# Patient Record
Sex: Male | Born: 1945 | Race: White | Hispanic: No | Marital: Married | Smoking: Never smoker
Health system: Southern US, Community
[De-identification: ages and names within clinical notes are randomized; demographics above are authoritative.]

## PROBLEM LIST (undated history)

## (undated) DIAGNOSIS — I255 Ischemic cardiomyopathy: Secondary | ICD-10-CM

## (undated) DIAGNOSIS — I10 Essential (primary) hypertension: Secondary | ICD-10-CM

## (undated) DIAGNOSIS — M1611 Unilateral primary osteoarthritis, right hip: Secondary | ICD-10-CM

## (undated) DIAGNOSIS — I340 Nonrheumatic mitral (valve) insufficiency: Secondary | ICD-10-CM

## (undated) DIAGNOSIS — E785 Hyperlipidemia, unspecified: Secondary | ICD-10-CM

## (undated) DIAGNOSIS — J069 Acute upper respiratory infection, unspecified: Secondary | ICD-10-CM

## (undated) DIAGNOSIS — I2510 Atherosclerotic heart disease of native coronary artery without angina pectoris: Secondary | ICD-10-CM

## (undated) HISTORY — DX: Ischemic cardiomyopathy: I25.5

## (undated) HISTORY — DX: Nonrheumatic mitral (valve) insufficiency: I34.0

## (undated) HISTORY — PX: JOINT REPLACEMENT: SHX530

## (undated) HISTORY — PX: VASECTOMY: SHX75

## (undated) SURGERY — Surgical Case
Anesthesia: *Unknown

---
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
## 2011-08-15 ENCOUNTER — Encounter (HOSPITAL_COMMUNITY): Payer: Self-pay | Admitting: Pharmacy Technician

## 2011-08-17 ENCOUNTER — Encounter (HOSPITAL_COMMUNITY): Payer: Self-pay

## 2011-08-17 ENCOUNTER — Encounter (HOSPITAL_COMMUNITY)
Admission: RE | Admit: 2011-08-17 | Discharge: 2011-08-17 | Disposition: A | Discharge status: Home / Self Care | Payer: Medicare Other | Source: Ambulatory Visit | Attending: Orthopaedic Surgery | Admitting: Orthopaedic Surgery

## 2011-08-17 ENCOUNTER — Encounter (HOSPITAL_COMMUNITY)
Admission: RE | Admit: 2011-08-17 | Discharge: 2011-08-17 | Disposition: A | Discharge status: Home / Self Care | Payer: Medicare Other | Source: Ambulatory Visit | Attending: Orthopedic Surgery | Admitting: Orthopedic Surgery

## 2011-08-17 ENCOUNTER — Other Ambulatory Visit: Payer: Self-pay | Admitting: Orthopedic Surgery

## 2011-08-17 ENCOUNTER — Other Ambulatory Visit: Payer: Self-pay

## 2011-08-17 HISTORY — DX: Essential (primary) hypertension: I10

## 2011-08-17 HISTORY — DX: Acute upper respiratory infection, unspecified: J06.9

## 2011-08-17 LAB — COMPREHENSIVE METABOLIC PANEL
ALT: 13 U/L (ref 0–53)
Alkaline Phosphatase: 78 U/L (ref 39–117)
BUN: 26 mg/dL — ABNORMAL HIGH (ref 6–23)
CO2: 28 mEq/L (ref 19–32)
GFR calc Af Amer: >90 mL/min (ref >90)
GFR calc non Af Amer: 85 mL/min — ABNORMAL LOW (ref >90)
Glucose, Bld: 79 mg/dL (ref 70–99)
Potassium: 4.5 mEq/L (ref 3.5–5.1)
Sodium: 141 mEq/L (ref 135–145)
Total Bilirubin: 0.3 mg/dL (ref 0.3–1.2)

## 2011-08-17 LAB — CBC
HCT: 40.3 % (ref 39.0–52.0)
Hemoglobin: 13.9 g/dL (ref 13.0–17.0)
RBC: 4.43 MIL/uL (ref 4.22–5.81)

## 2011-08-17 LAB — URINALYSIS, ROUTINE W REFLEX MICROSCOPIC
Bilirubin Urine: NEGATIVE
Glucose, UA: NEGATIVE mg/dL
Nitrite: NEGATIVE
Specific Gravity, Urine: 1.031 — ABNORMAL HIGH (ref 1.005–1.030)
pH: 5.5 (ref 5.0–8.0)

## 2011-08-17 LAB — TYPE AND SCREEN

## 2011-08-17 LAB — DIFFERENTIAL
Basophils Absolute: 0 10*3/uL (ref 0.0–0.1)
Basophils Relative: 0 % (ref 0–1)
Eosinophils Absolute: 0.2 10*3/uL (ref 0.0–0.7)
Eosinophils Relative: 2 % (ref 0–5)
Neutrophils Relative %: 68 % (ref 43–77)

## 2011-08-17 LAB — PROTIME-INR
INR: 1.02 (ref 0.00–1.49)
Prothrombin Time: 13.6 seconds (ref 11.6–15.2)

## 2011-08-17 LAB — APTT: aPTT: 26 seconds (ref 24–37)

## 2011-08-17 NOTE — Pre-Procedure Instructions (Signed)
[name]  08/17/2011   Your procedure is scheduled on:  08-30-11 Tuesday  Report to [facility] at 6:45 AM.  Call this number if you have problems the morning of surgery: [phone]   Remember:   Do not eat food:After Midnight.  May have clear liquids: up to 4 Hours before arrival.  Clear liquids include soda, tea, black coffee, apple or grape juice, broth.  Take these medicines the morning of surgery with A SIP OF WATER: none   Do not wear jewelry, make-up or nail polish.  Do not wear lotions, powders, or perfumes. You may wear deodorant.  Do not shave 48 hours prior to surgery.  Do not bring valuables to the hospital.  Contacts, dentures or bridgework may not be worn into surgery.  Leave suitcase in the car. After surgery it may be brought to your room.  For patients admitted to the hospital, checkout time is 11:00 AM the day of discharge.   Patients discharged the day of surgery will not be allowed to drive home.  Name and phone number of your driver: paton crum - wife  [phone]  Special Instructions: Incentive Spirometry - Practice and bring it with you on the day of surgery. and CHG Shower Use Special Wash: 1/2 bottle night before surgery and 1/2 bottle morning of surgery.   Please read over the following fact sheets that you were given: Pain Booklet, Coughing and Deep Breathing, Blood Transfusion Information, MRSA Information and Surgical Site Infection Prevention

## 2011-08-18 LAB — URINE CULTURE
Colony Count: NO GROWTH
Culture  Setup Time: 201212051648

## 2011-08-24 NOTE — H&P (Addendum)
COMPLAINT:     Painful right hip.  HPI:     Leonard Rodriguez is a very pleasant 65 year old white married male who is seen today for evaluation of his right hip.  He has been having problems for at least the last three to four years in regards to his right hip.  He has had a considerable amount of pain in the right groin with some radiation down in the anterior thigh and down to his knee.  He denies any history of any injury or trauma, but certainly as a youth he was quite active.  He has had the pain in the area which is now moderately severe and more of an aching pain.  He is having difficulty now with sleeping and also with any type of activity.  He is not really able to golf or water ski secondary to this pain.  He feels as if he is having shortening of the right leg.  He tends to waddle some with walking.  He has had some benefit with rest and Aleve.  He is seen today for evaluation.  MEDICATIONS:     He is taking Aleve three in the morning, one at noon, and one in the evening.  He has been doing this for several years.    ALLERGIES:     He states no allergies at this time.  MEDICAL HISTORY:     The patient's health history has been reviewed and is well documented in the chart.  His general health is excellent.  Dr. Tenny Craw at [place] is his physician.    SURGICAL HISTORY:    He had a vasectomy in 1994 and that is the only surgical history he has had.    HOSPITALIZATIONS:    He has never been hospitalized.      ROS:    Fourteen point review of systems is unremarkable except for a history of marginal hypertension for the past two years.  He has not been placed on any medications.  He does also wear glasses.  FAMILY HISTORY:     Positive for mother who remains alive at age 50.  His father is deceased at age 88 from multiple areas of cancer.  He has a living brother at age 43 who is healthy.  He had one sister who died at age 29 from lung cancer.    SOCIAL HISTORY:     65 year old white married male  who is retired from [employer].  He denies the use of tobacco or alcohol.  EXAM:     Examination today reveals a very pleasant 65 year old white male who is well developed, well nourished, alert, pleasant, and cooperative.  He is in moderate distress secondary to right groin and thigh pain.  Height 6 feet 0 inches.  Weight 170 pounds.  Respiration rate is normal.   BP 165/85  Pulse 78  Temp(Src) 98.1 F (36.7 C) (Oral)  Resp 18  SpO2 97%   HEENT:   Normocephalic.  PERRLA.  EOM's intact.  Ears, nose and throat grossly benign.  Neck supple.  No bruits noted. Chest:   Good expansion.  Lungs clear. Heart:   Regular rate and rhythm.  Normal S1-S2.  No murmurs.  Pulses 2+ bilateral and symmetric. CNS: Oriented x 3.  Cranial nerves 2-12 grossly intact. Abdomen:   Scaphoid soft, non tender.  No masses palpable. Normal bowel sounds present.   Genitalia:    Not indicated. Rectal: Not indicated. Breast: Not indicated. Musculoskeletal:  Today he  has positive log roll.  He has minimal, if any, internal rotation.  He has about 15 to 20 degrees of external rotation on the right.  I can flex him to about 100 degrees and he tends to externally rotate at that time.  Internally rotating certainly worsens his symptoms.  He has a normal neurovascular exam.  Negative straight leg raise.  X-RAYS:     X-rays reviewed reveal significant osteoarthritis with multiple cysts in both the acetabulum and the femoral head.  Bone quality appears good at this time.  CLEARANCES:  At this time, I have reviewed documentation from [facility] and Dr. Tenny Craw feels that he is from a medical and cardiac standpoint cleared to undergo surgery.  PLAN:    Therefore, I have explained to him the entire procedure and the risks and benefits in detail.  The risks include that of infection, DVT and pulmonary embolus, fracture of the bone, death, heart attack, stroke, injury to nerves and vessels, as well as other  complications.  He would like to proceed with total hip replacement.  Will send for labs, EKG, and CXR and if acceptable will proceed.  Oris Drone Petrarca, PA-C 08/30/2011 8:20 AM

## 2011-08-29 MED ORDER — CHLORHEXIDINE GLUCONATE 4 % EX LIQD: 60.0000 mL | Freq: Once | CUTANEOUS | Status: DC

## 2011-08-29 MED ORDER — SODIUM CHLORIDE 0.9 % IV SOLN: INTRAVENOUS | Status: DC

## 2011-08-29 MED ORDER — ACETAMINOPHEN 10 MG/ML IV SOLN
1000.0000 mg | Freq: Once | INTRAVENOUS | Status: AC
  Administered 2011-08-30: 1000 mg via INTRAVENOUS
  Filled 2011-08-29: qty 100

## 2011-08-29 MED ORDER — CHLORHEXIDINE GLUCONATE 4 % EX LIQD: 60.0000 mL | Freq: Every day | CUTANEOUS | Status: DC

## 2011-08-30 ENCOUNTER — Inpatient Hospital Stay (HOSPITAL_COMMUNITY): Payer: Medicare Other | Admitting: Anesthesiology

## 2011-08-30 ENCOUNTER — Encounter (HOSPITAL_COMMUNITY): Payer: Self-pay | Admitting: Anesthesiology

## 2011-08-30 ENCOUNTER — Inpatient Hospital Stay (HOSPITAL_COMMUNITY)
Admission: RE | Admit: 2011-08-30 | Discharge: 2011-09-02 | DRG: 470 | Disposition: A | Discharge status: Home Health | Payer: Medicare Other | Source: Ambulatory Visit | Attending: Orthopaedic Surgery | Admitting: Orthopaedic Surgery

## 2011-08-30 ENCOUNTER — Encounter (HOSPITAL_COMMUNITY)
Admission: RE | Disposition: A | Discharge status: Home Health | Payer: Self-pay | Source: Ambulatory Visit | Attending: Orthopaedic Surgery

## 2011-08-30 DIAGNOSIS — I10 Essential (primary) hypertension: Secondary | ICD-10-CM | POA: Diagnosis present

## 2011-08-30 DIAGNOSIS — M169 Osteoarthritis of hip, unspecified: Principal | ICD-10-CM | POA: Diagnosis present

## 2011-08-30 DIAGNOSIS — M1610 Unilateral primary osteoarthritis, unspecified hip: Principal | ICD-10-CM | POA: Diagnosis present

## 2011-08-30 DIAGNOSIS — Z79899 Other long term (current) drug therapy: Secondary | ICD-10-CM

## 2011-08-30 DIAGNOSIS — D62 Acute posthemorrhagic anemia: Secondary | ICD-10-CM | POA: Diagnosis not present

## 2011-08-30 DIAGNOSIS — Z7901 Long term (current) use of anticoagulants: Secondary | ICD-10-CM

## 2011-08-30 HISTORY — PX: TOTAL HIP ARTHROPLASTY: SHX124

## 2011-08-30 SURGERY — ARTHROPLASTY, HIP, TOTAL,POSTERIOR APPROACH
Anesthesia: General | Site: Hip | Laterality: Right | Wound class: Clean

## 2011-08-30 MED ORDER — ONDANSETRON HCL 4 MG/2ML IJ SOLN: 4.0000 mg | Freq: Four times a day (QID) | INTRAMUSCULAR | Status: DC | PRN

## 2011-08-30 MED ORDER — HYDROMORPHONE HCL PF 1 MG/ML IJ SOLN
0.2500 mg | INTRAMUSCULAR | Status: DC | PRN
Start: 2011-08-30 — End: 2011-08-30
  Administered 2011-08-30: 0.5 mg via INTRAVENOUS

## 2011-08-30 MED ORDER — FENTANYL CITRATE 0.05 MG/ML IJ SOLN
INTRAMUSCULAR | Status: DC | PRN
  Administered 2011-08-30: 50 ug via INTRAVENOUS
  Administered 2011-08-30: 250 ug via INTRAVENOUS
  Administered 2011-08-30 (×2): 50 ug via INTRAVENOUS

## 2011-08-30 MED ORDER — METHOCARBAMOL 100 MG/ML IJ SOLN
500.0000 mg | INTRAVENOUS | Status: AC
  Administered 2011-08-30 (×2): 500 mg via INTRAVENOUS
  Filled 2011-08-30: qty 5

## 2011-08-30 MED ORDER — SODIUM CHLORIDE 0.9 % IR SOLN
Status: DC | PRN
  Administered 2011-08-30: 1000 mL

## 2011-08-30 MED ORDER — DIPHENHYDRAMINE HCL 12.5 MG/5ML PO ELIX: 12.5000 mg | ORAL_SOLUTION | Freq: Four times a day (QID) | ORAL | Status: DC | PRN

## 2011-08-30 MED ORDER — PROMETHAZINE HCL 25 MG/ML IJ SOLN: 6.2500 mg | INTRAMUSCULAR | Status: DC | PRN

## 2011-08-30 MED ORDER — ACETAMINOPHEN 10 MG/ML IV SOLN
1000.0000 mg | Freq: Four times a day (QID) | INTRAVENOUS | Status: AC
  Administered 2011-08-30 – 2011-08-31 (×3): 1000 mg via INTRAVENOUS
  Filled 2011-08-30 (×4): qty 100

## 2011-08-30 MED ORDER — BISACODYL 10 MG RE SUPP: 10.0000 mg | Freq: Every day | RECTAL | Status: DC | PRN

## 2011-08-30 MED ORDER — KETOROLAC TROMETHAMINE 30 MG/ML IJ SOLN
30.0000 mg | Freq: Four times a day (QID) | INTRAMUSCULAR | Status: AC
  Administered 2011-08-30 – 2011-08-31 (×2): 30 mg via INTRAVENOUS
  Filled 2011-08-30 (×2): qty 1

## 2011-08-30 MED ORDER — METHOCARBAMOL 500 MG PO TABS
500.0000 mg | ORAL_TABLET | Freq: Four times a day (QID) | ORAL | Status: DC | PRN
  Administered 2011-08-31 – 2011-09-01 (×2): 500 mg via ORAL
  Filled 2011-08-30 (×2): qty 1

## 2011-08-30 MED ORDER — GLYCOPYRROLATE 0.2 MG/ML IJ SOLN
INTRAMUSCULAR | Status: DC | PRN
  Administered 2011-08-30: 0.2 mg via INTRAVENOUS

## 2011-08-30 MED ORDER — NALOXONE HCL 0.4 MG/ML IJ SOLN: 0.4000 mg | INTRAMUSCULAR | Status: DC | PRN

## 2011-08-30 MED ORDER — ROCURONIUM BROMIDE 100 MG/10ML IV SOLN
INTRAVENOUS | Status: DC | PRN
  Administered 2011-08-30: 50 mg via INTRAVENOUS

## 2011-08-30 MED ORDER — PHENOL 1.4 % MT LIQD
1.0000 | OROMUCOSAL | Status: DC | PRN
  Filled 2011-08-30: qty 177

## 2011-08-30 MED ORDER — ACETAMINOPHEN 10 MG/ML IV SOLN
INTRAVENOUS | Status: AC
  Filled 2011-08-30: qty 100

## 2011-08-30 MED ORDER — ONDANSETRON HCL 4 MG PO TABS: 4.0000 mg | ORAL_TABLET | Freq: Four times a day (QID) | ORAL | Status: DC | PRN

## 2011-08-30 MED ORDER — OXYCODONE HCL 5 MG PO TABS: 5.0000 mg | ORAL_TABLET | ORAL | Status: DC | PRN

## 2011-08-30 MED ORDER — ONDANSETRON HCL 4 MG/2ML IJ SOLN
INTRAMUSCULAR | Status: DC | PRN
  Administered 2011-08-30: 4 mg via INTRAVENOUS

## 2011-08-30 MED ORDER — CEFAZOLIN SODIUM 1-5 GM-% IV SOLN
1.0000 g | Freq: Four times a day (QID) | INTRAVENOUS | Status: AC
  Administered 2011-08-30 – 2011-08-31 (×3): 1 g via INTRAVENOUS
  Filled 2011-08-30 (×3): qty 50

## 2011-08-30 MED ORDER — CEFAZOLIN SODIUM 1-5 GM-% IV SOLN
1.0000 g | Freq: Once | INTRAVENOUS | Status: AC
  Administered 2011-08-30: 1 g via INTRAVENOUS
  Filled 2011-08-30: qty 50

## 2011-08-30 MED ORDER — MENTHOL 3 MG MT LOZG: 1.0000 | LOZENGE | OROMUCOSAL | Status: DC | PRN

## 2011-08-30 MED ORDER — LACTATED RINGERS IV SOLN
INTRAVENOUS | Status: DC | PRN
  Administered 2011-08-30 (×3): via INTRAVENOUS

## 2011-08-30 MED ORDER — RIVAROXABAN 10 MG PO TABS
10.0000 mg | ORAL_TABLET | ORAL | Status: DC
  Administered 2011-08-30 – 2011-09-01 (×3): 10 mg via ORAL
  Filled 2011-08-30 (×4): qty 1

## 2011-08-30 MED ORDER — MEPERIDINE HCL 25 MG/ML IJ SOLN: 6.2500 mg | INTRAMUSCULAR | Status: DC | PRN

## 2011-08-30 MED ORDER — SODIUM CHLORIDE 0.9 % IV SOLN
INTRAVENOUS | Status: DC
  Administered 2011-08-30: 19:00:00 via INTRAVENOUS

## 2011-08-30 MED ORDER — BUPIVACAINE-EPINEPHRINE 0.5% -1:200000 IJ SOLN
INTRAMUSCULAR | Status: DC | PRN
  Administered 2011-08-30: 30 mL

## 2011-08-30 MED ORDER — ALUM & MAG HYDROXIDE-SIMETH 200-200-20 MG/5ML PO SUSP: 30.0000 mL | ORAL | Status: DC | PRN

## 2011-08-30 MED ORDER — METOCLOPRAMIDE HCL 5 MG/ML IJ SOLN
5.0000 mg | Freq: Three times a day (TID) | INTRAMUSCULAR | Status: DC | PRN
  Filled 2011-08-30: qty 2

## 2011-08-30 MED ORDER — PROPOFOL 10 MG/ML IV EMUL
INTRAVENOUS | Status: DC | PRN
  Administered 2011-08-30: 150 mg via INTRAVENOUS

## 2011-08-30 MED ORDER — METHOCARBAMOL 100 MG/ML IJ SOLN
500.0000 mg | Freq: Four times a day (QID) | INTRAVENOUS | Status: DC | PRN
  Administered 2011-08-30: 500 mg via INTRAVENOUS

## 2011-08-30 MED ORDER — EPHEDRINE SULFATE 50 MG/ML IJ SOLN
INTRAMUSCULAR | Status: DC | PRN
  Administered 2011-08-30: 10 mg via INTRAVENOUS

## 2011-08-30 MED ORDER — HYDROMORPHONE 0.3 MG/ML IV SOLN
INTRAVENOUS | Status: DC
  Administered 2011-08-30: 7.5 mg via INTRAVENOUS

## 2011-08-30 MED ORDER — NEOSTIGMINE METHYLSULFATE 1 MG/ML IJ SOLN
INTRAMUSCULAR | Status: DC | PRN
  Administered 2011-08-30: 1.5 mg via INTRAVENOUS

## 2011-08-30 MED ORDER — CEFAZOLIN SODIUM 1-5 GM-% IV SOLN
INTRAVENOUS | Status: AC
  Filled 2011-08-30: qty 50

## 2011-08-30 MED ORDER — SODIUM CHLORIDE 0.9 % IJ SOLN: 9.0000 mL | INTRAMUSCULAR | Status: DC | PRN

## 2011-08-30 MED ORDER — RIVAROXABAN 10 MG PO TABS: 10.0000 mg | ORAL_TABLET | ORAL | Status: DC

## 2011-08-30 MED ORDER — DIPHENHYDRAMINE HCL 50 MG/ML IJ SOLN: 12.5000 mg | Freq: Four times a day (QID) | INTRAMUSCULAR | Status: DC | PRN

## 2011-08-30 MED ORDER — MIDAZOLAM HCL 5 MG/5ML IJ SOLN
INTRAMUSCULAR | Status: DC | PRN
  Administered 2011-08-30: 2 mg via INTRAVENOUS

## 2011-08-30 MED ORDER — METOCLOPRAMIDE HCL 10 MG PO TABS: 5.0000 mg | ORAL_TABLET | Freq: Three times a day (TID) | ORAL | Status: DC | PRN

## 2011-08-30 MED ORDER — DOCUSATE SODIUM 100 MG PO CAPS
100.0000 mg | ORAL_CAPSULE | Freq: Two times a day (BID) | ORAL | Status: DC
  Administered 2011-08-30 – 2011-09-02 (×6): 100 mg via ORAL
  Filled 2011-08-30 (×7): qty 1

## 2011-08-30 SURGICAL SUPPLY — 59 items
BLADE SAW SAG 73X25 THK (BLADE) ×1
BLADE SAW SGTL 73X25 THK (BLADE) ×1 IMPLANT
BRUSH FEMORAL CANAL (MISCELLANEOUS) IMPLANT
CLOTH BEACON ORANGE TIMEOUT ST (SAFETY) ×2 IMPLANT
COVER BACK TABLE 24X17X13 BIG (DRAPES) IMPLANT
COVER SURGICAL LIGHT HANDLE (MISCELLANEOUS) ×2 IMPLANT
DRAPE INCISE IOBAN 66X45 STRL (DRAPES) ×2 IMPLANT
DRAPE ORTHO SPLIT 77X108 STRL (DRAPES) ×2
DRAPE SURG ORHT 6 SPLT 77X108 (DRAPES) ×2 IMPLANT
DRSG MEPILEX BORDER 4X12 (GAUZE/BANDAGES/DRESSINGS) ×2 IMPLANT
DRSG MEPILEX BORDER 4X4 (GAUZE/BANDAGES/DRESSINGS) ×2 IMPLANT
DRSG MEPILEX BORDER 4X8 (GAUZE/BANDAGES/DRESSINGS) ×2 IMPLANT
DURAPREP 26ML APPLICATOR (WOUND CARE) ×2 IMPLANT
ELECT BLADE 6.5 EXT (BLADE) IMPLANT
ELECT REM PT RETURN 9FT ADLT (ELECTROSURGICAL) ×2
ELECTRODE REM PT RTRN 9FT ADLT (ELECTROSURGICAL) ×1 IMPLANT
EVACUATOR 1/8 PVC DRAIN (DRAIN) IMPLANT
FACESHIELD LNG OPTICON STERILE (SAFETY) ×6 IMPLANT
GLOVE BIO SURGEON STRL SZ7 (GLOVE) ×4 IMPLANT
GLOVE BIOGEL PI IND STRL 6.5 (GLOVE) ×2 IMPLANT
GLOVE BIOGEL PI IND STRL 8 (GLOVE) ×2 IMPLANT
GLOVE BIOGEL PI IND STRL 8.5 (GLOVE) IMPLANT
GLOVE BIOGEL PI INDICATOR 6.5 (GLOVE) ×2
GLOVE BIOGEL PI INDICATOR 8 (GLOVE) ×2
GLOVE BIOGEL PI INDICATOR 8.5 (GLOVE)
GLOVE ECLIPSE 8.0 STRL XLNG CF (GLOVE) ×4 IMPLANT
GLOVE SURG ORTHO 8.5 STRL (GLOVE) ×4 IMPLANT
GOWN PREVENTION PLUS XLARGE (GOWN DISPOSABLE) ×4 IMPLANT
GOWN STRL NON-REIN LRG LVL3 (GOWN DISPOSABLE) ×4 IMPLANT
HANDPIECE INTERPULSE COAX TIP (DISPOSABLE)
IMMOBILIZER KNEE 20 (SOFTGOODS)
IMMOBILIZER KNEE 20 THIGH 36 (SOFTGOODS) IMPLANT
IMMOBILIZER KNEE 22 UNIV (SOFTGOODS) IMPLANT
IMMOBILIZER KNEE 24 THIGH 36 (MISCELLANEOUS) IMPLANT
IMMOBILIZER KNEE 24 UNIV (MISCELLANEOUS)
KIT BASIN OR (CUSTOM PROCEDURE TRAY) ×2 IMPLANT
KIT ROOM TURNOVER OR (KITS) ×2 IMPLANT
MANIFOLD NEPTUNE II (INSTRUMENTS) ×2 IMPLANT
NEEDLE 22X1 1/2 (OR ONLY) (NEEDLE) ×2 IMPLANT
NS IRRIG 1000ML POUR BTL (IV SOLUTION) ×2 IMPLANT
PACK TOTAL JOINT (CUSTOM PROCEDURE TRAY) ×2 IMPLANT
PAD ARMBOARD 7.5X6 YLW CONV (MISCELLANEOUS) ×4 IMPLANT
PRESSURIZER FEMORAL UNIV (MISCELLANEOUS) IMPLANT
SET HNDPC FAN SPRY TIP SCT (DISPOSABLE) IMPLANT
STAPLER VISISTAT 35W (STAPLE) IMPLANT
SUCTION FRAZIER TIP 10 FR DISP (SUCTIONS) ×2 IMPLANT
SUT BONE WAX W31G (SUTURE) IMPLANT
SUT ETHIBOND NAB CT1 #1 30IN (SUTURE) ×6 IMPLANT
SUT MNCRL AB 3-0 PS2 18 (SUTURE) ×4 IMPLANT
SUT VIC AB 0 CT1 27 (SUTURE) ×3
SUT VIC AB 0 CT1 27XBRD ANBCTR (SUTURE) ×3 IMPLANT
SUT VIC AB 2-0 CT1 27 (SUTURE) ×1
SUT VIC AB 2-0 CT1 TAPERPNT 27 (SUTURE) ×1 IMPLANT
SYR CONTROL 10ML LL (SYRINGE) ×2 IMPLANT
TOWEL OR 17X24 6PK STRL BLUE (TOWEL DISPOSABLE) ×2 IMPLANT
TOWEL OR 17X26 10 PK STRL BLUE (TOWEL DISPOSABLE) ×2 IMPLANT
TOWER CARTRIDGE SMART MIX (DISPOSABLE) IMPLANT
TRAY FOLEY CATH 14FR (SET/KITS/TRAYS/PACK) ×2 IMPLANT
WATER STERILE IRR 1000ML POUR (IV SOLUTION) ×4 IMPLANT

## 2011-08-30 NOTE — Progress Notes (Signed)
Patient ID: Leonard Rodriguez, male   DOB: 12/01/1945, 65 y.o.   MRN: [id number] PATIENT ID:      Leonard Rodriguez  MRN:     [id number] DOB/AGE:    Nov 18, 1945 / 65 y.o.       OPERATIVE REPORT   DATE OF PROCEDURE:  08/30/2011       PREOPERATIVE DIAGNOSIS:   Osteoarthritis right hip                                                       There is no height or weight on file to calculate BMI.     POSTOPERATIVE DIAGNOSIS:   Osteoarthritis right hip                                                                     There is no height or weight on file to calculate BMI.     PROCEDURE:  Procedure(s):RIGHT TOTAL HIP ARTHROPLASTY     SURGEON: Norlene Campbell, MD  08/30/2011, 11:01 AM    ASSISTANT:   Jacqualine Code, PA-C   (Present and scrubbed throughout the case, critical for assistance with exposure, retraction, instrumentation, and closure.)          ANESTHESIA: General      DRAINS: none    COMPLICATIONS:  None        COMPONENTS:  DePuy AML small stature15 -mm femoral component         A 36  -mm  outer diameter hip ball          A 8.5 mm neck length                               A 58-mm outer  diameter sector IIl Porocoat acetabular shell with an apex hole eliminator, a single 3mmx6.5mm acetabular screw          A pinnacle Marathon polyethylene liner +4, 10-degree posterior lip.           Components were Press-Fit.      PROCEDURE IN DETAIL:  The patient was met in the holding area and personally  identified.  The appropriate hip was identified and marked at the operative site.The patient was then transported to room #1 and placed under  General anesthesia. A foley catheter was inserted by the nursing staff. Urine was clear.  At that point, the patient was placed in the lateral decubitus position with the operative side up and secured to the operating room table with the Innomed hip system.      The operative lower extremity was prepped from the iliac crest to the distal leg with Betadine scrub  and then DuraPrep.  Sterile draping was performed.      A routine southern incision was utilized and via sharp dissection carried down to the subcutaneous tissue.  Gross bleeders were Bovie coagulated.  The iliotibial band was identified and incised along the length of the skin incision.  Self-retaining retractors were   inserted.  With the hip internally rotated, the short  external rotators were identified. Tendinous structures were tagged with 0 Ethibond suture.  The hip capsule was identified and incised along the femoral neck and head.  There was a small clear yellow joint effusion.  Hip was easily dislocated posteriorly.  The femoral neck was then osteotomized using a calcar guide and removed from the wound.  Synovectomy was  performed from the acetabulum. Sciatic nerve was identified and protected throughout the procedure.      The osteotomy was placed about 10 mm proximal to the lesser trochanter.  A starter hole was then made through the piriformis fossa.  Reaming was performed to the appropriate size.  I had nice endosteal  purchase.  Rasping was performed sequentially to the  appropriate.      Retractors were then placed about the acetabulum.  Further synovectomy was performed.  There was a large degenerative labrum that was also excised.  Retractor was placed about the acetabulum.  Reaming was performed sequentially 57 mm. It had very nice bleeding circumferentially and a nice strong thick acetabulum.  I then trialed the acetabular component.  The 56mm component completely seated with a tight rim fit. The 58mm component would not completely seat .  Accordingly ,the final 58mm acetabular component was press fit without motion and perfectly stable.       The trial polyethylene liner was inserted followed by the femoral rasp.  We trialed a     number of neck lengths and felt like the 8.68mm trial neck and ball was the most stable.  At that point, there was minimal toggling and complete stability.   Leg lengths were appropriate.     The trial components were then removed.  The joint was copiously irrigated with saline solution.  Apex hole eliminator was inserted into the acetabular component followed by the final Marathon polyethylene liner. I inserted a single 13mmx6.5mm acetabular screw without palpable extrusion posteriorly.     The femoral component was then impacted onto the calcar.  Wound was again   irrigated.  We again trialed using the appropriate head and felt that it was perfectly stable.      The trial head was then removed.  We cleaned the [facility] taper neck and inserted the final head.  This was reduced, and through a full range of motion, it was perfectly  Stable  and there was no subluxation.  There was no evidence of instability.  It had a very nice construct.      Wound was then irrigated with saline solution.  The capsule was closed anatomically with #1 Ethibond.  The short external rotators were closed with similar material.  The wound was again irrigated with saline solution.  The iliotibial band was closed with  running #1 Vicryl, subcu was closed with 2-0 Vicryl and 3-0 Monocryl, skin was closed with skin clips.  Sterile bulky dressing was applied followed by a knee immobilizer.  The patient was then placed in the supine position, awoken, placed on the operating  stretcher, and returned to the  postanesthesia recovery room in satisfactory condition.     Norlene Campbell, MD  08/30/2011, 11:01 AM  08/30/2011 11:01 AM

## 2011-08-30 NOTE — Progress Notes (Signed)
Patient ID: BRODYN DEPUY, male   DOB: 1946/08/19, 65 y.o.   MRN: [id number] PATIENT ID:      KOHLTON GILPATRICK  MRN:     [id number] DOB/AGE:    [birth year] / 65 y.o.       OPERATIVE REPORT    DATE OF PROCEDURE:  08/30/2011       PREOPERATIVE DIAGNOSIS:   Osteoarthritis right hip                                                       There is no height or weight on file to calculate BMI.     POSTOPERATIVE DIAGNOSIS:   Osteoarthritis right hip                                                                     There is no height or weight on file to calculate BMI.     PROCEDURE:  Procedure(s):RIGHT TOTAL HIP ARTHROPLASTY     SURGEON: Ramaj Frangos W    ASSISTANT:   Jacqualine Code, PA-C   (Present and scrubbed throughout the case, critical for assistance with exposure, retraction, instrumentation, and closure.)          ANESTHESIA: General      DRAINS: none :      TOURNIQUET TIME: * No tourniquets in log *    COMPLICATIONS:  None   CONDITION:  stable  PROCEDURE IN DETAIL:dictated   Leyna Vanderkolk W 08/30/2011, 10:57 AM

## 2011-08-30 NOTE — Anesthesia Postprocedure Evaluation (Signed)
  Anesthesia Post-op Note  Patient: Leonard Rodriguez  Procedure(s) Performed:  TOTAL HIP ARTHROPLASTY  Patient Location: PACU  Anesthesia Type: General  Level of Consciousness: awake, alert  and oriented  Airway and Oxygen Therapy: Patient Spontanous Breathing and Patient connected to nasal cannula oxygen  Post-op Pain: mild  Post-op Assessment: Post-op Vital signs reviewed, Patient's Cardiovascular Status Stable, Respiratory Function Stable, Patent Airway and No signs of Nausea or vomiting  Post-op Vital Signs: Reviewed and stable  Complications: No apparent anesthesia complications

## 2011-08-30 NOTE — Anesthesia Preprocedure Evaluation (Addendum)
Anesthesia Evaluation  Patient identified by MRN, date of birth, ID band Patient awake    Reviewed: Allergy & Precautions, H&P , NPO status , Patient's Chart, lab work & pertinent test results  Airway Mallampati: II TM Distance: >3 FB Neck ROM: Full    Dental   Pulmonary          Cardiovascular     Neuro/Psych    GI/Hepatic   Endo/Other    Renal/GU      Musculoskeletal  (+) Arthritis -, Osteoarthritis,    Abdominal   Peds  Hematology   Anesthesia Other Findings   Reproductive/Obstetrics                          Anesthesia Physical Anesthesia Plan  ASA: I  Anesthesia Plan: General   Post-op Pain Management:    Induction: Intravenous  Airway Management Planned: Oral ETT  Additional Equipment:   Intra-op Plan:   Post-operative Plan: Extubation in OR  Informed Consent: I have reviewed the patients History and Physical, chart, labs and discussed the procedure including the risks, benefits and alternatives for the proposed anesthesia with the patient or authorized representative who has indicated his/her understanding and acceptance.   Dental advisory given  Plan Discussed with: CRNA, Anesthesiologist and Surgeon  Anesthesia Plan Comments:         Anesthesia Quick Evaluation

## 2011-08-30 NOTE — Preoperative (Signed)
Beta Blockers   Reason not to administer Beta Blockers:Not Applicable 

## 2011-08-30 NOTE — Brief Op Note (Signed)
PATIENT ID: ANZEL KEARSE  MRN: [id number]  DOB/AGE: Feb 16, 1946 / 65 y.o.  OPERATIVE REPORT  DATE OF PROCEDURE: 08/30/2011  PREOPERATIVE DIAGNOSIS: Osteoarthritis right hip  There is no height or weight on file to calculate BMI.  POSTOPERATIVE DIAGNOSIS: Osteoarthritis right hip There is no height or weight on file to calculate BMI.  PROCEDURE: Procedure(s):RIGHT  TOTAL HIP ARTHROPLASTY  SURGEON: Laron Angelini W  ASSISTANT: Jacqualine Code, PA-C (Present and scrubbed throughout the case, critical for assistance with exposure, retraction, instrumentation, and closure.)  ANESTHESIA: General  DRAINS: none :  TOURNIQUET TIME: * No tourniquets in log *  COMPLICATIONS: None  CONDITION: stable  PROCEDURE IN DETAIL:dictated  Deseree Zemaitis W  08/30/2011, 10:57 AM

## 2011-08-30 NOTE — Progress Notes (Signed)
Lunch relief by maria t.   rn

## 2011-08-30 NOTE — Op Note (Signed)
PATIENT ID: Leonard Rodriguez  MRN: [id number]  DOB/AGE: March 30, 1946 / 65 y.o.  OPERATIVE REPORT  DATE OF PROCEDURE: 08/30/2011  PREOPERATIVE DIAGNOSIS: Osteoarthritis right hip  There is no height or weight on file to calculate BMI.  POSTOPERATIVE DIAGNOSIS: Osteoarthritis right hip There is no height or weight on file to calculate BMI.  PROCEDURE: Procedure(s):RIGHT  TOTAL HIP ARTHROPLASTY  SURGEON: Norlene Campbell, MD  08/30/2011, 11:01 AM  ASSISTANT: Jacqualine Code, PA-C (Present and scrubbed throughout the case, critical for assistance with exposure, retraction, instrumentation, and closure.)  ANESTHESIA: General  DRAINS: none  COMPLICATIONS: None  COMPONENTS: DePuy AML small stature15 -mm femoral component  A 36 -mm outer diameter hip ball  A 8.5 mm neck length  A 58-mm outer diameter sector IIl Porocoat acetabular shell with an apex hole eliminator, a single 66mmx6.5mm acetabular screw  A pinnacle Marathon polyethylene liner +4, 10-degree posterior lip. Components were Press-Fit.  PROCEDURE IN DETAIL: The patient was met in the holding area and personally identified. The appropriate hip was identified and marked at the operative site.The patient was then transported to room #1 and placed under General anesthesia. A foley catheter was inserted by the nursing staff. Urine was clear. At that point, the patient was placed in the lateral decubitus position with the operative side up and secured to the operating room table with the Innomed hip system.  The operative lower extremity was prepped from the iliac crest to the distal leg with Betadine scrub and then DuraPrep. Sterile draping was performed.  A routine southern incision was utilized and via sharp dissection carried down to the subcutaneous tissue. Gross bleeders were Bovie coagulated. The iliotibial band was identified and incised along the length of the skin incision. Self-retaining retractors were  inserted. With the hip internally  rotated, the short external rotators were identified. Tendinous structures were tagged with 0 Ethibond suture. The hip capsule was identified and incised along the femoral neck and head. There was a small clear yellow joint effusion. Hip was easily dislocated posteriorly. The femoral neck was then osteotomized using a calcar guide and removed from the wound. Synovectomy was performed from the acetabulum. Sciatic nerve was identified and protected throughout the procedure.  The osteotomy was placed about 10 mm proximal to the lesser trochanter. A starter hole was then made through the piriformis fossa. Reaming was performed to the appropriate size. I had nice endosteal purchase. Rasping was performed sequentially to the appropriate.  Retractors were then placed about the acetabulum. Further synovectomy was performed. There was a large degenerative labrum that was also excised. Retractor was placed about the acetabulum. Reaming was performed sequentially 57 mm. It had very nice bleeding circumferentially and a nice strong thick acetabulum. I then trialed the acetabular component. The 56mm component completely seated with a tight rim fit. The 58mm component would not completely seat . Accordingly ,the final 58mm acetabular component was press fit without motion and perfectly stable.  The trial polyethylene liner was inserted followed by the femoral rasp. We trialed a number of neck lengths and felt like the 8.40mm trial neck and ball was the most stable. At that point, there was minimal toggling and complete stability. Leg lengths were appropriate.  The trial components were then removed. The joint was copiously irrigated with saline solution. Apex hole eliminator was inserted into the acetabular component followed by the final Marathon polyethylene liner. I inserted a single 24mmx6.5mm acetabular screw without palpable extrusion posteriorly.  The femoral component was then impacted  onto the calcar. Wound was again  irrigated. We again trialed using the appropriate head and felt that it was perfectly stable.  The trial head was then removed. We cleaned the [facility] taper neck and inserted the final head. This was reduced, and through a full range of motion, it was perfectly Stable and there was no subluxation. There was no evidence of instability. It had a very nice construct.  Wound was then irrigated with saline solution. The capsule was closed anatomically with #1 Ethibond. The short external rotators were closed with similar material. The wound was again irrigated with saline solution. The iliotibial band was closed with running #1 Vicryl, subcu was closed with 2-0 Vicryl and 3-0 Monocryl, skin was closed with skin clips. Sterile bulky dressing was applied followed by a knee immobilizer. The patient was then placed in the supine position, awoken, placed on the operating stretcher, and returned to the postanesthesia recovery room in satisfactory condition.  Norlene Campbell, MD  08/30/2011, 11:01 AM  08/30/2011 11:01 AM

## 2011-08-30 NOTE — Transfer of Care (Signed)
Immediate Anesthesia Transfer of Care Note  Patient: Leonard Rodriguez  Procedure(s) Performed:  TOTAL HIP ARTHROPLASTY  Patient Location: PACU  Anesthesia Type: General  Level of Consciousness: awake and alert   Airway & Oxygen Therapy: Patient Spontanous Breathing  Post-op Assessment: Report given to PACU RN  Post vital signs: Reviewed and stable  Complications: No apparent anesthesia complications

## 2011-08-31 ENCOUNTER — Encounter (HOSPITAL_COMMUNITY): Payer: Self-pay | Admitting: Orthopaedic Surgery

## 2011-08-31 LAB — CBC
HCT: 31.7 % — ABNORMAL LOW (ref 39.0–52.0)
Hemoglobin: 10.5 g/dL — ABNORMAL LOW (ref 13.0–17.0)
MCH: 29.6 pg (ref 26.0–34.0)
MCV: 89.3 fL (ref 78.0–100.0)
RBC: 3.55 MIL/uL — ABNORMAL LOW (ref 4.22–5.81)

## 2011-08-31 LAB — BASIC METABOLIC PANEL
BUN: 11 mg/dL (ref 6–23)
CO2: 28 mEq/L (ref 19–32)
Calcium: 8.1 mg/dL — ABNORMAL LOW (ref 8.4–10.5)
Glucose, Bld: 118 mg/dL — ABNORMAL HIGH (ref 70–99)
Sodium: 138 mEq/L (ref 135–145)

## 2011-08-31 MED ORDER — ACETAMINOPHEN 10 MG/ML IV SOLN
1000.0000 mg | Freq: Four times a day (QID) | INTRAVENOUS | Status: AC
  Administered 2011-08-31 – 2011-09-01 (×4): 1000 mg via INTRAVENOUS
  Filled 2011-08-31 (×4): qty 100

## 2011-08-31 NOTE — Progress Notes (Signed)
Patient ID: Leonard Rodriguez, male   DOB: Jun 27, 1946, 65 y.o.   MRN: [id number] PATIENT ID:      Leonard Rodriguez  MRN:     [id number] DOB/AGE:    [birth year] / 65 y.o.    PROGRESS NOTE Subjective:  negative for Chest Pain  negative for Shortness of Breath  negative for Nausea/Vomiting   negative for Calf Pain  negative for Bowel Movement   Tolerating Diet: yes         Patient reports pain as mild.    Objective: Vital signs in last 24 hours:  Patient Vitals for the past 24 hrs:  BP Temp Temp src Pulse Resp SpO2  08/31/11 0604 149/73 mmHg 98.8 F (37.1 C) - 78  18  96 %  08/31/11 0218 142/67 mmHg 98.5 F (36.9 C) - 96  19  97 %  08/30/11 2212 184/77 mmHg 98.4 F (36.9 C) - 88  18  94 %  08/30/11 1501 154/71 mmHg 97.9 F (36.6 C) Oral 79  18  97 %  08/30/11 1439 144/68 mmHg 98.5 F (36.9 C) - 85  21  99 %  08/30/11 1345 - - - 62  15  98 %  08/30/11 1337 147/63 mmHg - - - - -  08/30/11 1320 - - - - - 98 %  08/30/11 1245 - 97 F (36.1 C) - 56  11  98 %  08/30/11 1237 150/121 mmHg - - - - -  08/30/11 1230 - - - 58  18  98 %  08/30/11 1222 141/61 mmHg - - - - -  08/30/11 1215 - - - 58  14  98 %  08/30/11 1207 142/85 mmHg - - - - -  08/30/11 1200 - - - 56  - -  08/30/11 1157 - - - - 15  99 %  08/30/11 1152 160/58 mmHg - - - - -  08/30/11 1145 - - - 53  15  99 %  08/30/11 1137 144/54 mmHg - - - - -  08/30/11 1130 - - - 54  14  99 %  08/30/11 1124 154/58 mmHg - - - - -  08/30/11 1120 - 98.1 F (36.7 C) - - - -      Intake/Output from previous day:   12/18 0701 - 12/19 0700 In: 3940 [P.O.:890; I.V.:2900] Out: 3275 [Urine:3075]   Intake/Output this shift:       Intake/Output      12/18 0701 - 12/19 0700 12/19 0701 - 12/20 0700   P.O. 890    I.V. 2900    IV Piggyback 150    Total Intake 3940    Urine 3075    Blood 200    Total Output 3275    Net +665            LABORATORY DATA:  Basename 08/31/11 0555  WBC 11.0*  HGB 10.5*  HCT 31.7*  PLT 162    Basename  08/31/11 0555  NA 138  K 3.8  CL 104  CO2 28  BUN 11  CREATININE 0.94  GLUCOSE 118*  CALCIUM 8.1*   Lab Results  Component Value Date   INR 1.02 08/17/2011    Examination:  General appearance: alert, cooperative and no distress  Wound Exam: clean, dry, intact   Drainage:  None: wound tissue dry  Motor Exam EHL, FHL, Anterior Tibial and Posterior Tibial Intact  Sensory Exam Superficial Peroneal, Deep Peroneal and Tibial normal  Assessment:  1 Day Post-Op  Procedure(s) (LRB): TOTAL HIP ARTHROPLASTY (Right)  ADDITIONAL DIAGNOSIS:  Principal Problem:  *Osteoarthritis of hip  Acute Blood Loss Anemia   Plan: Physical Therapy as ordered Weight Bearing as Tolerated (WBAT)  DVT Prophylaxis:  Xarelto  DISCHARGE PLAN: Home  DISCHARGE NEEDS: HHPT, Walker and 3-in-1 comode seat         Juvon Teater W 08/31/2011, 7:56 AM

## 2011-08-31 NOTE — Progress Notes (Signed)
Patient ID: Leonard Rodriguez, male   DOB: 03/30/1946, 65 y.o.   MRN: [id number] PATIENT ID:      Leonard Rodriguez  MRN:     [id number] DOB/AGE:    [birth year] / 64 y.o.    PROGRESS NOTE Subjective:  negative for Chest Pain  negative for Shortness of Breath  negative for Nausea/Vomiting   negative for Calf Pain  negative for Bowel Movement   Tolerating Diet: yes         Patient reports pain as 0 on 0-10 scale.    Objective: Vital signs in last 24 hours:  Patient Vitals for the past 24 hrs:  BP Temp Temp src Pulse Resp SpO2  08/31/11 0604 149/73 mmHg 98.8 F (37.1 C) - 78  18  96 %  08/31/11 0218 142/67 mmHg 98.5 F (36.9 C) - 96  19  97 %  08/30/11 2212 184/77 mmHg 98.4 F (36.9 C) - 88  18  94 %  08/30/11 1501 154/71 mmHg 97.9 F (36.6 C) Oral 79  18  97 %  08/30/11 1439 144/68 mmHg 98.5 F (36.9 C) - 85  21  99 %  08/30/11 1345 - - - 62  15  98 %  08/30/11 1337 147/63 mmHg - - - - -  08/30/11 1320 - - - - - 98 %  08/30/11 1245 - 97 F (36.1 C) - 56  11  98 %  08/30/11 1237 150/121 mmHg - - - - -  08/30/11 1230 - - - 58  18  98 %  08/30/11 1222 141/61 mmHg - - - - -  08/30/11 1215 - - - 58  14  98 %  08/30/11 1207 142/85 mmHg - - - - -  08/30/11 1200 - - - 56  - -  08/30/11 1157 - - - - 15  99 %  08/30/11 1152 160/58 mmHg - - - - -  08/30/11 1145 - - - 53  15  99 %  08/30/11 1137 144/54 mmHg - - - - -  08/30/11 1130 - - - 54  14  99 %  08/30/11 1124 154/58 mmHg - - - - -  08/30/11 1120 - 98.1 F (36.7 C) - - - -      Intake/Output from previous day:   12/18 0701 - 12/19 0700 In: 3940 [P.O.:890; I.V.:2900] Out: 3275 [Urine:3075]   Intake/Output this shift:       Intake/Output      12/18 0701 - 12/19 0700 12/19 0701 - 12/20 0700   P.O. 890    I.V. 2900    IV Piggyback 150    Total Intake 3940    Urine 3075    Blood 200    Total Output 3275    Net +665            LABORATORY DATA:  Basename 08/31/11 0555  WBC 11.0*  HGB 10.5*  HCT 31.7*  PLT 162     Basename 08/31/11 0555  NA 138  K 3.8  CL 104  CO2 28  BUN 11  CREATININE 0.94  GLUCOSE 118*  CALCIUM 8.1*   Lab Results  Component Value Date   INR 1.02 08/17/2011    Examination:  General appearance: alert, cooperative and no distress Resp: clear to auscultation bilaterally Cardio: regular rate and rhythm GI: soft, non-tender; bowel sounds normal; no masses,  no organomegaly Extremities: Homans sign is negative, no sign of DVT  Wound Exam: clean, dry,  intact   Drainage:  None: wound tissue dry  Motor Exam EHL, FHL, Anterior Tibial and Posterior Tibial Intact  Sensory Exam Superficial Peroneal, Deep Peroneal and Tibial normal  Assessment:    1 Day Post-Op  Procedure(s) (LRB): TOTAL HIP ARTHROPLASTY (Right)  ADDITIONAL DIAGNOSIS:  Principal Problem:  *Osteoarthritis of hip  Acute Blood Loss Anemia   Plan: Physical Therapy as ordered Weight Bearing as Tolerated (WBAT)  Continue Ofirmev, wean off O2 & IV's  DVT Prophylaxis:  Xarelto  DISCHARGE PLAN: Home  DISCHARGE NEEDS: HHPT, Walker and 3-in-1 comode seat         Jomaira Darr 08/31/2011, 7:19 AM

## 2011-08-31 NOTE — Progress Notes (Signed)
Physical Therapy Evaluation Patient Details Name: Leonard Rodriguez MRN: [id number] DOB: [birth year] Today's Date: 08/31/2011  Problem List:  Patient Active Problem List  Diagnoses  . Osteoarthritis of hip    Past Medical History:  Past Medical History  Diagnosis Date  . Hypertension     put on bp meds. when he was heavier and was not exercising  . Recurrent upper respiratory infection (URI)     current cold being treated with otc meds.  . Arthritis     arthritis in right hip   Past Surgical History:  Past Surgical History  Procedure Date  . Vasectomy     PT Assessment/Plan/Recommendation PT Assessment Clinical Impression Statement: Pt is 65 yo male with R THA who is tolerating mobility very well at this point.  Recommend HHPT at d/c, equip has already been delivered to his home.  PT Recommendation/Assessment: Patient will need skilled PT in the acute care venue PT Problem List: Decreased strength;Decreased mobility;Decreased coordination;Decreased knowledge of use of DME;Decreased knowledge of precautions Barriers to Discharge: None PT Therapy Diagnosis : Abnormality of gait PT Plan PT Frequency: 7X/week PT Treatment/Interventions: DME instruction;Gait training;Stair training;Functional mobility training;Therapeutic activities;Therapeutic exercise;Balance training;Patient/family education PT Recommendation Recommendations for Other Services: OT consult Follow Up Recommendations: Home health PT Equipment Recommended: None recommended by PT PT Goals  Acute Rehab PT Goals PT Goal Formulation: With patient Time For Goal Achievement: 7 days Pt will go Supine/Side to Sit: with modified independence;with HOB 0 degrees PT Goal: Supine/Side to Sit - Progress: Progressing toward goal Pt will go Sit to Supine/Side: with modified independence;with HOB 0 degrees PT Goal: Sit to Supine/Side - Progress: Progressing toward goal Pt will go Sit to Stand: with modified independence PT  Goal: Sit to Stand - Progress: Progressing toward goal Pt will go Stand to Sit: with modified independence PT Goal: Stand to Sit - Progress: Progressing toward goal Pt will Transfer Bed to Chair/Chair to Bed: with modified independence;Other (comment) (with RW) PT Transfer Goal: Bed to Chair/Chair to Bed - Progress: Progressing toward goal Pt will Ambulate: >150 feet;with modified independence;with rolling walker PT Goal: Ambulate - Progress: Progressing toward goal Pt will Go Up / Down Stairs: 1-2 stairs;with rolling walker;with modified independence PT Goal: Up/Down Stairs - Progress: Progressing toward goal Pt will Perform Home Exercise Program: with supervision, verbal cues required/provided PT Goal: Perform Home Exercise Program - Progress: Progressing toward goal Additional Goals Additional Goal #1: Pt will verbalize 3/3 posterior THA precautions and keep with mobility PT Goal: Additional Goal #1 - Progress: Progressing toward goal  PT Evaluation Precautions/Restrictions  Precautions Precautions: Posterior Hip Precaution Booklet Issued: Yes (comment) Precaution Comments: precautions reviewed with pt and taped onto closet door Required Braces or Orthoses: Yes Knee Immobilizer: Other (comment) (in bed) Restrictions Weight Bearing Restrictions: Yes RLE Weight Bearing: Weight bearing as tolerated Prior Functioning  Home Living Lives With: Spouse;Daughter;Son [place] Help From: Family Type of Home: House Home Layout: One level Home Access: Stairs to enter Entrance Stairs-Rails: None Entrance Stairs-Number of Steps: 1 Bathroom Toilet: Standard Bathroom Accessibility: Yes How Accessible: Accessible via walker Home Adaptive Equipment: Walker - rolling;Bedside commode/3-in-1 (just delivered before surgery) Prior Function Level of Independence: Independent with basic ADLs;Independent with homemaking with ambulation;Independent with gait;Independent with transfers Able to Take  Stairs?: Reciprically Driving: Yes Vocation: Retired Leisure: Hobbies-yes (Comment) Comments: very active, pt works out regularly at [employer] Arousal/Alertness: Awake/alert Overall Cognitive Status: Appears within functional limits for tasks assessed Orientation Level: Oriented X4  Sensation/Coordination Sensation Light Touch: Appears Intact Stereognosis: Not tested Hot/Cold: Not tested Proprioception: Appears Intact Coordination Gross Motor Movements are Fluid and Coordinated: No Fine Motor Movements are Fluid and Coordinated: Yes Coordination and Movement Description: decreased ability to move right leg Extremity Assessment RUE Assessment RUE Assessment: Within Functional Limits LUE Assessment LUE Assessment: Within Functional Limits RLE Assessment RLE Assessment: Exceptions to [facility] RLE Strength RLE Overall Strength Comments: not formally tested today due to surgery but pt unable to lift leg against gravity currently, can abd/add it LLE Assessment LLE Assessment: Within Functional Limits Mobility (including Balance) Bed Mobility Bed Mobility: Yes Rolling Left: 4: Min assist Rolling Left Details (indicate cue type and reason): min A given to right leg to support and protect Left Sidelying to Sit: 4: Min assist;HOB flat;With rails Left Sidelying to Sit Details (indicate cue type and reason): min A to move right leg and to keep precautions Sitting - Scoot to Edge of Bed: 5: Supervision Sitting - Scoot to Edge of Bed Details (indicate cue type and reason): pt able to scoot to EOB in his own Transfers Transfers: Yes Sit to Stand: 4: Min assist;With upper extremity assist;From bed Sit to Stand Details (indicate cue type and reason): vc's for hand placement with RW and min A given to steady as pt initially puts wt through RLE Stand to Sit: 4: Min assist;To chair/3-in-1;With armrests Stand to Sit Details: vc's for hand placement and to slide foot out to decrease  pressure at right hip, min A given for safety Ambulation/Gait Ambulation/Gait: Yes Ambulation/Gait Assistance: 4: Min assist (min-guard A) Ambulation/Gait Assistance Details (indicate cue type and reason): vc's given to help correct posture as pre-existing gait abnormality apparent with ambulation.  vc's to lead with RLE. Ambulation Distance (Feet): 180 Feet Assistive device: Rolling walker Gait Pattern: Decreased weight shift to right;Left hip hike;Lateral trunk lean to right Stairs: No Wheelchair Mobility Wheelchair Mobility: No  Posture/Postural Control Posture/Postural Control: No significant limitations Balance Balance Assessed: No Exercise  Total Joint Exercises Ankle Circles/Pumps: AROM;Both;20 reps;Seated Quad Sets: Both;10 reps;AROM;Strengthening;Seated Gluteal Sets: AROM;Strengthening;Both;10 reps;Seated Heel Slides: AROM;Strengthening;Right;10 reps;Seated (trunk in extended position to keep precautions) Hip ABduction/ADduction: AROM;Right;Strengthening;10 reps;Seated Long Arc Quad: AROM;Strengthening;Right;10 reps;Seated End of Session PT - End of Session Equipment Utilized During Treatment: Gait belt Activity Tolerance: Patient tolerated treatment well Patient left: in chair;with call bell in reach Nurse Communication: Mobility status for ambulation General Behavior During Session: [facility] for tasks performed Cognition: [facility] for tasks performed  Yarethzi Branan, [place]  [phone] 08/31/2011, 10:55 AM

## 2011-09-01 ENCOUNTER — Inpatient Hospital Stay (HOSPITAL_COMMUNITY): Payer: Medicare Other

## 2011-09-01 DIAGNOSIS — D62 Acute posthemorrhagic anemia: Secondary | ICD-10-CM | POA: Diagnosis not present

## 2011-09-01 LAB — CBC
MCH: 29.8 pg (ref 26.0–34.0)
MCV: 89.6 fL (ref 78.0–100.0)
Platelets: 167 10*3/uL (ref 150–400)
RBC: 3.36 MIL/uL — ABNORMAL LOW (ref 4.22–5.81)

## 2011-09-01 LAB — BASIC METABOLIC PANEL
CO2: 27 mEq/L (ref 19–32)
Calcium: 8.4 mg/dL (ref 8.4–10.5)
Creatinine, Ser: 0.86 mg/dL (ref 0.50–1.35)
Glucose, Bld: 114 mg/dL — ABNORMAL HIGH (ref 70–99)

## 2011-09-01 MED ORDER — RIVAROXABAN 10 MG PO TABS: 10.0000 mg | ORAL_TABLET | Freq: Every day | ORAL | Status: DC

## 2011-09-01 MED ORDER — HYDROCODONE-ACETAMINOPHEN 5-325 MG PO TABS
1.0000 | ORAL_TABLET | ORAL | Status: DC | PRN
  Administered 2011-09-01: 2 via ORAL
  Filled 2011-09-01: qty 2

## 2011-09-01 MED ORDER — HYDROCODONE-ACETAMINOPHEN 5-325 MG PO TABS: 1.0000 | ORAL_TABLET | ORAL | Status: AC | PRN

## 2011-09-01 MED ORDER — METHOCARBAMOL 500 MG PO TABS: 500.0000 mg | ORAL_TABLET | Freq: Four times a day (QID) | ORAL | Status: AC | PRN

## 2011-09-01 NOTE — Discharge Summary (Signed)
PATIENT ID:      Leonard Rodriguez  MRN:     [id number] DOB/AGE:    04/15/1946 / 65 y.o.     DISCHARGE SUMMARY  ADMISSION DATE:    08/30/2011 DISCHARGE DATE:   09/02/2011   ADMISSION DIAGNOSIS: OA right hip  (Osteoarthritis right hip)  DISCHARGE DIAGNOSIS:  Osteoarthritis right hip    ADDITIONAL DIAGNOSIS: Principal Problem:  *Osteoarthritis of hip Active Problems:  Postoperative anemia due to acute blood loss  Past Medical History  Diagnosis Date  . Hypertension     put on bp meds. when he was heavier and was not exercising  . Recurrent upper respiratory infection (URI)     current cold being treated with otc meds.  . Arthritis     arthritis in right hip    PROCEDURE: Procedure(s): TOTAL HIP ARTHROPLASTY on 08/30/2011  CONSULTS:  NONE    HISTORY: Leonard Rodriguez is a very pleasant 65 year old white married male who is seen today for evaluation of his right hip. He has been having problems for at least the last three to four years in regards to his right hip. He has had a considerable amount of pain in the right groin with some radiation down in the anterior thigh and down to his knee. He denies any history of any injury or trauma, but certainly as a youth he was quite active. He has had the pain in the area which is now moderately severe and more of an aching pain. He is having difficulty now with sleeping and also with any type of activity. He is not really able to golf or water ski secondary to this pain. He feels as if he is having shortening of the right leg. He tends to waddle some with walking. He has had some benefit with rest and Aleve.   HOSPITAL COURSE:  Leonard Rodriguez is a 65 y.o. admitted on 08/30/2011 and found to have a diagnosis of Osteoarthritis right hip.  After appropriate laboratory studies were obtained  they were taken to the operating room on 08/30/2011 and underwent Procedure(s): TOTAL HIP ARTHROPLASTY.   They were given perioperative antibiotics:  Anti-infectives    Start     Dose/Rate Route Frequency Ordered Stop   08/30/11 1615   ceFAZolin (ANCEF) IVPB 1 g/50 mL premix        1 g 100 mL/hr over 30 Minutes Intravenous Every 6 hours 08/30/11 1512 08/31/11 0500   08/30/11 0830   ceFAZolin (ANCEF) IVPB 1 g/50 mL premix        1 g 100 mL/hr over 30 Minutes Intravenous  Once 08/30/11 0824 08/30/11 0904        . Blood products given:none  On POD#1, the foley was discontinued and the IV saline locked.  PCA was discontinued.  O2 was weaned off the patient.  PT was begun as per protocol.  On POD#2, the dressing was changed.  PT was continued. Did have elevation in temperature to 101.2 but has returned to normal.  Had liquid stool this AM.  No dysuria, shakes or chill.  No SOB, CP, or calf pain.  The remainder of the hospital course was dedicated to ambulation and strengthening.   The patient was discharged on 3 Days Post-Op in  Stable condition.   DIAGNOSTIC STUDIES: Recent vital signs:  Patient Vitals for the past 24 hrs:  BP Temp Temp src Pulse Resp SpO2  09/02/11 0533 143/68 mmHg 98 F (36.7 C) Oral 73  20  98 %  09/02/11 0255 - 99.1 F (37.3 C) - - - -  09/13/2011 2340 - 100.8 F (38.2 C) - - - -  09/13/11 2330 - 100.8 F (38.2 C) - - - -  09/13/11 2207 - 101.2 F (38.4 C) Oral 101  17  95 %  13-Sep-2011 1400 164/71 mmHg 98.7 F (37.1 C) - 80  18  98 %       Recent laboratory studies:  Basename 09/02/11 0530 09-13-2011 0638 08/31/11 0555  WBC 12.7* 12.4* 11.0*  HGB 9.3* 10.0* 10.5*  HCT 27.3* 30.1* 31.7*  PLT 152 167 162    Basename 09/02/11 0530 13-Sep-2011 0638 08/31/11 0555  NA 138 137 138  K 4.1 4.2 3.8  CL 105 105 104  CO2 29 27 28   BUN 11 10 11   CREATININE 0.84 0.86 0.94  GLUCOSE 109* 114* 118*  CALCIUM 8.5 8.4 8.1*   Lab Results  Component Value Date   INR 1.02 08/17/2011     Recent Radiographic Studies :   Chest 2 View  08/17/2011  *RADIOLOGY REPORT*  Clinical Data: Preop  CHEST - 2 VIEW  Comparison: None  Findings: The  lungs are clear.  Mediastinal contours appear normal. The heart is within normal limits in size.  No bony abnormality is seen.  IMPRESSION: No active lung disease.  Original Report Authenticated By: Juline Patch, M.D.   Dg Pelvis Portable  13-Sep-2011  *RADIOLOGY REPORT*  Clinical Data: Post right hip replacement.  PORTABLE PELVIS  Comparison: None.  Findings: Post total right hip replacement which appears in satisfactory position.  Mild left hip joint degenerative changes.  IMPRESSION: Post total right hip replacement which appears in satisfactory position.  Original Report Authenticated By: Fuller Canada, M.D.   Dg Hip Portable 1 View Right  13-Sep-2011  *RADIOLOGY REPORT*  Clinical Data: Status post total hip arthroplasty  PORTABLE RIGHT HIP - 1 VIEW  Comparison: None  Findings: The hardware components of a right total hip arthroplasty are identified. Normal anatomic alignment.  No evidence for peri prostatic fracture or dislocation.  IMPRESSION: No complicating features identified after right total hip arthroplasty.  Original Report Authenticated By: Rosealee Albee, M.D.    DISCHARGE INSTRUCTIONS: Discharge Orders    Future Orders Please Complete By Expires   Diet general      Call MD / Call 911      Comments:   If you experience chest pain or shortness of breath, CALL 911 and be transported to the hospital emergency room.  If you develope a fever above 101 F, pus (white drainage) or increased drainage or redness at the wound, or calf pain, call your surgeon's office.   Constipation Prevention      Comments:   Drink plenty of fluids.  Prune juice may be helpful.  You may use a stool softener, such as Colace (over the counter) 100 mg twice a day.  Use MiraLax (over the counter) for constipation as needed.   Increase activity slowly as tolerated      Weight Bearing as taught in Physical Therapy      Comments:   Use a walker or crutches as instructed in Physical Therapy (PT)   Patient  may shower      Comments:   You may shower without a dressing once there is no drainage.  Do not wash over the wound.  If drainage remains, cover wound with plastic wrap and then shower.   Driving restrictions  Comments:   No driving for 6 weeks   Lifting restrictions      Comments:   No lifting for 6 weeks   Follow the hip precautions as taught in Physical Therapy      Change dressing      Comments:   You may change your dressing on SATURDAY, then change the dressing daily with sterile 4 x 4 inch gauze dressing and paper tape.  You may clean the incision with alcohol prior to redressing   TED hose      Comments:   Use stockings (TED hose) for 3 weeks on RIGHT leg(s).  You may remove them at night for sleeping.      DISCHARGE MEDICATIONS:  Current Discharge Medication List    START taking these medications   Details  HYDROcodone-acetaminophen (NORCO) 5-325 MG per tablet Take 1-2 tablets by mouth every 4 (four) hours as needed. Qty: 60 tablet, Refills: 0    methocarbamol (ROBAXIN) 500 MG tablet Take 1 tablet (500 mg total) by mouth every 6 (six) hours as needed. Qty: 40 tablet, Refills: 0    rivaroxaban (XARELTO) 10 MG TABS tablet Take 1 tablet (10 mg total) by mouth daily with supper. Qty: 30 tablet, Refills: 0      CONTINUE these medications which have NOT CHANGED   Details  Iron-Vitamins (GERITOL COMPLETE) TABS Take 1 tablet by mouth daily.        STOP taking these medications     fish oil-omega-3 fatty acids 1000 MG capsule      HYALURONIC ACID-VITAMIN C PO      Misc Natural Products (OSTEO BI-FLEX JOINT SHIELD PO)      naproxen sodium (ANAPROX) 220 MG tablet      OVER THE COUNTER MEDICATION      OVER THE COUNTER MEDICATION         FOLLOW UP VISIT:   Follow-up Information    Follow up with [facility] on 09/14/2011. (PLEASE CALL FOR APPT IF NOT ALREADY MADE)    Contact information:   [address] [place]  [address] [phone]          DISPOSITION:  Home  Final discharge disposition not confirmed  DIET:  diet as tolerated   CONDITION:  Stable   PETRARCA,BRIAN 09/02/2011, 7:51 AM

## 2011-09-01 NOTE — Progress Notes (Signed)
   CARE MANAGEMENT NOTE 09/01/2011  Patient:  Leonard Rodriguez, Leonard Rodriguez   Account Number:  [id number]  Date Initiated:  09/01/2011  Documentation initiated by:  GRAVES-BIGELOW,Jamylah Marinaccio  Subjective/Objective Assessment:   Pt s/p RIGHT TOTAL HIP ARTHROPLASTY. Plan for home with [facility] services.     Action/Plan:   Anticipated DC Date:  09/01/2011   Anticipated DC Plan:  HOME/SELF CARE      DC Planning Services  CM consult      PAC Choice  DURABLE MEDICAL EQUIPMENT   Choice offered to / List presented to:  C-1 Patient   DME arranged  3-N-1  Levan Hurst      DME agency  [facility]     HH arranged  HH-2 PT      [facility] The agency  [facility]   Status of service:  Completed, signed off Medicare Important Message given?   (If response is "NO", the following Medicare IM given date fields will be blank) Date Medicare IM given:   Date Additional Medicare IM given:    Discharge Disposition:  HOME/SELF CARE  Per UR Regulation:    Comments:  09-01-11 [address] Tomi Bamberger, RN,BSN [phone] CM called [facility] pharmacy for xarelto and they have 25 pills available for pick up. CM made pt aware. Pharmacy will be able to order overnight and have in store next day. Pt is set up with [facility] for services. SOC to begin within 2-48 hours post d/c.

## 2011-09-01 NOTE — Progress Notes (Signed)
Physical Therapy Treatment Patient Details Name: Leonard Rodriguez MRN: [id number] DOB: Oct 13, 1945 Today's Date: 09/01/2011  PT Assessment/Plan  PT - Assessment/Plan Comments on Treatment Session: Pt progressing extremely well. Pt has ambulated in halls several times today with family PT Plan: Discharge plan remains appropriate PT Frequency: 7X/week Follow Up Recommendations: Home health PT Equipment Recommended: None recommended by PT PT Goals  Acute Rehab PT Goals PT Goal: Supine/Side to Sit - Progress: Met PT Goal: Sit to Supine/Side - Progress: Met PT Goal: Sit to Stand - Progress: Met PT Goal: Stand to Sit - Progress: Met PT Transfer Goal: Bed to Chair/Chair to Bed - Progress: Met PT Goal: Ambulate - Progress: Met PT Goal: Up/Down Stairs - Progress: Progressing toward goal PT Goal: Perform Home Exercise Program - Progress: Met Additional Goals PT Goal: Additional Goal #1 - Progress: Met  PT Treatment Precautions/Restrictions  Precautions Precautions: Posterior Hip Precaution Booklet Issued: Yes (comment) Precaution Comments: Pt able to recall all precautions Required Braces or Orthoses: Yes Knee Immobilizer: Other (comment) (in bed) Restrictions Weight Bearing Restrictions: Yes RLE Weight Bearing: Weight bearing as tolerated Mobility (including Balance) Bed Mobility Left Sidelying to Sit: 6: Modified independent (Device/Increase time) Sitting - Scoot to Edge of Bed: 6: Modified independent (Device/Increase time) Sit to Supine - Left: 6: Modified independent (Device/Increase time) Transfers Sit to Stand: 6: Modified independent (Device/Increase time);From bed;From chair/3-in-1;With upper extremity assist Stand to Sit: 6: Modified independent (Device/Increase time);To chair/3-in-1;To bed;With upper extremity assist Ambulation/Gait Ambulation/Gait Assistance: 6: Modified independent (Device/Increase time) Ambulation Distance (Feet): 300 Feet Assistive device: Rolling  walker Gait Pattern: Within Functional Limits;Step-through pattern Stairs Assistance: 5: Supervision Stair Management Technique: No rails;With walker;Backwards Number of Stairs: 1  (Practiced twice)    Exercise  Total Joint Exercises Quad Sets: AROM;15 reps;Both;Supine Gluteal Sets: AROM;Right;Both;Other reps (comment);Supine (15) Heel Slides: AROM;Right;15 reps;Supine Hip ABduction/ADduction: AROM;Right;15 reps;Supine;Standing Long Arc Quad: AROM;Right;15 reps;Seated Knee Flexion: AROM;Right;15 reps;Standing Standing Hip Extension: AROM;Right;Other reps (comment) (15) End of Session PT - End of Session Equipment Utilized During Treatment: Gait belt Activity Tolerance: Patient tolerated treatment well Patient left: in chair;with call bell in reach;with family/visitor present General Behavior During Session: [facility] for tasks performed Cognition: [facility] for tasks performed  Robinette, Adline Potter 09/01/2011, 12:12 PM 09/01/2011 Fredrich Birks PTA [phone] pager [phone] office

## 2011-09-01 NOTE — Progress Notes (Signed)
Occupational Therapy Evaluation Patient Details Name: Leonard Rodriguez MRN: [id number] DOB: [birth year] Today's Date: 09/01/2011  Problem List:  Patient Active Problem List  Diagnoses  . Osteoarthritis of hip    Past Medical History:  Past Medical History  Diagnosis Date  . Hypertension     put on bp meds. when he was heavier and was not exercising  . Recurrent upper respiratory infection (URI)     current cold being treated with otc meds.  . Arthritis     arthritis in right hip   Past Surgical History:  Past Surgical History  Procedure Date  . Vasectomy   . Total hip arthroplasty 08/30/2011    Procedure: TOTAL HIP ARTHROPLASTY;  Surgeon: Valeria Batman, MD;  Location: [facility] OR;  Service: Orthopedics;  Laterality: Right;    OT Assessment/Plan/Recommendation OT Assessment OT Recommendation/Assessment: Patient does not need any further OT services OT Recommendation Equipment Recommended: None recommended by OT OT Goals    OT Evaluation Precautions/Restrictions  Precautions Precautions: Posterior Hip Precaution Booklet Issued: Yes (comment) Precaution Comments: Pt able to recall all precautions Required Braces or Orthoses: No Knee Immobilizer: Other (comment) (in bed) Restrictions Weight Bearing Restrictions: Yes RLE Weight Bearing: Weight bearing as tolerated Prior Functioning Home Living Lives With: Spouse;Daughter;Son [place] Help From: Family Type of Home: House Home Layout: One level Home Access: Stairs to enter Entrance Stairs-Rails: None Entrance Stairs-Number of Steps: 1 Bathroom Shower/Tub: Counselling psychologist: Yes How Accessible: Accessible via walker Home Adaptive Equipment: Walker - rolling;Bedside commode/3-in-1 Prior Function Level of Independence: Independent with basic ADLs;Independent with homemaking with ambulation;Independent with gait;Independent with transfers Able to Take Stairs?:  Reciprically Driving: Yes Vocation: Retired ADL ADL Eating/Feeding: Performed;Independent Where Assessed - Eating/Feeding: Chair Lower Body Bathing: Simulated;Modified independent Where Assessed - Lower Body Bathing: Sit to stand from chair Lower Body Dressing: Simulated;Modified independent Where Assessed - Lower Body Dressing: Sit to stand from chair Toilet Transfer: Simulated;Modified independent Toilet Transfer Method: Proofreader: Raised toilet seat with arms (or 3-in-1 over toilet) Equipment Used: Long-handled sponge;Long-handled shoe horn;Reacher;Rolling walker;Sock aid ADL Comments: Pt educated on AE and plans to purchase sock aid. Pt has all other AE from church members through a borrowing system they have established. Pt has A of wife 24 / 7 at d/c as welll. Vision/Perception  Vision - History Baseline Vision: Bifocals Patient Visual Report: No change from baseline Cognition Cognition Arousal/Alertness: Awake/alert Overall Cognitive Status: Appears within functional limits for tasks assessed Orientation Level: Oriented X4 Sensation/Coordination Sensation Light Touch: Appears Intact Coordination Gross Motor Movements are Fluid and Coordinated: Yes Fine Motor Movements are Fluid and Coordinated: Yes Extremity Assessment RUE Assessment RUE Assessment: Within Functional Limits LUE Assessment LUE Assessment: Within Functional Limits Mobility  Bed Mobility Bed Mobility: No Left Sidelying to Sit: 6: Modified independent (Device/Increase time) Sitting - Scoot to Edge of Bed: 6: Modified independent (Device/Increase time) Sit to Supine - Left: 6: Modified independent (Device/Increase time) Transfers Transfers: Yes Sit to Stand: 6: Modified independent (Device/Increase time);With upper extremity assist;From chair/3-in-1 Stand to Sit: 6: Modified independent (Device/Increase time);With upper extremity assist;To chair/3-in-1 Exercises Total Joint  Exercises Quad Sets: AROM;15 reps;Both;Supine Gluteal Sets: AROM;Right;Both;Other reps (comment);Supine (15) Heel Slides: AROM;Right;15 reps;Supine Hip ABduction/ADduction: AROM;Right;15 reps;Supine;Standing Long Arc Quad: AROM;Right;15 reps;Seated Knee Flexion: AROM;Right;15 reps;Standing Standing Hip Extension: AROM;Right;Other reps (comment) (15) End of Session OT - End of Session Activity Tolerance: Patient tolerated treatment well Patient left: in chair;with call bell in reach General Behavior  During Session: [facility] for tasks performed Cognition: [facility] for tasks performed   Lucile Shutters 09/01/2011, 3:12 PM  Pager: [phone]

## 2011-09-01 NOTE — Progress Notes (Signed)
Patient referred to Child psychotherapist. Met with patient and wife. Patient plans return to home with home health, DME and support from his wife. He does not wish SNF placement. States that he has a very supportive family to assist as needed. No further SW intervention indicated.  Darylene Price, BSW, 09/01/2011 11:52 AM

## 2011-09-02 LAB — CBC
HCT: 27.3 % — ABNORMAL LOW (ref 39.0–52.0)
Hemoglobin: 9.3 g/dL — ABNORMAL LOW (ref 13.0–17.0)
RBC: 3.02 MIL/uL — ABNORMAL LOW (ref 4.22–5.81)
WBC: 12.7 10*3/uL — ABNORMAL HIGH (ref 4.0–10.5)

## 2011-09-02 LAB — BASIC METABOLIC PANEL
BUN: 11 mg/dL (ref 6–23)
CO2: 29 mEq/L (ref 19–32)
Chloride: 105 mEq/L (ref 96–112)
GFR calc non Af Amer: 90 mL/min — ABNORMAL LOW (ref >90)
Glucose, Bld: 109 mg/dL — ABNORMAL HIGH (ref 70–99)
Potassium: 4.1 mEq/L (ref 3.5–5.1)
Sodium: 138 mEq/L (ref 135–145)

## 2011-09-02 NOTE — Progress Notes (Signed)
Nursing note- Pt discharged to home with SO.  NAD noted and no changes from am assessment.  Denies any c/os.

## 2011-09-02 NOTE — Progress Notes (Signed)
Patient ID: EREK KOWAL, male   DOB: 16-Apr-1946, 65 y.o.   MRN: [id number] PATIENT ID:      ANGELDEJESUS CALLAHAM  MRN:     [id number] DOB/AGE:    07-14-1946 / 65 y.o.    PROGRESS NOTE Subjective:  negative for Chest Pain  negative for Shortness of Breath  negative for Nausea/Vomiting   negative for Calf Pain  positive for Bowel Movement   Tolerating Diet: yes         Patient reports pain as mild.    Objective: Vital signs in last 24 hours:  Patient Vitals for the past 24 hrs:  BP Temp Temp src Pulse Resp SpO2  09/02/11 0533 143/68 mmHg 98 F (36.7 C) Oral 73  20  98 %  09/02/11 0255 - 99.1 F (37.3 C) - - - -  09/01/11 2340 - 100.8 F (38.2 C) - - - -  09/01/11 2330 - 100.8 F (38.2 C) - - - -  09/01/11 2207 - 101.2 F (38.4 C) Oral 101  17  95 %  09/01/11 1400 164/71 mmHg 98.7 F (37.1 C) - 80  18  98 %      Intake/Output from previous day:   12/20 0701 - 12/21 0700 In: 580 [P.O.:480] Out: 243 [Urine:243]   Intake/Output this shift:       Intake/Output      12/20 0701 - 12/21 0700 12/21 0701 - 12/22 0700   P.O. 480    Other 100    Total Intake 580    Urine 243    Total Output 243    Net +337            LABORATORY DATA:  Basename 09/02/11 0530 09/01/11 0638 08/31/11 0555  WBC 12.7* 12.4* 11.0*  HGB 9.3* 10.0* 10.5*  HCT 27.3* 30.1* 31.7*  PLT 152 167 162    Basename 09/02/11 0530 09/01/11 0638 08/31/11 0555  NA 138 137 138  K 4.1 4.2 3.8  CL 105 105 104  CO2 29 27 28   BUN 11 10 11   CREATININE 0.84 0.86 0.94  GLUCOSE 109* 114* 118*  CALCIUM 8.5 8.4 8.1*   Lab Results  Component Value Date   INR 1.02 08/17/2011    Examination:  General appearance: alert, cooperative and mild distress Resp: clear to auscultation bilaterally Cardio: regular rate and rhythm GI: soft, non-tender; bowel sounds normal; no masses,  no organomegaly Extremities: Homans sign is negative, no sign of DVT  Wound Exam: clean, dry, intact   Drainage:  None: wound tissue  dry  Motor Exam EHL, FHL, Anterior Tibial and Posterior Tibial Intact  Sensory Exam Superficial Peroneal, Deep Peroneal and Tibial normal  Assessment:    3 Days Post-Op  Procedure(s) (LRB): TOTAL HIP ARTHROPLASTY (Right)  ADDITIONAL DIAGNOSIS:  Principal Problem:  *Osteoarthritis of hip Active Problems:  Postoperative anemia due to acute blood loss  Acute Blood Loss Anemia   Plan: Physical Therapy as ordered Weight Bearing as Tolerated (WBAT)  DVT Prophylaxis:  Xarelto and TED hose  DISCHARGE PLAN: Home  DISCHARGE NEEDS: HHPT, Walker and 3-in-1 comode seat         PETRARCA,BRIAN 09/02/2011, 7:57 AM

## 2011-09-03 DIAGNOSIS — R269 Unspecified abnormalities of gait and mobility: Secondary | ICD-10-CM | POA: Diagnosis not present

## 2011-09-03 DIAGNOSIS — Z471 Aftercare following joint replacement surgery: Secondary | ICD-10-CM | POA: Diagnosis not present

## 2011-09-03 DIAGNOSIS — IMO0001 Reserved for inherently not codable concepts without codable children: Secondary | ICD-10-CM | POA: Diagnosis not present

## 2011-09-03 DIAGNOSIS — Z96649 Presence of unspecified artificial hip joint: Secondary | ICD-10-CM | POA: Diagnosis not present

## 2011-09-07 DIAGNOSIS — R269 Unspecified abnormalities of gait and mobility: Secondary | ICD-10-CM | POA: Diagnosis not present

## 2011-09-07 DIAGNOSIS — IMO0001 Reserved for inherently not codable concepts without codable children: Secondary | ICD-10-CM | POA: Diagnosis not present

## 2011-09-07 DIAGNOSIS — Z96649 Presence of unspecified artificial hip joint: Secondary | ICD-10-CM | POA: Diagnosis not present

## 2011-09-07 DIAGNOSIS — Z471 Aftercare following joint replacement surgery: Secondary | ICD-10-CM | POA: Diagnosis not present

## 2011-09-08 DIAGNOSIS — IMO0001 Reserved for inherently not codable concepts without codable children: Secondary | ICD-10-CM | POA: Diagnosis not present

## 2011-09-08 DIAGNOSIS — Z471 Aftercare following joint replacement surgery: Secondary | ICD-10-CM | POA: Diagnosis not present

## 2011-09-08 DIAGNOSIS — R269 Unspecified abnormalities of gait and mobility: Secondary | ICD-10-CM | POA: Diagnosis not present

## 2011-09-08 DIAGNOSIS — Z96649 Presence of unspecified artificial hip joint: Secondary | ICD-10-CM | POA: Diagnosis not present

## 2011-09-09 DIAGNOSIS — Z471 Aftercare following joint replacement surgery: Secondary | ICD-10-CM | POA: Diagnosis not present

## 2011-09-09 DIAGNOSIS — Z96649 Presence of unspecified artificial hip joint: Secondary | ICD-10-CM | POA: Diagnosis not present

## 2011-09-09 DIAGNOSIS — IMO0001 Reserved for inherently not codable concepts without codable children: Secondary | ICD-10-CM | POA: Diagnosis not present

## 2011-09-09 DIAGNOSIS — R269 Unspecified abnormalities of gait and mobility: Secondary | ICD-10-CM | POA: Diagnosis not present

## 2011-09-12 DIAGNOSIS — IMO0001 Reserved for inherently not codable concepts without codable children: Secondary | ICD-10-CM | POA: Diagnosis not present

## 2011-09-12 DIAGNOSIS — R269 Unspecified abnormalities of gait and mobility: Secondary | ICD-10-CM | POA: Diagnosis not present

## 2011-09-12 DIAGNOSIS — Z471 Aftercare following joint replacement surgery: Secondary | ICD-10-CM | POA: Diagnosis not present

## 2011-09-12 DIAGNOSIS — Z96649 Presence of unspecified artificial hip joint: Secondary | ICD-10-CM | POA: Diagnosis not present

## 2011-09-13 DIAGNOSIS — Z96649 Presence of unspecified artificial hip joint: Secondary | ICD-10-CM | POA: Diagnosis not present

## 2011-09-13 DIAGNOSIS — Z471 Aftercare following joint replacement surgery: Secondary | ICD-10-CM | POA: Diagnosis not present

## 2011-09-13 DIAGNOSIS — R269 Unspecified abnormalities of gait and mobility: Secondary | ICD-10-CM | POA: Diagnosis not present

## 2011-09-13 DIAGNOSIS — IMO0001 Reserved for inherently not codable concepts without codable children: Secondary | ICD-10-CM | POA: Diagnosis not present

## 2011-09-14 DIAGNOSIS — R269 Unspecified abnormalities of gait and mobility: Secondary | ICD-10-CM | POA: Diagnosis not present

## 2011-09-14 DIAGNOSIS — Z96649 Presence of unspecified artificial hip joint: Secondary | ICD-10-CM | POA: Diagnosis not present

## 2011-09-14 DIAGNOSIS — IMO0001 Reserved for inherently not codable concepts without codable children: Secondary | ICD-10-CM | POA: Diagnosis not present

## 2011-09-14 DIAGNOSIS — Z471 Aftercare following joint replacement surgery: Secondary | ICD-10-CM | POA: Diagnosis not present

## 2011-09-16 DIAGNOSIS — R269 Unspecified abnormalities of gait and mobility: Secondary | ICD-10-CM | POA: Diagnosis not present

## 2011-09-16 DIAGNOSIS — IMO0001 Reserved for inherently not codable concepts without codable children: Secondary | ICD-10-CM | POA: Diagnosis not present

## 2011-09-16 DIAGNOSIS — Z96649 Presence of unspecified artificial hip joint: Secondary | ICD-10-CM | POA: Diagnosis not present

## 2011-09-16 DIAGNOSIS — Z471 Aftercare following joint replacement surgery: Secondary | ICD-10-CM | POA: Diagnosis not present

## 2011-09-21 DIAGNOSIS — M25559 Pain in unspecified hip: Secondary | ICD-10-CM | POA: Diagnosis not present

## 2011-09-21 DIAGNOSIS — Z96649 Presence of unspecified artificial hip joint: Secondary | ICD-10-CM | POA: Diagnosis not present

## 2011-09-28 DIAGNOSIS — Z96649 Presence of unspecified artificial hip joint: Secondary | ICD-10-CM | POA: Diagnosis not present

## 2011-09-30 DIAGNOSIS — M25559 Pain in unspecified hip: Secondary | ICD-10-CM | POA: Diagnosis not present

## 2011-09-30 DIAGNOSIS — Z96649 Presence of unspecified artificial hip joint: Secondary | ICD-10-CM | POA: Diagnosis not present

## 2011-10-05 DIAGNOSIS — Z96649 Presence of unspecified artificial hip joint: Secondary | ICD-10-CM | POA: Diagnosis not present

## 2011-10-05 DIAGNOSIS — M25559 Pain in unspecified hip: Secondary | ICD-10-CM | POA: Diagnosis not present

## 2011-10-12 DIAGNOSIS — M25559 Pain in unspecified hip: Secondary | ICD-10-CM | POA: Diagnosis not present

## 2011-10-12 DIAGNOSIS — Z96649 Presence of unspecified artificial hip joint: Secondary | ICD-10-CM | POA: Diagnosis not present

## 2011-10-14 DIAGNOSIS — M25559 Pain in unspecified hip: Secondary | ICD-10-CM | POA: Diagnosis not present

## 2011-10-14 DIAGNOSIS — M1610 Unilateral primary osteoarthritis, unspecified hip: Secondary | ICD-10-CM | POA: Diagnosis not present

## 2011-10-14 DIAGNOSIS — Z96649 Presence of unspecified artificial hip joint: Secondary | ICD-10-CM | POA: Diagnosis not present

## 2011-10-19 DIAGNOSIS — Z96649 Presence of unspecified artificial hip joint: Secondary | ICD-10-CM | POA: Diagnosis not present

## 2011-10-19 DIAGNOSIS — M25559 Pain in unspecified hip: Secondary | ICD-10-CM | POA: Diagnosis not present

## 2011-10-25 DIAGNOSIS — M25559 Pain in unspecified hip: Secondary | ICD-10-CM | POA: Diagnosis not present

## 2011-10-25 DIAGNOSIS — Z96649 Presence of unspecified artificial hip joint: Secondary | ICD-10-CM | POA: Diagnosis not present

## 2011-10-28 DIAGNOSIS — Z96649 Presence of unspecified artificial hip joint: Secondary | ICD-10-CM | POA: Diagnosis not present

## 2011-10-28 DIAGNOSIS — M25559 Pain in unspecified hip: Secondary | ICD-10-CM | POA: Diagnosis not present

## 2011-11-02 DIAGNOSIS — M25559 Pain in unspecified hip: Secondary | ICD-10-CM | POA: Diagnosis not present

## 2011-11-02 DIAGNOSIS — Z96649 Presence of unspecified artificial hip joint: Secondary | ICD-10-CM | POA: Diagnosis not present

## 2011-11-04 DIAGNOSIS — M25559 Pain in unspecified hip: Secondary | ICD-10-CM | POA: Diagnosis not present

## 2011-11-04 DIAGNOSIS — Z96649 Presence of unspecified artificial hip joint: Secondary | ICD-10-CM | POA: Diagnosis not present

## 2011-11-09 DIAGNOSIS — Z96649 Presence of unspecified artificial hip joint: Secondary | ICD-10-CM | POA: Diagnosis not present

## 2011-11-09 DIAGNOSIS — M25559 Pain in unspecified hip: Secondary | ICD-10-CM | POA: Diagnosis not present

## 2011-11-25 DIAGNOSIS — M1610 Unilateral primary osteoarthritis, unspecified hip: Secondary | ICD-10-CM | POA: Diagnosis not present

## 2012-05-25 DIAGNOSIS — M25559 Pain in unspecified hip: Secondary | ICD-10-CM | POA: Diagnosis not present

## 2012-10-20 DIAGNOSIS — R404 Transient alteration of awareness: Secondary | ICD-10-CM | POA: Diagnosis not present

## 2012-10-20 DIAGNOSIS — R55 Syncope and collapse: Secondary | ICD-10-CM | POA: Diagnosis not present

## 2013-06-10 DIAGNOSIS — M25559 Pain in unspecified hip: Secondary | ICD-10-CM | POA: Diagnosis not present

## 2014-05-20 DIAGNOSIS — I10 Essential (primary) hypertension: Secondary | ICD-10-CM | POA: Diagnosis not present

## 2014-11-12 ENCOUNTER — Other Ambulatory Visit (HOSPITAL_COMMUNITY): Payer: Self-pay

## 2014-11-12 ENCOUNTER — Encounter (HOSPITAL_COMMUNITY): Payer: Self-pay | Admitting: *Deleted

## 2014-11-12 ENCOUNTER — Emergency Department (HOSPITAL_COMMUNITY): Payer: Medicare Other

## 2014-11-12 ENCOUNTER — Encounter (HOSPITAL_COMMUNITY)
Admission: EM | Disposition: A | Discharge status: Home / Self Care | Payer: Self-pay | Source: Home / Self Care | Attending: Cardiovascular Disease

## 2014-11-12 ENCOUNTER — Inpatient Hospital Stay (HOSPITAL_COMMUNITY)
Admission: EM | Admit: 2014-11-12 | Discharge: 2014-11-14 | DRG: 247 | Disposition: A | Discharge status: Home / Self Care | Payer: Medicare Other | Attending: Cardiovascular Disease | Admitting: Cardiovascular Disease

## 2014-11-12 DIAGNOSIS — I340 Nonrheumatic mitral (valve) insufficiency: Secondary | ICD-10-CM | POA: Diagnosis present

## 2014-11-12 DIAGNOSIS — Z9861 Coronary angioplasty status: Secondary | ICD-10-CM

## 2014-11-12 DIAGNOSIS — I214 Non-ST elevation (NSTEMI) myocardial infarction: Principal | ICD-10-CM | POA: Diagnosis present

## 2014-11-12 DIAGNOSIS — E785 Hyperlipidemia, unspecified: Secondary | ICD-10-CM | POA: Diagnosis not present

## 2014-11-12 DIAGNOSIS — Z7982 Long term (current) use of aspirin: Secondary | ICD-10-CM | POA: Diagnosis not present

## 2014-11-12 DIAGNOSIS — R079 Chest pain, unspecified: Secondary | ICD-10-CM | POA: Diagnosis not present

## 2014-11-12 DIAGNOSIS — I2510 Atherosclerotic heart disease of native coronary artery without angina pectoris: Secondary | ICD-10-CM | POA: Diagnosis not present

## 2014-11-12 DIAGNOSIS — Z8679 Personal history of other diseases of the circulatory system: Secondary | ICD-10-CM | POA: Insufficient documentation

## 2014-11-12 DIAGNOSIS — I25110 Atherosclerotic heart disease of native coronary artery with unstable angina pectoris: Secondary | ICD-10-CM | POA: Diagnosis not present

## 2014-11-12 DIAGNOSIS — Z7901 Long term (current) use of anticoagulants: Secondary | ICD-10-CM

## 2014-11-12 DIAGNOSIS — M1990 Unspecified osteoarthritis, unspecified site: Secondary | ICD-10-CM | POA: Diagnosis present

## 2014-11-12 DIAGNOSIS — I213 ST elevation (STEMI) myocardial infarction of unspecified site: Secondary | ICD-10-CM | POA: Diagnosis not present

## 2014-11-12 DIAGNOSIS — Z96641 Presence of right artificial hip joint: Secondary | ICD-10-CM | POA: Diagnosis present

## 2014-11-12 DIAGNOSIS — I10 Essential (primary) hypertension: Secondary | ICD-10-CM | POA: Diagnosis not present

## 2014-11-12 HISTORY — PX: LEFT HEART CATHETERIZATION WITH CORONARY ANGIOGRAM: SHX5451

## 2014-11-12 HISTORY — DX: Hyperlipidemia, unspecified: E78.5

## 2014-11-12 HISTORY — DX: Unilateral primary osteoarthritis, right hip: M16.11

## 2014-11-12 HISTORY — PX: CORONARY ANGIOPLASTY WITH STENT PLACEMENT: SHX49

## 2014-11-12 HISTORY — DX: Atherosclerotic heart disease of native coronary artery without angina pectoris: I25.10

## 2014-11-12 LAB — BASIC METABOLIC PANEL
ANION GAP: 12 (ref 5–15)
BUN: 9 mg/dL (ref 6–23)
CALCIUM: 9.4 mg/dL (ref 8.4–10.5)
CO2: 26 mmol/L (ref 19–32)
CREATININE: 1.07 mg/dL (ref 0.50–1.35)
Chloride: 98 mmol/L (ref 96–112)
GFR calc Af Amer: 80 mL/min — ABNORMAL LOW (ref >90)
GFR, EST NON AFRICAN AMERICAN: 69 mL/min — AB (ref >90)
Glucose, Bld: 134 mg/dL — ABNORMAL HIGH (ref 70–99)
Potassium: 4.2 mmol/L (ref 3.5–5.1)
Sodium: 136 mmol/L (ref 135–145)

## 2014-11-12 LAB — APTT: aPTT: 25 seconds (ref 24–37)

## 2014-11-12 LAB — CREATININE, SERUM
CREATININE: 1.17 mg/dL (ref 0.50–1.35)
GFR calc Af Amer: 72 mL/min — ABNORMAL LOW (ref >90)
GFR calc non Af Amer: 62 mL/min — ABNORMAL LOW (ref >90)

## 2014-11-12 LAB — POCT ACTIVATED CLOTTING TIME
ACTIVATED CLOTTING TIME: 171 s
Activated Clotting Time: 423 seconds

## 2014-11-12 LAB — TROPONIN I
TROPONIN I: 54.85 ng/mL — AB (ref <0.031)
Troponin I: >80 ng/mL (ref <0.031)

## 2014-11-12 LAB — CBC
HEMATOCRIT: 45.2 % (ref 39.0–52.0)
HEMATOCRIT: 41.6 % (ref 39.0–52.0)
HEMOGLOBIN: 14.4 g/dL (ref 13.0–17.0)
Hemoglobin: 15.6 g/dL (ref 13.0–17.0)
MCH: 31.6 pg (ref 26.0–34.0)
MCH: 31.9 pg (ref 26.0–34.0)
MCHC: 34.5 g/dL (ref 30.0–36.0)
MCHC: 34.6 g/dL (ref 30.0–36.0)
MCV: 91.7 fL (ref 78.0–100.0)
MCV: 92.2 fL (ref 78.0–100.0)
Platelets: 224 10*3/uL (ref 150–400)
Platelets: 197 10*3/uL (ref 150–400)
RBC: 4.93 MIL/uL (ref 4.22–5.81)
RBC: 4.51 MIL/uL (ref 4.22–5.81)
RDW: 13.1 % (ref 11.5–15.5)
RDW: 13.3 % (ref 11.5–15.5)
WBC: 13.8 10*3/uL — ABNORMAL HIGH (ref 4.0–10.5)
WBC: 13.7 10*3/uL — AB (ref 4.0–10.5)

## 2014-11-12 LAB — HEPATIC FUNCTION PANEL
ALBUMIN: 4.1 g/dL (ref 3.5–5.2)
ALT: 39 U/L (ref 0–53)
AST: 182 U/L — ABNORMAL HIGH (ref 0–37)
Alkaline Phosphatase: 56 U/L (ref 39–117)
BILIRUBIN DIRECT: 0.2 mg/dL (ref 0.0–0.5)
Indirect Bilirubin: 1.3 mg/dL — ABNORMAL HIGH (ref 0.3–0.9)
TOTAL PROTEIN: 8 g/dL (ref 6.0–8.3)
Total Bilirubin: 1.5 mg/dL — ABNORMAL HIGH (ref 0.3–1.2)

## 2014-11-12 LAB — I-STAT TROPONIN, ED: TROPONIN I, POC: 9.83 ng/mL — AB (ref 0.00–0.08)

## 2014-11-12 LAB — LIPASE, BLOOD: Lipase: 26 U/L (ref 11–59)

## 2014-11-12 LAB — BRAIN NATRIURETIC PEPTIDE: B Natriuretic Peptide: 316.3 pg/mL — ABNORMAL HIGH (ref 0.0–100.0)

## 2014-11-12 SURGERY — LEFT HEART CATHETERIZATION WITH CORONARY ANGIOGRAM

## 2014-11-12 MED ORDER — NITROGLYCERIN 1 MG/10 ML FOR IR/CATH LAB
INTRA_ARTERIAL | Status: AC
  Filled 2014-11-12: qty 10

## 2014-11-12 MED ORDER — HEPARIN SODIUM (PORCINE) 5000 UNIT/ML IJ SOLN
5000.0000 [IU] | Freq: Three times a day (TID) | INTRAMUSCULAR | Status: DC
  Administered 2014-11-13 – 2014-11-14 (×3): 5000 [IU] via SUBCUTANEOUS
  Filled 2014-11-12 (×7): qty 1

## 2014-11-12 MED ORDER — SODIUM CHLORIDE 0.9 % IJ SOLN: 3.0000 mL | INTRAMUSCULAR | Status: DC | PRN

## 2014-11-12 MED ORDER — ADULT MULTIVITAMIN W/MINERALS CH
1.0000 | ORAL_TABLET | Freq: Every day | ORAL | Status: DC
  Administered 2014-11-13 – 2014-11-14 (×2): 1 via ORAL
  Filled 2014-11-12 (×2): qty 1

## 2014-11-12 MED ORDER — HEPARIN (PORCINE) IN NACL 100-0.45 UNIT/ML-% IJ SOLN
1000.0000 [IU]/h | INTRAMUSCULAR | Status: DC
  Administered 2014-11-12: 1000 [IU]/h via INTRAVENOUS
  Filled 2014-11-12: qty 250

## 2014-11-12 MED ORDER — ATORVASTATIN CALCIUM 80 MG PO TABS
80.0000 mg | ORAL_TABLET | Freq: Every day | ORAL | Status: DC
  Administered 2014-11-12 – 2014-11-13 (×2): 80 mg via ORAL
  Filled 2014-11-12 (×3): qty 1

## 2014-11-12 MED ORDER — VERAPAMIL HCL 2.5 MG/ML IV SOLN
INTRAVENOUS | Status: AC
Start: 2014-11-12 — End: 2014-11-12
  Filled 2014-11-12: qty 2

## 2014-11-12 MED ORDER — METOPROLOL TARTRATE 12.5 MG HALF TABLET
12.5000 mg | ORAL_TABLET | Freq: Two times a day (BID) | ORAL | Status: DC
  Administered 2014-11-12 – 2014-11-14 (×5): 12.5 mg via ORAL
  Filled 2014-11-12 (×6): qty 1

## 2014-11-12 MED ORDER — BENAZEPRIL-HYDROCHLOROTHIAZIDE 20-25 MG PO TABS: 1.0000 | ORAL_TABLET | Freq: Every day | ORAL | Status: DC

## 2014-11-12 MED ORDER — ONDANSETRON HCL 4 MG/2ML IJ SOLN: 4.0000 mg | Freq: Four times a day (QID) | INTRAMUSCULAR | Status: DC | PRN

## 2014-11-12 MED ORDER — SODIUM CHLORIDE 0.9 % IV SOLN: 1.0000 mL/kg/h | INTRAVENOUS | Status: AC

## 2014-11-12 MED ORDER — HEPARIN BOLUS VIA INFUSION
4000.0000 [IU] | Freq: Once | INTRAVENOUS | Status: AC
  Administered 2014-11-12: 4000 [IU] via INTRAVENOUS
  Filled 2014-11-12: qty 4000

## 2014-11-12 MED ORDER — MULTI-VITAMIN/MINERALS PO TABS: 2.0000 | ORAL_TABLET | Freq: Every day | ORAL | Status: DC

## 2014-11-12 MED ORDER — FENTANYL CITRATE 0.05 MG/ML IJ SOLN
INTRAMUSCULAR | Status: AC
  Filled 2014-11-12: qty 2

## 2014-11-12 MED ORDER — HEPARIN SODIUM (PORCINE) 1000 UNIT/ML IJ SOLN
INTRAMUSCULAR | Status: AC
  Filled 2014-11-12: qty 1

## 2014-11-12 MED ORDER — MORPHINE SULFATE 4 MG/ML IJ SOLN
4.0000 mg | Freq: Once | INTRAMUSCULAR | Status: AC
  Administered 2014-11-12: 4 mg via INTRAVENOUS
  Filled 2014-11-12: qty 1

## 2014-11-12 MED ORDER — HYDROCHLOROTHIAZIDE 25 MG PO TABS
25.0000 mg | ORAL_TABLET | Freq: Every day | ORAL | Status: DC
  Administered 2014-11-13 – 2014-11-14 (×2): 25 mg via ORAL
  Filled 2014-11-12 (×2): qty 1

## 2014-11-12 MED ORDER — ACETAMINOPHEN 325 MG PO TABS: 650.0000 mg | ORAL_TABLET | ORAL | Status: DC | PRN

## 2014-11-12 MED ORDER — ASPIRIN 81 MG PO CHEW
81.0000 mg | CHEWABLE_TABLET | Freq: Every day | ORAL | Status: DC
  Administered 2014-11-13 – 2014-11-14 (×2): 81 mg via ORAL
  Filled 2014-11-12: qty 1

## 2014-11-12 MED ORDER — TICAGRELOR 90 MG PO TABS
ORAL_TABLET | ORAL | Status: AC
  Filled 2014-11-12: qty 2

## 2014-11-12 MED ORDER — NITROGLYCERIN 0.4 MG SL SUBL: 0.4000 mg | SUBLINGUAL_TABLET | SUBLINGUAL | Status: DC | PRN

## 2014-11-12 MED ORDER — ASPIRIN 81 MG PO CHEW
324.0000 mg | CHEWABLE_TABLET | Freq: Once | ORAL | Status: AC
  Administered 2014-11-12: 324 mg via ORAL
  Filled 2014-11-12: qty 4

## 2014-11-12 MED ORDER — LIDOCAINE HCL (PF) 1 % IJ SOLN
INTRAMUSCULAR | Status: AC
  Filled 2014-11-12: qty 30

## 2014-11-12 MED ORDER — MIDAZOLAM HCL 2 MG/2ML IJ SOLN
INTRAMUSCULAR | Status: AC
  Filled 2014-11-12: qty 2

## 2014-11-12 MED ORDER — BIVALIRUDIN 250 MG IV SOLR
INTRAVENOUS | Status: AC
  Filled 2014-11-12: qty 250

## 2014-11-12 MED ORDER — BENAZEPRIL HCL 20 MG PO TABS
20.0000 mg | ORAL_TABLET | Freq: Every day | ORAL | Status: DC
  Administered 2014-11-13 – 2014-11-14 (×2): 20 mg via ORAL
  Filled 2014-11-12 (×2): qty 1

## 2014-11-12 MED ORDER — OXYCODONE-ACETAMINOPHEN 5-325 MG PO TABS: 1.0000 | ORAL_TABLET | ORAL | Status: DC | PRN

## 2014-11-12 MED ORDER — MORPHINE SULFATE 2 MG/ML IJ SOLN: 2.0000 mg | INTRAMUSCULAR | Status: DC | PRN

## 2014-11-12 MED ORDER — SODIUM CHLORIDE 0.9 % IJ SOLN: 3.0000 mL | Freq: Two times a day (BID) | INTRAMUSCULAR | Status: DC

## 2014-11-12 MED ORDER — HEPARIN (PORCINE) IN NACL 2-0.9 UNIT/ML-% IJ SOLN
INTRAMUSCULAR | Status: AC
  Filled 2014-11-12: qty 1000

## 2014-11-12 MED ORDER — SODIUM CHLORIDE 0.9 % IV SOLN: 250.0000 mL | INTRAVENOUS | Status: DC | PRN

## 2014-11-12 MED ORDER — TICAGRELOR 90 MG PO TABS
90.0000 mg | ORAL_TABLET | Freq: Two times a day (BID) | ORAL | Status: DC
  Administered 2014-11-12 – 2014-11-14 (×4): 90 mg via ORAL
  Filled 2014-11-12 (×5): qty 1

## 2014-11-12 NOTE — Progress Notes (Signed)
ANTICOAGULATION CONSULT NOTE - Initial Consult  Pharmacy Consult for Heparin  Indication: chest pain/ACS  No Known Allergies  Patient Measurements: Height: 5\' 11"  (180.3 cm) Weight: 200 lb (90.719 kg) IBW/kg (Calculated) : 75.3  Vital Signs: Temp: 98.5 F (36.9 C) (03/02 0704) Temp Source: Oral (03/02 0704) BP: 162/77 mmHg (03/02 0704) Pulse Rate: 74 (03/02 0704)  Labs:  Recent Labs  11/12/14 0712  HGB 15.6  HCT 45.2  PLT 224    CrCl cannot be calculated (Patient has no serum creatinine result on file.).   Medical History: Past Medical History  Diagnosis Date  . Hypertension     put on bp meds. when he was heavier and was not exercising  . Recurrent upper respiratory infection (URI)     current cold being treated with otc meds.  . Arthritis     arthritis in right hip    Assessment: Starting heparin for CP. Troponin is elevated at 9.83. CBC good. Only anti-coagulant use was Xarelto for DVT prophylaxis around orthopedic surgery around 4 hours ago.   Goal of Therapy:  Heparin level 0.3-0.7 units/ml Monitor platelets by anticoagulation protocol: Yes   Plan:  -Heparin 4000 units BOLUS -Start heparin drip at 1000 units/hr -1600 HL -Daily CBC/HL -Monitor for bleeding  Narda Bonds 11/12/2014,7:50 AM

## 2014-11-12 NOTE — Progress Notes (Signed)
Utilization Review Completed.Donne Anon T3/10/2014

## 2014-11-12 NOTE — CV Procedure (Signed)
Cardiac Catheterization Procedure Note  Name: Leonard Rodriguez MRN: [id number] DOB: 1946-01-13  Procedure: Left Heart Cath, Selective Coronary Angiography, LV angiography, PTCA and stenting of the distal left circumflex  Indication: Non-ST elevation MI  Procedural Details:  The right wrist was prepped, draped, and anesthetized with 1% lidocaine. Using the modified Seldinger technique, a 5/6 French Slender sheath was introduced into the right radial artery. 3 mg of verapamil was administered through the sheath, weight-based unfractionated heparin was administered intravenously. Standard Judkins catheters were used for selective coronary angiography and left ventriculography. Catheter exchanges were performed over an exchange length guidewire.  PROCEDURAL FINDINGS Hemodynamics: AO 142/64 LV 139/14   Coronary angiography: Coronary dominance: right  Left mainstem: The left main is patent with no obstructive disease. The vessel divides into the LAD and left circumflex.  Left anterior descending (LAD): The LAD has diffuse irregularity without significant stenosis. Proximal LAD is widely patent. The mid LAD after the first diagonal has diffuse irregularities. The first diagonal has 30-40% stenosis. The mid and distal LAD are widely patent without stenosis.  Left circumflex (LCx): The left circumflex is patent proximally. The first obtuse marginal branches 1 and 2 are patent. There are mild ostial stenoses of about 30-40%. The distal AV circumflex beyond the second obtuse marginal branch is totally occluded with faint filling of the third OM branch.  Right coronary artery (RCA): The RCA has a high, anterior origin. The proximal vessel has 50% stenosis. The mid vessel is patent. The acute marginal branch is patent. There is a small PDA branch with no significant stenosis. There is a faint collateral to the left circumflex present.  Left ventriculography: The basal inferior wall is severely  hypokinetic. The other LV wall segments contract normally. The LVEF is estimated at 55%. There is mild mitral regurgitation present.  PCI Note:  Following the diagnostic procedure, the decision was made to proceed with PCI of the left circumflex. The vessel is totally occluded with angiographic findings consistent with an acute occlusion. The patient was loaded with brilinta 180 mg on the table. Weight-based bivalirudin was given for anticoagulation. Once a therapeutic ACT was achieved, a 6 [place] XB 3.5 cm guide catheter was inserted.  A cougar guidewire was initially attempted but could not be advanced across the lesion. A whisper coronary guidewire was used to cross the lesion.  The lesion was predilated with a 2.5 mm balloon.  The lesion was then stented with a 3.5 x 18 mm resolute DES.  After stenting there was mild no-reflow. Intracoronary verapamil was administered. The stent appear well expanded. The flow returned to normal. I felt that postdilatation would likely exacerbate no-reflow. There was a distal occlusion of the AV groove circumflex and a separate branch. I redirected the wire across that occlusion. The balloon was advanced and dilated. However, flow was not restored. The vessel appeared very small and I did not think that further efforts were worthwhile.  Following PCI, there was 0% residual stenosis and TIMI-3 flow. Final angiography confirmed an excellent result. The patient tolerated the procedure well. There were no immediate procedural complications. A TR band was used for radial hemostasis. The patient was transferred to the post catheterization recovery area for further monitoring.  PCI Data: Vessel - LCx/Segment - distal Percent Stenosis (pre)  100 TIMI-flow 0 Stent 3.5x18 mm DES Percent Stenosis (post) 0 TIMI-flow (post) 3  Estimated Blood Loss: minimal  Final Conclusions:   1. Single-vessel coronary artery disease with total occlusion of  the distal left circumflex, treated  successfully with PCI using a drug-eluting stent 2. Diffuse nonobstructive stenosis of the RCA and LAD 3. Mild contraction abnormality left ventricle with hypokinesis of the basal inferior wall and preserved overall LVEF   Recommendations:  Post MI medical therapy. Suspect the patient can be discharged tomorrow as long as no complications occurred.  Sherren Mocha MD, [facility] 11/12/2014, 10:55 AM

## 2014-11-12 NOTE — ED Notes (Signed)
Cardiology at bedside.

## 2014-11-12 NOTE — ED Notes (Signed)
Permit signed for cardiac cath/PCI. Dr. Ron Parker at bedside.

## 2014-11-12 NOTE — H&P (Signed)
CARDIOLOGY HISTORY AND PHYSICAL   Patient ID: Leonard Rodriguez MRN: [id number]  DOB/AGE: [birth year] 69 y.o. Admit date: 11/12/2014  Primary Care Physician:  Jonna Coup Primary Cardiologist:  Reola Calkins  (ask Burt Knack if he will follow)  Clinical Summary Leonard Rodriguez is a 69 y.o.male.  He is a very active gentleman who exercises regularly with no symptoms. His only risk factor is hypertension that is treated. I do not have his cholesterol values.  Several days ago after exercising he had chest tightness. This has waxed and waned over several days. Last night it returned and persisted all night long. He came to the emergency room. He did receive some morphine in the emergency room. Troponin is 9. EKG is abnormal but of poor quality. There is suggestion of inferior ST elevation. His presentation is consistent with STEMI. It has been stuttering. We are making arrangements immediately to proceed to the cath lab.   No Known Allergies  Home Medications  (Not in a hospital admission)  Scheduled Medications     Infusions . heparin 1,000 Units/hr (11/12/14 0848)     PRN Medications    Past Medical History  Diagnosis Date  . Hypertension     put on bp meds. when he was heavier and was not exercising  . Recurrent upper respiratory infection (URI)     current cold being treated with otc meds.  . Arthritis     arthritis in right hip    Past Surgical History  Procedure Laterality Date  . Vasectomy    . Total hip arthroplasty  08/30/2011    Procedure: TOTAL HIP ARTHROPLASTY;  Surgeon: Garald Balding, MD;  Location: [place];  Service: Orthopedics;  Laterality: Right;    There is no family history of significant coronary disease.  Social History Leonard Rodriguez reports that he has never smoked. He has never used smokeless tobacco. Leonard Rodriguez reports that he does not drink alcohol.  Review of Systems Patient denies fever, chills, headache, sweats, rash, change in vision, change in  hearing, cough, nausea or vomiting, urinary symptoms. All other systems are reviewed and are negative.  Physical Examination Temp:  [98.5 F (36.9 C)] 98.5 F (36.9 C) (03/02 0704) Pulse Rate:  [67-74] 68 (03/02 0800) Resp:  [18-24] 20 (03/02 0800) BP: (151-162)/(73-77) 151/73 mmHg (03/02 0800) SpO2:  [94 %-96 %] 94 % (03/02 0800) Weight:  [200 lb (90.719 kg)] 200 lb (90.719 kg) (03/02 0704) No intake or output data in the 24 hours ending 11/12/14 0911  Patient is oriented to person time and place. Affect is normal. His wife is in the room. Head is atraumatic. Sclera and conjunctiva are normal. There is no jugular venous distention. Lungs are clear. Respiratory effort is nonlabored. Cardiac exam reveals S1 and S2. Abdomen is soft. There is no peripheral edema. There are no musculoskeletal deformities. There are no skin rashes. Neurologic is grossly intact.  Lab Results  Basic Metabolic Panel:  Recent Labs Lab 11/12/14 0712  NA 136  K 4.2  CL 98  CO2 26  GLUCOSE 134*  BUN 9  CREATININE 1.07  CALCIUM 9.4    Liver Function Tests:  Recent Labs Lab 11/12/14 0712  AST 182*  ALT 39  ALKPHOS 56  BILITOT 1.5*  PROT 8.0  ALBUMIN 4.1    CBC:  Recent Labs Lab 11/12/14 0712  WBC 13.8*  HGB 15.6  HCT 45.2  MCV 91.7  PLT 224    Cardiac Enzymes:  BNP: Invalid input(s): POCBNP   Radiology Dg Chest 2 View  11/12/2014   CLINICAL DATA:  Chest pain on and off for several days. Initial encounter.  EXAM: CHEST  2 VIEW  COMPARISON:  08/17/2011.  FINDINGS: The heart size and mediastinal contours are within normal limits. Both lungs are clear except for minor LEFT basilar opacity laterally along the hemidiaphragm, new from priors. Small infiltrate not excluded. No osseous findings.  IMPRESSION: Cannot exclude early LEFT base infiltrate. Findings represent an interval change from 2012.   Electronically Signed   By: Rolla Flatten M.D.   On: 11/12/2014 07:59    Prior Cardiac  Testing/Procedures:   ECG EKG suggests slight inferior ST elevation. The EKG is of poor quality. Repeat tracing is needed.   Impression and Recommendations    STEMI (ST elevation myocardial infarction)     The patient's symptoms are consistent with a stuttering acute ST elevation MI. Symptoms have waxed and waned for several days. He had prolonged symptoms during the nighttime. The only EKG available is of poor quality. However it suggests inferior ST elevation. He is being treated as a STEMI. He has received a bolus of IV heparin in the emergency room. I explained the plan to proceed to the cath lab. I discussed risks and benefits. The patient and his wife are in full agreement. I have reviewed the case with Dr. Burt Knack who will proceed in the cath lab.    History of hypertension   Daryel November, MD 11/12/2014, 9:11 AM

## 2014-11-12 NOTE — ED Provider Notes (Signed)
CSN: [id number]     Arrival date & time 11/12/14  0654 History   First MD Initiated Contact with Patient 11/12/14 [phone]     Chief Complaint  Patient presents with  . Chest Pain   HPI Pt noticed pain in his chest sat afternoon.  An ache type discomfort.  This was after working out at the gym during the day although he did not have pain or discomfort during the work out..  Monday the pain came back and lasted the day.  Tuesday morning  he was fine but then yesterday afternoon it returned and has persisted. 18 hours straight.  Constant mild to moderate aching.  Center of the chest. No nausea or shortness of breath. No abdominal pain.  Burping helps a little.  Deep breathing also helps.  No history of heart disease.  No history of PE or DVT.  He exercises regularly , biking 1-2 hours three times per week and with weights at least once per week.  Past Medical History  Diagnosis Date  . Hypertension     put on bp meds. when he was heavier and was not exercising  . Recurrent upper respiratory infection (URI)     current cold being treated with otc meds.  . Arthritis     arthritis in right hip   Past Surgical History  Procedure Laterality Date  . Vasectomy    . Total hip arthroplasty  08/30/2011    Procedure: TOTAL HIP ARTHROPLASTY;  Surgeon: Garald Balding, MD;  Location: [place];  Service: Orthopedics;  Laterality: Right;   No family history on file. History  Substance Use Topics  . Smoking status: Never Smoker   . Smokeless tobacco: Never Used  . Alcohol Use: No    Review of Systems  All other systems reviewed and are negative.     Allergies  Review of patient's allergies indicates no known allergies.  Home Medications   Prior to Admission medications   Medication Sig Start Date End Date Taking? Authorizing Provider  Iron-Vitamins (GERITOL COMPLETE) TABS Take 1 tablet by mouth daily.      Historical Provider, MD  rivaroxaban (XARELTO) 10 MG TABS tablet Take 1 tablet (10 mg  total) by mouth daily with supper. 09/01/11   Biagio Borg, PA-C   BP 162/77 mmHg  Pulse 74  Temp(Src) 98.5 F (36.9 C) (Oral)  Resp 18  Ht 5\' 11"  (1.803 m)  Wt 200 lb (90.719 kg)  BMI 27.91 kg/m2  SpO2 96% Physical Exam  Constitutional: He appears well-developed and well-nourished. No distress.  HENT:  Head: Normocephalic and atraumatic.  Right Ear: External ear normal.  Left Ear: External ear normal.  Eyes: Conjunctivae are normal. Right eye exhibits no discharge. Left eye exhibits no discharge. No scleral icterus.  Neck: Neck supple. No tracheal deviation present.  Cardiovascular: Normal rate, regular rhythm and intact distal pulses.   Pulmonary/Chest: Effort normal and breath sounds normal. No stridor. No respiratory distress. He has no wheezes. He has no rales.  Abdominal: Soft. Bowel sounds are normal. He exhibits no distension. There is no tenderness. There is no rebound and no guarding.  Musculoskeletal: He exhibits no edema or tenderness.  Neurological: He is alert. He has normal strength. No cranial nerve deficit (no facial droop, extraocular movements intact, no slurred speech) or sensory deficit. He exhibits normal muscle tone. He displays no seizure activity. Coordination normal.  Skin: Skin is warm and dry. No rash noted.  Psychiatric: He has a normal mood  and affect.  Nursing note and vitals reviewed.   ED Course  Procedures (including critical care time) Labs Review Labs Reviewed  CBC - Abnormal; Notable for the following:    WBC 13.8 (*)    All other components within normal limits  I-STAT TROPOININ, ED - Abnormal; Notable for the following:    Troponin i, poc 9.83 (*)    All other components within normal limits  BASIC METABOLIC PANEL  LIPASE, BLOOD  HEPATIC FUNCTION PANEL  APTT    Imaging Review Dg Chest 2 View  11/12/2014   CLINICAL DATA:  Chest pain on and off for several days. Initial encounter.  EXAM: CHEST  2 VIEW  COMPARISON:  08/17/2011.   FINDINGS: The heart size and mediastinal contours are within normal limits. Both lungs are clear except for minor LEFT basilar opacity laterally along the hemidiaphragm, new from priors. Small infiltrate not excluded. No osseous findings.  IMPRESSION: Cannot exclude early LEFT base infiltrate. Findings represent an interval change from 2012.   Electronically Signed   By: Rolla Flatten M.D.   On: 11/12/2014 07:59     EKG Interpretation   Date/Time:  Wednesday November 12 2014 07:01:04 EST Ventricular Rate:  65 PR Interval:  158 QRS Duration: 93 QT Interval:  392 QTC Calculation: 408 R Axis:   38 Text Interpretation:  Sinus rhythm Probable inferior infarct, recent  ,changes are new since ECG 17 Aug 2011 Lateral leads are also involved  Confirmed by Nel Stoneking  MD-J, Mykle Pascua ([id number]) on 11/12/2014 7:27:38 AM     Medications  heparin bolus via infusion 4,000 Units (not administered)  heparin ADULT infusion 100 units/mL (25000 units/250 mL) (not administered)  aspirin chewable tablet 324 mg (324 mg Oral Given 11/12/14 0800)  morphine 4 MG/ML injection 4 mg (4 mg Intravenous Given 11/12/14 0800)    MDM   Final diagnoses:  NSTEMI (non-ST elevated myocardial infarction)    Pt's troponin is elevated.  Consistent with NSTEMI.  Will start aspirin and heparin per pharmacy.  Consult cardiology.  Pt remains comfortable although still with mild pain..  Vitals stable.  CXR reviewed.  Doubt PNA.    Dorie Rank, MD 11/12/14 [phone]

## 2014-11-12 NOTE — ED Notes (Signed)
MD Knapp at bedside 

## 2014-11-12 NOTE — ED Notes (Signed)
Pt in reporting intermittent episodes of central chest pain over the last week, pain appears at rest, seems worse after eating, improved with belching, denies other symptoms, taking a deep breath improves pain, no distress noted

## 2014-11-12 NOTE — ED Notes (Signed)
Patient states since Saturday has had central chest pain off and on.  This morning it seemed worse.

## 2014-11-12 NOTE — Interval H&P Note (Signed)
History and Physical Interval Note:  11/12/2014 9:23 AM  Leonard Rodriguez  has presented today for surgery, with the diagnosis of urgent  The various methods of treatment have been discussed with the patient and family. After consideration of risks, benefits and other options for treatment, the patient has consented to  Procedure(s): LEFT HEART CATHETERIZATION WITH CORONARY ANGIOGRAM (N/A) as a surgical intervention .  The patient's history has been reviewed, patient examined, no change in status, stable for surgery.  I have reviewed the patient's chart and labs.  Questions were answered to the patient's satisfaction.    Cath Lab Visit (complete for each Cath Lab visit)  Clinical Evaluation Leading to the Procedure:   ACS: Yes.    Non-ACS:    Anginal Classification: CCS IV  Anti-ischemic medical therapy: No Therapy  Non-Invasive Test Results: No non-invasive testing performed  Prior CABG: No previous CABG       Sherren Mocha

## 2014-11-12 NOTE — Progress Notes (Signed)
TR BAND REMOVAL  LOCATION:    right radial  DEFLATED PER PROTOCOL:    Yes.    TIME BAND OFF / DRESSING APPLIED:    1500   SITE UPON ARRIVAL:    Level 0  SITE AFTER BAND REMOVAL:    Level 0  CIRCULATION SENSATION AND MOVEMENT:    Within Normal Limits   Yes.    COMMENTS:   Rechecked site at 1530 and 1600 without change. Dressing dry and intact and CSMs wnls., pulses remain +3 ulnar and radial.

## 2014-11-13 ENCOUNTER — Encounter (HOSPITAL_COMMUNITY): Payer: Self-pay | Admitting: Cardiovascular Disease

## 2014-11-13 DIAGNOSIS — I214 Non-ST elevation (NSTEMI) myocardial infarction: Principal | ICD-10-CM

## 2014-11-13 DIAGNOSIS — I10 Essential (primary) hypertension: Secondary | ICD-10-CM

## 2014-11-13 DIAGNOSIS — E785 Hyperlipidemia, unspecified: Secondary | ICD-10-CM

## 2014-11-13 DIAGNOSIS — I25110 Atherosclerotic heart disease of native coronary artery with unstable angina pectoris: Secondary | ICD-10-CM

## 2014-11-13 DIAGNOSIS — I2510 Atherosclerotic heart disease of native coronary artery without angina pectoris: Secondary | ICD-10-CM

## 2014-11-13 LAB — CBC
HCT: 38 % — ABNORMAL LOW (ref 39.0–52.0)
Hemoglobin: 13.3 g/dL (ref 13.0–17.0)
MCH: 31.6 pg (ref 26.0–34.0)
MCHC: 35 g/dL (ref 30.0–36.0)
MCV: 90.3 fL (ref 78.0–100.0)
Platelets: 207 10*3/uL (ref 150–400)
RBC: 4.21 MIL/uL — AB (ref 4.22–5.81)
RDW: 13.3 % (ref 11.5–15.5)
WBC: 15.5 10*3/uL — ABNORMAL HIGH (ref 4.0–10.5)

## 2014-11-13 LAB — LIPID PANEL
CHOL/HDL RATIO: 5.3 ratio
Cholesterol: 229 mg/dL — ABNORMAL HIGH (ref 0–200)
HDL: 43 mg/dL (ref >39)
LDL Cholesterol: 159 mg/dL — ABNORMAL HIGH (ref 0–99)
TRIGLYCERIDES: 134 mg/dL (ref <150)
VLDL: 27 mg/dL (ref 0–40)

## 2014-11-13 LAB — BASIC METABOLIC PANEL
ANION GAP: 10 (ref 5–15)
BUN: 13 mg/dL (ref 6–23)
CO2: 29 mmol/L (ref 19–32)
CREATININE: 1.24 mg/dL (ref 0.50–1.35)
Calcium: 8.7 mg/dL (ref 8.4–10.5)
Chloride: 95 mmol/L — ABNORMAL LOW (ref 96–112)
GFR calc Af Amer: 67 mL/min — ABNORMAL LOW (ref >90)
GFR calc non Af Amer: 58 mL/min — ABNORMAL LOW (ref >90)
Glucose, Bld: 130 mg/dL — ABNORMAL HIGH (ref 70–99)
Potassium: 4.1 mmol/L (ref 3.5–5.1)
Sodium: 134 mmol/L — ABNORMAL LOW (ref 135–145)

## 2014-11-13 LAB — HEMOGLOBIN A1C
HEMOGLOBIN A1C: 5.7 % — AB (ref 4.8–5.6)
MEAN PLASMA GLUCOSE: 117 mg/dL

## 2014-11-13 LAB — TROPONIN I: Troponin I: 42.8 ng/mL (ref <0.031)

## 2014-11-13 MED FILL — Sodium Chloride IV Soln 0.9%: INTRAVENOUS | Qty: 50 | Status: AC

## 2014-11-13 NOTE — Progress Notes (Signed)
Patient Name: Leonard Rodriguez Date of Encounter: 11/13/2014  Principal Problem:   NSTEMI (non-ST elevated myocardial infarction) Active Problems:   CAD (coronary artery disease)   HTN (hypertension)   Hyperlipidemia    SUBJECTIVE  S/p PCI/DES to the distal LCX yesterday.  Troponins peaked > 80.  No chest pain or sob overnight.  CURRENT MEDS . aspirin  81 mg Oral Daily  . atorvastatin  80 mg Oral q1800  . benazepril  20 mg Oral Daily   And  . hydrochlorothiazide  25 mg Oral Daily  . heparin  5,000 Units Subcutaneous 3 times per day  . metoprolol tartrate  12.5 mg Oral BID  . multivitamin with minerals  1 tablet Oral Daily  . sodium chloride  3 mL Intravenous Q12H  . ticagrelor  90 mg Oral BID    OBJECTIVE  Filed Vitals:   11/12/14 1715 11/12/14 2003 11/13/14 0023 11/13/14 0631  BP: 138/57 102/64 110/53 119/64  Pulse: 78 69 66 70  Temp:  99.5 F (37.5 C) 99.2 F (37.3 C) 98.1 F (36.7 C)  TempSrc:  Oral Oral Oral  Resp: 17 16 23 17   Height:      Weight:   207 lb 0.2 oz (93.9 kg)   SpO2: 96% 95% 97% 91%    Intake/Output Summary (Last 24 hours) at 11/13/14 0714 Last data filed at 11/12/14 2117  Gross per 24 hour  Intake    721 ml  Output   1525 ml  Net   -804 ml   Filed Weights   11/12/14 0704 11/12/14 1110 11/13/14 0023  Weight: 200 lb (90.719 kg) 205 lb 4 oz (93.1 kg) 207 lb 0.2 oz (93.9 kg)   PHYSICAL EXAM  General: Pleasant, NAD. Neuro: Alert and oriented X 3. Moves all extremities spontaneously. Psych: Normal affect. HEENT:  Normal  Neck: Supple without bruits or JVD. Lungs:  Resp regular and unlabored, CTA. Heart: RRR no s3, s4, or murmurs. Abdomen: Soft, non-tender, non-distended, BS + x 4.  Extremities: No clubbing, cyanosis or edema. DP/PT/Radials 2+ and equal bilaterally.  Accessory Clinical Findings  CBC  Recent Labs  11/12/14 1433 11/13/14 0108  WBC 13.7* 15.5*  HGB 14.4 13.3  HCT 41.6 38.0*  MCV 92.2 90.3  PLT 197 007   Basic  Metabolic Panel  Recent Labs  11/12/14 0712 11/12/14 1433 11/13/14 0108  NA 136  --  134*  K 4.2  --  4.1  CL 98  --  95*  CO2 26  --  29  GLUCOSE 134*  --  130*  BUN 9  --  13  CREATININE 1.07 1.17 1.24  CALCIUM 9.4  --  8.7   Liver Function Tests  Recent Labs  11/12/14 0712  AST 182*  ALT 39  ALKPHOS 56  BILITOT 1.5*  PROT 8.0  ALBUMIN 4.1    Recent Labs  11/12/14 0712  LIPASE 26   Cardiac Enzymes  Recent Labs  11/12/14 1433 11/12/14 2100 11/13/14 0108  TROPONINI >80.00* 54.85* 42.80*   Hemoglobin A1C  Recent Labs  11/12/14 1433  HGBA1C 5.7*   Fasting Lipid Panel  Recent Labs  11/13/14 0108  CHOL 229*  HDL 43  LDLCALC 159*  TRIG 134  CHOLHDL 5.3   TELE  Rsr, brief run of PAT.  ECG  Rsr, 67, no acute st/t changes.  Radiology/Studies  Dg Chest 2 View  11/12/2014   CLINICAL DATA:  Chest pain on and off for several days.  Initial encounter.  EXAM: CHEST  2 VIEW  COMPARISON:  08/17/2011.  FINDINGS: The heart size and mediastinal contours are within normal limits. Both lungs are clear except for minor LEFT basilar opacity laterally along the hemidiaphragm, new from priors. Small infiltrate not excluded. No osseous findings.  IMPRESSION: Cannot exclude early LEFT base infiltrate. Findings represent an interval change from 2012.   Electronically Signed   By: Rolla Flatten M.D.   On: 11/12/2014 07:59    ASSESSMENT AND PLAN  1.  NSTEMI/CAD:  S/p PCI/DES to the distal LCX yesterday.  OTW nonobstructive dzs.  No c/p overnight.  Trop peaked > 80.  42 earlier this AM.  Ambulate today.  Cont asa, statin, bb, acei, ticagrelor.  Prob d/c tomorrow.  2.  HTN:  Stable. Cont bb/acei/diuretic.  3.  HL:  LDL 159.  Statin naive.  Cont high potency statin.  Signed, Murray Hodgkins NP   I have seen, examined and evaluated the patient this AM along with Mr. Sharolyn Douglas, NP.  After reviewing all the available data and chart,  I agree with his findings,  examination as well as impression recommendations.  NSTEMI with significant Troponin elevation s/p PCI of Cx.  Stable with no residual angina. Wrist site intact.  Agree with plan to monitor x 1 more day given extent of troponin leak.  On BB, ACE-I, Statin & DAPT.  Anticipate d/c in AM if stable   [name], Leonie Green, M.D., M.S. Interventional Cardiologist   Pager # [phone]

## 2014-11-13 NOTE — Care Management Note (Signed)
    Page 1 of 1   11/13/2014     10:13:50 AM CARE MANAGEMENT NOTE 11/13/2014  Patient:  Leonard Rodriguez, Leonard Rodriguez   Account Number:  [id number]  Date Initiated:  11/13/2014  Documentation initiated by:  GRAVES-BIGELOW,Zarria Towell  Subjective/Objective Assessment:   Pt in with cp-post cath. Plan for d/c today.     Action/Plan:   Pt given brilinta 30 day free card. Will need Rx 30 day. Pt uses [facility] and medication is not available. Pt will go to [facility] for Brilinta only. Other meds to be sent to [facility].   Anticipated DC Date:  11/13/2014   Anticipated DC Plan:  [place]  CM consult      Choice offered to / List presented to:             Status of service:  Completed, signed off Medicare Important Message given?  NO (If response is "NO", the following Medicare IM given date fields will be blank) Date Medicare IM given:   Medicare IM given by:   Date Additional Medicare IM given:   Additional Medicare IM given by:    Discharge Disposition:  HOME/SELF CARE  Per UR Regulation:  Reviewed for med. necessity/level of care/duration of stay  If discussed at [place] of Stay Meetings, dates discussed:    Comments:  11-13-14 1007 Jacqlyn Krauss, RN,BSN [phone] Benefits check in process, will make pt aware once completed. No further needs from CM at this time.

## 2014-11-13 NOTE — Progress Notes (Signed)
CARDIAC REHAB PHASE I   PRE:  Rate/Rhythm: 78 SR  BP:  Supine:   Sitting: 129/67  Standing:    SaO2:   MODE:  Ambulation: 800 ft   POST:  Rate/Rhythm: 80SR  BP:  Supine:   Sitting: 144/59  Standing:    SaO2:  0910-1005 Pt walked 800 ft with steady gait. No CP. MI education completed with pt who voiced understanding. Gave heart healthy diet for pt to read and went over briefly. Stressed importance of brilinta with stent and gave brilinta booklet. Discussed CRP 2 and pt gave permission to refer to [facility]. Pt is very active, riding stationary bike 2 hours at a time and goes to gym six days a week. Discussed getting back to ex slowly due to MI and high troponin.  Reviewed NTG use and MI restrictions.   Graylon Good, RN BSN  11/13/2014 10:01 AM

## 2014-11-14 ENCOUNTER — Encounter (HOSPITAL_COMMUNITY): Payer: Self-pay | Admitting: Nurse Practitioner

## 2014-11-14 ENCOUNTER — Telehealth: Payer: Self-pay | Admitting: Physician Assistant

## 2014-11-14 MED ORDER — NITROGLYCERIN 0.4 MG SL SUBL
0.4000 mg | SUBLINGUAL_TABLET | SUBLINGUAL | Status: DC | PRN
Start: 2014-11-14 — End: 2014-11-14

## 2014-11-14 MED ORDER — TICAGRELOR 90 MG PO TABS: 90.0000 mg | ORAL_TABLET | Freq: Two times a day (BID) | ORAL | Status: DC

## 2014-11-14 MED ORDER — ATORVASTATIN CALCIUM 80 MG PO TABS: 80.0000 mg | ORAL_TABLET | Freq: Every day | ORAL | Status: DC

## 2014-11-14 MED ORDER — METOPROLOL TARTRATE 25 MG PO TABS: 12.5000 mg | ORAL_TABLET | Freq: Two times a day (BID) | ORAL | Status: DC

## 2014-11-14 MED ORDER — ASPIRIN 81 MG PO CHEW: 81.0000 mg | CHEWABLE_TABLET | Freq: Every day | ORAL | Status: DC

## 2014-11-14 MED ORDER — NITROGLYCERIN 0.4 MG SL SUBL: 0.4000 mg | SUBLINGUAL_TABLET | SUBLINGUAL | Status: DC | PRN

## 2014-11-14 NOTE — Progress Notes (Signed)
Patient Name: Leonard Rodriguez Date of Encounter: 11/14/2014     Principal Problem:   NSTEMI (non-ST elevated myocardial infarction) Active Problems:   CAD (coronary artery disease)   HTN (hypertension)   Hyperlipidemia    SUBJECTIVE  No c/p or sob.  Eager to go home.  Ambulated yesterday w/o difficulty.  CURRENT MEDS . aspirin  81 mg Oral Daily  . atorvastatin  80 mg Oral q1800  . benazepril  20 mg Oral Daily   And  . hydrochlorothiazide  25 mg Oral Daily  . heparin  5,000 Units Subcutaneous 3 times per day  . metoprolol tartrate  12.5 mg Oral BID  . multivitamin with minerals  1 tablet Oral Daily  . ticagrelor  90 mg Oral BID    OBJECTIVE  Filed Vitals:   11/13/14 1718 11/13/14 1739 11/13/14 2012 11/14/14 0547  BP: 120/55 108/64 119/61 102/61  Pulse: 70 80 76 70  Temp: 98.7 F (37.1 C) 98.4 F (36.9 C) 98.8 F (37.1 C) 99.1 F (37.3 C)  TempSrc: Oral Oral Oral Oral  Resp: 18 18 18 18   Height:      Weight:      SpO2: 96% 94% 96% 96%    Intake/Output Summary (Last 24 hours) at 11/14/14 0652 Last data filed at 11/13/14 1700  Gross per 24 hour  Intake    840 ml  Output      0 ml  Net    840 ml   Filed Weights   11/12/14 0704 11/12/14 1110 11/13/14 0023  Weight: 200 lb (90.719 kg) 205 lb 4 oz (93.1 kg) 207 lb 0.2 oz (93.9 kg)    PHYSICAL EXAM  General: Pleasant, NAD. Neuro: Alert and oriented X 3. Moves all extremities spontaneously. Psych: Normal affect. HEENT:  Normal  Neck: Supple without bruits or JVD. Lungs:  Resp regular and unlabored, CTA. Heart: RRR no s3, s4, or murmurs. Abdomen: Soft, non-tender, non-distended, BS + x 4.  Extremities: No clubbing, cyanosis or edema. DP/PT/Radials 2+ and equal bilaterally.  R wrist w/o bleeding/bruit/hematoma.  Accessory Clinical Findings  CBC  Recent Labs  11/12/14 1433 11/13/14 0108  WBC 13.7* 15.5*  HGB 14.4 13.3  HCT 41.6 38.0*  MCV 92.2 90.3  PLT 197 818   Basic Metabolic Panel  Recent  Labs  11/12/14 0712 11/12/14 1433 11/13/14 0108  NA 136  --  134*  K 4.2  --  4.1  CL 98  --  95*  CO2 26  --  29  GLUCOSE 134*  --  130*  BUN 9  --  13  CREATININE 1.07 1.17 1.24  CALCIUM 9.4  --  8.7   Liver Function Tests  Recent Labs  11/12/14 0712  AST 182*  ALT 39  ALKPHOS 56  BILITOT 1.5*  PROT 8.0  ALBUMIN 4.1    Recent Labs  11/12/14 0712  LIPASE 26   Cardiac Enzymes  Recent Labs  11/12/14 1433 11/12/14 2100 11/13/14 0108  TROPONINI >80.00* 54.85* 42.80*   Hemoglobin A1C  Recent Labs  11/12/14 1433  HGBA1C 5.7*   Fasting Lipid Panel  Recent Labs  11/13/14 0108  CHOL 229*  HDL 43  LDLCALC 159*  TRIG 134  CHOLHDL 5.3   TELE  rsr  Radiology/Studies  Dg Chest 2 View  11/12/2014   CLINICAL DATA:  Chest pain on and off for several days. Initial encounter.  EXAM: CHEST  2 VIEW  COMPARISON:  08/17/2011.  FINDINGS: The heart  size and mediastinal contours are within normal limits. Both lungs are clear except for minor LEFT basilar opacity laterally along the hemidiaphragm, new from priors. Small infiltrate not excluded. No osseous findings.  IMPRESSION: Cannot exclude early LEFT base infiltrate. Findings represent an interval change from 2012.   Electronically Signed   By: Rolla Flatten M.D.   On: 11/12/2014 07:59    ASSESSMENT AND PLAN   1. NSTEMI/CAD: S/p PCI/DES to the distal LCX on 3/2. OTW nonobstructive dzs. No c/p overnight. Ambulated w/o difficulty yesterday.  Cont asa, statin, bb, acei, ticagrelor. He plans on enrolling in cardiac rehab.  D/C today w/ f/u in 1 wk.  2. HTN: Stable. Cont bb/acei/diuretic.  3. HL: LDL 159. Statin naive. Cont high potency statin.  F/u lipids/lft's in 8 wks.  Signed, Murray Hodgkins NP   I have seen, examined and evaluated the patient this AM along with Mr. Sharolyn Douglas.  After reviewing all the available data and chart,  I agree with his findings, examination as well as impression  recommendations.  Doing well Day #2 s/p PCI for NSTEMI.  No arrhythmia or recurrent angina/CHF Normal LV Fxn on Cath & no murmur - could consider OP Echo.  On Dapt, BB, ACE-I & low dose diuretic Will need aggressive lipid Rx. - statin ordered  OK for d/c  HARDING, Leonie Green, M.D., M.S. Interventional Cardiologist   Pager # [phone]

## 2014-11-14 NOTE — Discharge Instructions (Signed)

## 2014-11-14 NOTE — Progress Notes (Signed)
Discharge education completed by RN. Pt and family member received a copy of discharge paperwork and confirm understanding of follow up appointments and discharge medications. Both deny any questions at this time. IV removed, site is within normal limits. Pt will discharge from the unit via wheelchair. 

## 2014-11-14 NOTE — Discharge Summary (Signed)
Discharge Summary   Patient ID: Leonard Rodriguez,  MRN: [id number], DOB/AGE: March 26, 1946 69 y.o.  Admit date: 11/12/2014 Discharge date: 11/14/2014  Primary Care Provider:  Melinda Crutch Primary Cardiologist: Jerilynn Mages. Burt Knack, MD   Discharge Diagnoses Principal Problem:   NSTEMI (non-ST elevated myocardial infarction)  **s/p PCI/DES to the occluded Left Circumflex this admission.  Active Problems:   CAD (coronary artery disease)   HTN (hypertension)   Hyperlipidemia  Allergies No Known Allergies  Procedures  Cardiac Catheterization and Percutaneous Coronary Intervention 3.2.2016  PROCEDURAL FINDINGS Hemodynamics: AO 142/64 LV 139/14              Coronary angiography: Coronary dominance: right  Left mainstem: The left main is patent with no obstructive disease. The vessel divides into the LAD and left circumflex.  Left anterior descending (LAD): The LAD has diffuse irregularity without significant stenosis. Proximal LAD is widely patent. The mid LAD after the first diagonal has diffuse irregularities. The first diagonal has 30-40% stenosis. The mid and distal LAD are widely patent without stenosis.  Left circumflex (LCx): The left circumflex is patent proximally. The first obtuse marginal branches 1 and 2 are patent. There are mild ostial stenoses of about 30-40%. The distal AV circumflex beyond the second obtuse marginal branch is totally occluded with faint filling of the third OM branch.   **The distal LCX was successfully stented using a 3.5 x 18 mm Resolute DES.**  Right coronary artery (RCA): The RCA has a high, anterior origin. The proximal vessel has 50% stenosis. The mid vessel is patent. The acute marginal branch is patent. There is a small PDA branch with no significant stenosis. There is a faint collateral to the left circumflex present.  Left ventriculography: The basal inferior wall is severely hypokinetic. The other LV wall segments contract normally. The LVEF is estimated  at 55%. There is mild mitral regurgitation present. _____________   History of Present Illness  69 y/o male without a prior cardiac history.  He was in his usual state of health until a few day prior to admission when he began to experience exertional chest tightness.  C/P returned at rest on the evening prior to admission prompting presentation to the ED.  There, he was found to have subtle inferior ST elevation and continued to have stuttering chest pain. Decision was made to perform urgent diagnostic catheterization.  Hospital Course  Patient was taken directly to the cardiac catheterization laboratory and underwent diagnostic catheterization revealing occlusive disease of the distal left circumflex with otherwise non-obstructive CAD and normal LV function.  The LCX was successfully intervened upon using a 3.5 x 18 mm Resolute DES.  He tolerated the procedure well and was monitored in stepdown afterwards.  There, he eventually peaked his troponin at > 80.  He was placed on asa, brilinta, bb, acei, and statin therapy.  He had no further chest pain and was seen by cardiac rehab and has been ambulating without difficulty.  He will be discharged home today in good condition.  We will arrange for follow-up next week and he plans to participate in outpatient cardiac rehab.  Discharge Vitals Blood pressure 102/65, pulse 78, temperature 99.1 F (37.3 C), temperature source Oral, resp. rate 18, height 5\' 11"  (1.803 m), weight 207 lb 0.2 oz (93.9 kg), SpO2 99 %.  Filed Weights   11/12/14 0704 11/12/14 1110 11/13/14 0023  Weight: 200 lb (90.719 kg) 205 lb 4 oz (93.1 kg) 207 lb 0.2 oz (93.9 kg)  Labs  CBC  Recent Labs  11/12/14 1433 11/13/14 0108  WBC 13.7* 15.5*  HGB 14.4 13.3  HCT 41.6 38.0*  MCV 92.2 90.3  PLT 197 619   Basic Metabolic Panel  Recent Labs  11/12/14 0712 11/12/14 1433 11/13/14 0108  NA 136  --  134*  K 4.2  --  4.1  CL 98  --  95*  CO2 26  --  29  GLUCOSE 134*   --  130*  BUN 9  --  13  CREATININE 1.07 1.17 1.24  CALCIUM 9.4  --  8.7   Liver Function Tests  Recent Labs  11/12/14 0712  AST 182*  ALT 39  ALKPHOS 56  BILITOT 1.5*  PROT 8.0  ALBUMIN 4.1    Recent Labs  11/12/14 0712  LIPASE 26   Cardiac Enzymes  Recent Labs  11/12/14 1433 11/12/14 2100 11/13/14 0108  TROPONINI >80.00* 54.85* 42.80*   Hemoglobin A1C  Recent Labs  11/12/14 1433  HGBA1C 5.7*   Fasting Lipid Panel  Recent Labs  11/13/14 0108  CHOL 229*  HDL 43  LDLCALC 159*  TRIG 134  CHOLHDL 5.3   Disposition  Pt is being discharged home today in good condition.  Follow-up Plans & Appointments      Follow-up Information    Follow up with Eileen Stanford, PA-C On 11/21/2014.   Specialty:  Cardiology   Why:  10:00 - Dr. Antionette Char PA   Contact information:   [address] [place] [address] [phone]       Follow up with  Melinda Crutch, MD.   Specialty:  Family Medicine   Why:  as scheduled.   Contact information:   [address] [place] [address] [phone]       Discharge Medications    Medication List    TAKE these medications        aspirin 81 MG chewable tablet  Chew 1 tablet (81 mg total) by mouth daily.     atorvastatin 80 MG tablet  Commonly known as:  LIPITOR  Take 1 tablet (80 mg total) by mouth daily at 6 PM.     benazepril-hydrochlorthiazide 20-25 MG per tablet  Commonly known as:  LOTENSIN HCT  Take 1 tablet by mouth daily.     Fish Oil 1200 MG Caps  Take 2,400 mg by mouth daily.     metoprolol tartrate 25 MG tablet  Commonly known as:  LOPRESSOR  Take 0.5 tablets (12.5 mg total) by mouth 2 (two) times daily.     multivitamin with minerals tablet  Take 2 tablets by mouth daily.     nitroGLYCERIN 0.4 MG SL tablet  Commonly known as:  NITROSTAT  Place 1 tablet (0.4 mg total) under the tongue every 5 (five) minutes x 3 doses as needed for chest pain.     ticagrelor 90  MG Tabs tablet  Commonly known as:  BRILINTA  Take 1 tablet (90 mg total) by mouth 2 (two) times daily.     vitamin C 1000 MG tablet  Take 1,000 mg by mouth daily.        Outstanding Labs/Studies  Follow-up lipids/lft's in 8 wks.  Duration of Discharge Encounter   Greater than 30 minutes including physician time.  Signed, Murray Hodgkins NP 11/14/2014, 9:36 AM   I have seen, examined and evaluated the patient this AM along with Mr. Sharolyn Douglas. After reviewing all the available data and chart, I agree  with his summary of events.  Doing well Day #2 s/p PCI for NSTEMI. No arrhythmia or recurrent angina/CHF Normal LV Fxn on Cath & no murmur - could consider OP Echo.  On Dapt, BB, ACE-I & low dose diuretic Will need aggressive lipid Rx. - statin ordered  He is ready for discharge.  Leonie Man, M.D., M.S. Interventional Cardiologist   Pager # [phone]

## 2014-11-14 NOTE — Telephone Encounter (Signed)
New message       TCM appt on 11-21-14 per Ignacia Bayley

## 2014-11-14 NOTE — Progress Notes (Signed)
Pt lying quietly in bed, family members are at bedside, pt denies needs at this time, he appears comfortable and in no acute distress, will continue to monitor pt and assist as needed.

## 2014-11-17 NOTE — Telephone Encounter (Signed)
Patient contacted regarding discharge from [facility] on 11/14/2014  Patient understands to follow up with provider ? With Angelena Form won 3/11 @ 10;  Patient understands discharge instructions? Yes Patient understands medications and regiment? Yes, picked up all medications on Friday after discharge Patient understands to bring all medications to this visit? Yes   Pt feeling well and voiced he understood his medications and his upcoming appointment.

## 2014-11-21 ENCOUNTER — Encounter: Payer: Self-pay | Admitting: Physician Assistant

## 2014-11-21 ENCOUNTER — Ambulatory Visit (INDEPENDENT_AMBULATORY_CARE_PROVIDER_SITE_OTHER): Payer: Medicare Other | Admitting: Physician Assistant

## 2014-11-21 VITALS — BP 124/82 | HR 58 | Ht 71.0 in | Wt 198.6 lb

## 2014-11-21 DIAGNOSIS — I10 Essential (primary) hypertension: Secondary | ICD-10-CM

## 2014-11-21 DIAGNOSIS — E785 Hyperlipidemia, unspecified: Secondary | ICD-10-CM

## 2014-11-21 DIAGNOSIS — I252 Old myocardial infarction: Secondary | ICD-10-CM | POA: Diagnosis not present

## 2014-11-21 DIAGNOSIS — I2510 Atherosclerotic heart disease of native coronary artery without angina pectoris: Secondary | ICD-10-CM

## 2014-11-21 NOTE — Progress Notes (Signed)
Cardiology Office Note   Date:  11/21/2014   ID:  Leonard Rodriguez, DOB 1946-08-16, MRN [id number]  PCP:   Melinda Crutch, MD  Cardiologist:  Dr. Sherren Mocha     Chief Complaint  Patient presents with  . Coronary Artery Disease    s/p NSTEMI >> PCI: DES to CFx  . Hospitalization Follow-up     History of Present Illness: Leonard Rodriguez is a 69 y.o. male who was admitted 3/2-3/4 with NSTEMI.  He had subtle inferior STE and continued CP upon presentation and was brought to the cath lab urgently.  LHC demonstrated an occluded CFX that was treated with a DES.  EF was preserved.  Post PCI course uneventful.  He returns for FU.  He is tired.  But, otherwise doing well.  The patient denies chest pain, shortness of breath, syncope, orthopnea, PND or significant pedal edema.   Studies/Reports Reviewed Today:  Cardiac Cath/PCI 11/12/14 LM:  patent with no obstructive disease.  LAD: D1 30-40% stenosis.  LCx: OM1 and OM2 mild ostial stenoses of about 30-40%. Distal AVCFX totally occluded  RCA: Prox 50%.  Faint collateral to the left circumflex present. LVEF 55%.  PCI: 3.5 x 18 mm resolute DES. to distal LCx   Past Medical History  Diagnosis Date  . Recurrent upper respiratory infection (URI)     current cold being treated with otc meds.  . Coronary artery disease     a. 11/2014 NSTEMI/PCI: LM nl, LAD min irregs, D1 30-40, LCX 30-30m, 100d (3.5x18 Resolute DES), OM1/2 nl, RCA 50p, R->L collats, EF 55%, mild MR.  [name] Hypertension     put on bp meds. when he was heavier and was not exercising  . Osteoarthritis of right hip   . Hyperlipidemia     Past Surgical History  Procedure Laterality Date  . Vasectomy    . Total hip arthroplasty  08/30/2011    Procedure: TOTAL HIP ARTHROPLASTY;  Surgeon: Garald Balding, MD;  Location: [facility];  Service: Orthopedics;  Laterality: Right;  . Coronary angioplasty with stent placement  11/12/2014    "1"  . Joint replacement    . Tonsillectomy  1950's    . Left heart catheterization with coronary angiogram N/A 11/12/2014    Procedure: LEFT HEART CATHETERIZATION WITH CORONARY ANGIOGRAM;  Surgeon: Blane Ohara, MD;  Location: [facility] CATH LAB;  Service: Cardiovascular;  Laterality: N/A;     Current Outpatient Prescriptions  Medication Sig Dispense Refill  . Ascorbic Acid (VITAMIN C) 1000 MG tablet Take 1,000 mg by mouth daily.    [name] aspirin 81 MG chewable tablet Chew 1 tablet (81 mg total) by mouth daily.    [name] atorvastatin (LIPITOR) 80 MG tablet Take 1 tablet (80 mg total) by mouth daily at 6 PM. 30 tablet 6  . benazepril-hydrochlorthiazide (LOTENSIN HCT) 20-25 MG per tablet Take 1 tablet by mouth daily.    . metoprolol tartrate (LOPRESSOR) 25 MG tablet Take 0.5 tablets (12.5 mg total) by mouth 2 (two) times daily. 30 tablet 6  . Multiple Vitamins-Minerals (MULTIVITAMIN WITH MINERALS) tablet Take 2 tablets by mouth daily.    . nitroGLYCERIN (NITROSTAT) 0.4 MG SL tablet Place 1 tablet (0.4 mg total) under the tongue every 5 (five) minutes x 3 doses as needed for chest pain. 25 tablet 3  . Omega-3 Fatty Acids (FISH OIL) 1200 MG CAPS Take 2,400 mg by mouth daily.    . ticagrelor (BRILINTA) 90 MG TABS tablet Take 1 tablet (  90 mg total) by mouth 2 (two) times daily. 60 tablet 6   No current facility-administered medications for this visit.    Allergies:   Review of patient's allergies indicates no known allergies.    Social History:  The patient  reports that he has never smoked. He has never used smokeless tobacco. He reports that he drinks alcohol. He reports that he does not use illicit drugs.   Family History:  The patient's family history includes Lung cancer in his father. There is no history of Heart attack.    ROS:   Please see the history of present illness.   Review of Systems  Constitution: Negative for fever.  Respiratory: Negative for cough.   Gastrointestinal: Negative for hematochezia.  Genitourinary: Negative for hematuria.   All other systems reviewed and are negative.    PHYSICAL EXAM: VS:  BP 124/82 mmHg  Pulse 58  Ht 5\' 11"  (1.803 m)  Wt 198 lb 9.6 oz (90.084 kg)  BMI 27.71 kg/m2  SpO2 98%    Wt Readings from Last 3 Encounters:  11/21/14 198 lb 9.6 oz (90.084 kg)  11/13/14 207 lb 0.2 oz (93.9 kg)  08/17/11 183 lb 13.8 oz (83.4 kg)     GEN: Well nourished, well developed, in no acute distress HEENT: normal Neck: no JVD, no masses Cardiac:  Normal S1/S2, RRR; no murmur ,  no rubs or gallops, no edema; right wrist without hematoma or mass  Respiratory:  clear to auscultation bilaterally, no wheezing, rhonchi or rales. GI: soft, nontender, nondistended, + BS MS: no deformity or atrophy Skin: warm and dry  Neuro:  CNs II-XII intact, Strength and sensation are intact Psych: Normal affect   EKG:  EKG is ordered today.  It demonstrates:   Sinus brady, HR 52, inf Q waves, TWI 3, aVF, V6   Recent Labs: 11/12/2014: ALT 39; B Natriuretic Peptide 316.3* 11/13/2014: BUN 13; Creatinine 1.24; Hemoglobin 13.3; Platelets 207; Potassium 4.1; Sodium 134*    Lipid Panel    Component Value Date/Time   CHOL 229* 11/13/2014 0108   TRIG 134 11/13/2014 0108   HDL 43 11/13/2014 0108   CHOLHDL 5.3 11/13/2014 0108   VLDL 27 11/13/2014 0108   LDLCALC 159* 11/13/2014 0108      ASSESSMENT AND PLAN:  Coronary artery disease involving native coronary artery of native heart without angina pectoris:  Doing well after recent NSTEMI tx with DES to CFX.  We discussed the importance of dual antiplatelet therapy. He was previously very active and lifted weights regularly.  I have recommended he hold off on trying to do his weight regimen for 3 mos post MI.  He is interested in cardiac rehab.      -  Continue ASA, Brilinta, beta-blocker, ACE inhibitor, statin.    -  Refer to Cardiac Rehab.  History of non-ST elevation myocardial infarction (NSTEMI):  Continue beta-blocker, ACE inhibitor, statin, DAP therapy.  Arrange FU  echo.  Essential hypertension:  Controlled.    Hyperlipidemia:  Continue statin. Check Lipids and LFTs in 6 weeks.     Current medicines are reviewed at length with the patient today.  The patient does not have concerns regarding medicines.  The following changes have been made:  As above.  Labs/ tests ordered today include:   Orders Placed This Encounter  Procedures  . Hepatic function panel  . Lipid panel  . EKG 12-Lead  . 2D Echocardiogram without contrast     Disposition:   FU with  Dr. Sherren Mocha 6-8 weeks.    Signed, Versie Starks, MHS 11/21/2014 8:18 AM    [facility] [address], [place], [place]  [address] Phone: [phone]; Fax: [phone]

## 2014-11-21 NOTE — Patient Instructions (Signed)
We will send a referral to Cardiac Rehab.  They will contact you.  Labs 6 weeks from now:  Lipid Panel, Hepatic Function Panel.  Continue all of your current medications.  Your physician has requested that you have an echocardiogram. Echocardiography is a painless test that uses sound waves to create images of your heart. It provides your doctor with information about the size and shape of your heart and how well your heart's chambers and valves are working. This procedure takes approximately one hour. There are no restrictions for this procedure.   Schedule follow up with Dr. Sherren Mocha 6-8 weeks.

## 2014-11-25 DIAGNOSIS — I2510 Atherosclerotic heart disease of native coronary artery without angina pectoris: Secondary | ICD-10-CM | POA: Diagnosis not present

## 2014-11-25 DIAGNOSIS — I10 Essential (primary) hypertension: Secondary | ICD-10-CM | POA: Diagnosis not present

## 2014-11-25 DIAGNOSIS — E780 Pure hypercholesterolemia: Secondary | ICD-10-CM | POA: Diagnosis not present

## 2014-11-26 ENCOUNTER — Encounter: Payer: Self-pay | Admitting: Physician Assistant

## 2014-11-26 ENCOUNTER — Ambulatory Visit (HOSPITAL_COMMUNITY): Payer: Medicare Other | Attending: Cardiovascular Disease | Admitting: Radiology

## 2014-11-26 DIAGNOSIS — I2510 Atherosclerotic heart disease of native coronary artery without angina pectoris: Secondary | ICD-10-CM | POA: Insufficient documentation

## 2014-11-26 DIAGNOSIS — I340 Nonrheumatic mitral (valve) insufficiency: Secondary | ICD-10-CM | POA: Insufficient documentation

## 2014-11-26 DIAGNOSIS — I252 Old myocardial infarction: Secondary | ICD-10-CM | POA: Diagnosis not present

## 2014-11-26 DIAGNOSIS — E785 Hyperlipidemia, unspecified: Secondary | ICD-10-CM | POA: Diagnosis not present

## 2014-11-26 DIAGNOSIS — I10 Essential (primary) hypertension: Secondary | ICD-10-CM | POA: Diagnosis not present

## 2014-11-26 MED ORDER — PERFLUTREN LIPID MICROSPHERE
3.0000 mL | Freq: Once | INTRAVENOUS | Status: AC
  Administered 2014-11-26: 3 mL via INTRAVENOUS

## 2014-11-26 NOTE — Progress Notes (Signed)
Echocardiogram performed [facility]

## 2014-12-04 ENCOUNTER — Encounter (HOSPITAL_COMMUNITY)
Admission: RE | Admit: 2014-12-04 | Discharge: 2014-12-04 | Disposition: A | Discharge status: Home / Self Care | Payer: Medicare Other | Source: Ambulatory Visit | Attending: Cardiovascular Disease | Admitting: Cardiovascular Disease

## 2014-12-04 DIAGNOSIS — Z955 Presence of coronary angioplasty implant and graft: Secondary | ICD-10-CM | POA: Insufficient documentation

## 2014-12-04 DIAGNOSIS — Z48812 Encounter for surgical aftercare following surgery on the circulatory system: Secondary | ICD-10-CM | POA: Insufficient documentation

## 2014-12-04 DIAGNOSIS — I2510 Atherosclerotic heart disease of native coronary artery without angina pectoris: Secondary | ICD-10-CM | POA: Insufficient documentation

## 2014-12-04 DIAGNOSIS — I252 Old myocardial infarction: Secondary | ICD-10-CM | POA: Insufficient documentation

## 2014-12-04 NOTE — Progress Notes (Signed)
Cardiac Rehab Medication Review by a Pharmacist  Does the patient  feel that his/her medications are working for him/her?  yes  Has the patient been experiencing any side effects to the medications prescribed?  no  Does the patient measure his/her own blood pressure or blood glucose at home?  yes   Does the patient have any problems obtaining medications due to transportation or finances?   no  Understanding of regimen: excellent Understanding of indications: excellent Potential of compliance: excellent    Pharmacist comments: Pleasant 61 YOM presenting to cardiac rehab.  Patient appears very aware of his medication regimen.  No side effects reported.  Measures BP QID, appears within goal.  HR as patient reports slightly low.  No financial or travel barriers.  No further questions at this time.    Hassie Bruce, Pharm. D. Clinical Pharmacy Resident Pager: [phone] Ph: [phone] 12/04/2014 8:35 AM

## 2014-12-08 ENCOUNTER — Encounter (HOSPITAL_COMMUNITY)
Admission: RE | Admit: 2014-12-08 | Discharge: 2014-12-08 | Disposition: A | Discharge status: Home / Self Care | Payer: Medicare Other | Source: Ambulatory Visit | Attending: Cardiovascular Disease | Admitting: Cardiovascular Disease

## 2014-12-08 DIAGNOSIS — Z955 Presence of coronary angioplasty implant and graft: Secondary | ICD-10-CM | POA: Diagnosis not present

## 2014-12-08 DIAGNOSIS — I252 Old myocardial infarction: Secondary | ICD-10-CM | POA: Diagnosis not present

## 2014-12-08 DIAGNOSIS — Z48812 Encounter for surgical aftercare following surgery on the circulatory system: Secondary | ICD-10-CM | POA: Diagnosis not present

## 2014-12-08 DIAGNOSIS — I2510 Atherosclerotic heart disease of native coronary artery without angina pectoris: Secondary | ICD-10-CM | POA: Diagnosis not present

## 2014-12-08 NOTE — Progress Notes (Signed)
Pt started cardiac rehab today.  Pt tolerated light exercise without difficulty. Telemetry rhythm Sinus Brady, Sinus rhythm. Exertional blood pressure noted on the airdyne today at cardiac rehab. Resting blood pressures within normal limits. Will continue to monitor the patient throughout  the program.

## 2014-12-10 ENCOUNTER — Telehealth: Payer: Self-pay | Admitting: *Deleted

## 2014-12-10 ENCOUNTER — Encounter (HOSPITAL_COMMUNITY)
Admission: RE | Admit: 2014-12-10 | Discharge: 2014-12-10 | Disposition: A | Discharge status: Home / Self Care | Payer: Medicare Other | Source: Ambulatory Visit | Attending: Cardiovascular Disease | Admitting: Cardiovascular Disease

## 2014-12-10 DIAGNOSIS — I2510 Atherosclerotic heart disease of native coronary artery without angina pectoris: Secondary | ICD-10-CM | POA: Diagnosis not present

## 2014-12-10 DIAGNOSIS — Z48812 Encounter for surgical aftercare following surgery on the circulatory system: Secondary | ICD-10-CM | POA: Diagnosis not present

## 2014-12-10 DIAGNOSIS — I252 Old myocardial infarction: Secondary | ICD-10-CM | POA: Diagnosis not present

## 2014-12-10 DIAGNOSIS — Z955 Presence of coronary angioplasty implant and graft: Secondary | ICD-10-CM | POA: Diagnosis not present

## 2014-12-10 NOTE — Telephone Encounter (Signed)
I will forward this message to [facility] in Cardiac Rehab to make her aware of Dr Antionette Char comments.

## 2014-12-10 NOTE — Telephone Encounter (Signed)
Verdis Frederickson calls today b/c pt experienced asymptomatic bigeminy pvcs today while participating in cardiac rehab. She states pt is taking lopressor 12.5mg  bid She will fax those strips for Dr. Burt Knack to review in the office today.  Forwarded to [facility] RN Horton Chin RN

## 2014-12-10 NOTE — Telephone Encounter (Signed)
Fax received and placed in Dr Sanmina-[employer] folder for review.

## 2014-12-10 NOTE — Progress Notes (Signed)
Intermittent bigeminal PVC's noted today on the Nustep. Patient asymptomatic. Work load was increased to level 3.0. PVC's subsided after workload decreased to level 1.0. Bigeminal PVC's noted also on the airdyne, nonsustained. Dr Antionette Char office called and notified. Spoke with Horton Chin RN. Vital signs stable. Will fax exercise flow sheets to Dr. Antionette Char  office for review.

## 2014-12-10 NOTE — Telephone Encounter (Signed)
Strips reviewed. Bigeminy noted. Patient was noted to be asymptomatic at that time. Would continue current medicines. Okay to continue exercise with cardiac rehabilitation.

## 2014-12-12 ENCOUNTER — Encounter (HOSPITAL_COMMUNITY)
Admission: RE | Admit: 2014-12-12 | Discharge: 2014-12-12 | Disposition: A | Discharge status: Home / Self Care | Payer: Medicare Other | Source: Ambulatory Visit | Attending: Cardiovascular Disease | Admitting: Cardiovascular Disease

## 2014-12-12 DIAGNOSIS — I252 Old myocardial infarction: Secondary | ICD-10-CM | POA: Insufficient documentation

## 2014-12-12 DIAGNOSIS — Z48812 Encounter for surgical aftercare following surgery on the circulatory system: Secondary | ICD-10-CM | POA: Insufficient documentation

## 2014-12-12 DIAGNOSIS — Z955 Presence of coronary angioplasty implant and graft: Secondary | ICD-10-CM | POA: Diagnosis not present

## 2014-12-12 DIAGNOSIS — I2510 Atherosclerotic heart disease of native coronary artery without angina pectoris: Secondary | ICD-10-CM | POA: Diagnosis not present

## 2014-12-15 ENCOUNTER — Encounter (HOSPITAL_COMMUNITY)
Admission: RE | Admit: 2014-12-15 | Discharge: 2014-12-15 | Disposition: A | Discharge status: Home / Self Care | Payer: Medicare Other | Source: Ambulatory Visit | Attending: Cardiovascular Disease | Admitting: Cardiovascular Disease

## 2014-12-15 DIAGNOSIS — Z48812 Encounter for surgical aftercare following surgery on the circulatory system: Secondary | ICD-10-CM | POA: Diagnosis not present

## 2014-12-15 DIAGNOSIS — I252 Old myocardial infarction: Secondary | ICD-10-CM | POA: Diagnosis not present

## 2014-12-15 DIAGNOSIS — Z955 Presence of coronary angioplasty implant and graft: Secondary | ICD-10-CM | POA: Diagnosis not present

## 2014-12-15 DIAGNOSIS — I2510 Atherosclerotic heart disease of native coronary artery without angina pectoris: Secondary | ICD-10-CM | POA: Diagnosis not present

## 2014-12-17 ENCOUNTER — Encounter (HOSPITAL_COMMUNITY)
Admission: RE | Admit: 2014-12-17 | Discharge: 2014-12-17 | Disposition: A | Discharge status: Home / Self Care | Payer: Medicare Other | Source: Ambulatory Visit | Attending: Cardiovascular Disease | Admitting: Cardiovascular Disease

## 2014-12-17 DIAGNOSIS — Z48812 Encounter for surgical aftercare following surgery on the circulatory system: Secondary | ICD-10-CM | POA: Diagnosis not present

## 2014-12-17 DIAGNOSIS — I252 Old myocardial infarction: Secondary | ICD-10-CM | POA: Diagnosis not present

## 2014-12-17 DIAGNOSIS — I2510 Atherosclerotic heart disease of native coronary artery without angina pectoris: Secondary | ICD-10-CM | POA: Diagnosis not present

## 2014-12-17 DIAGNOSIS — Z955 Presence of coronary angioplasty implant and graft: Secondary | ICD-10-CM | POA: Diagnosis not present

## 2014-12-17 NOTE — Progress Notes (Signed)
Academic intern reviewed home exercise with pt today.  Pt plans to walk at home and go [facility] for exercise.  Reviewed THR, pulse, RPE, sign and symptoms, NTG use, and when to call 911 or MD.  Pt voiced understanding. Shirlyn Goltz, Academic Intern Alberteen Sam, MA, ACSM RCEP

## 2014-12-19 ENCOUNTER — Encounter (HOSPITAL_COMMUNITY)
Admission: RE | Admit: 2014-12-19 | Discharge: 2014-12-19 | Disposition: A | Discharge status: Home / Self Care | Payer: Medicare Other | Source: Ambulatory Visit | Attending: Cardiovascular Disease | Admitting: Cardiovascular Disease

## 2014-12-19 DIAGNOSIS — Z955 Presence of coronary angioplasty implant and graft: Secondary | ICD-10-CM | POA: Diagnosis not present

## 2014-12-19 DIAGNOSIS — I252 Old myocardial infarction: Secondary | ICD-10-CM | POA: Diagnosis not present

## 2014-12-19 DIAGNOSIS — Z48812 Encounter for surgical aftercare following surgery on the circulatory system: Secondary | ICD-10-CM | POA: Diagnosis not present

## 2014-12-19 DIAGNOSIS — I2510 Atherosclerotic heart disease of native coronary artery without angina pectoris: Secondary | ICD-10-CM | POA: Diagnosis not present

## 2014-12-22 ENCOUNTER — Encounter (HOSPITAL_COMMUNITY)
Admission: RE | Admit: 2014-12-22 | Discharge: 2014-12-22 | Disposition: A | Discharge status: Home / Self Care | Payer: Medicare Other | Source: Ambulatory Visit | Attending: Cardiovascular Disease | Admitting: Cardiovascular Disease

## 2014-12-22 DIAGNOSIS — I2510 Atherosclerotic heart disease of native coronary artery without angina pectoris: Secondary | ICD-10-CM | POA: Diagnosis not present

## 2014-12-22 DIAGNOSIS — Z955 Presence of coronary angioplasty implant and graft: Secondary | ICD-10-CM | POA: Diagnosis not present

## 2014-12-22 DIAGNOSIS — Z48812 Encounter for surgical aftercare following surgery on the circulatory system: Secondary | ICD-10-CM | POA: Diagnosis not present

## 2014-12-22 DIAGNOSIS — I252 Old myocardial infarction: Secondary | ICD-10-CM | POA: Diagnosis not present

## 2014-12-24 ENCOUNTER — Encounter (HOSPITAL_COMMUNITY)
Admission: RE | Admit: 2014-12-24 | Discharge: 2014-12-24 | Disposition: A | Discharge status: Home / Self Care | Payer: Medicare Other | Source: Ambulatory Visit | Attending: Cardiovascular Disease | Admitting: Cardiovascular Disease

## 2014-12-24 DIAGNOSIS — Z48812 Encounter for surgical aftercare following surgery on the circulatory system: Secondary | ICD-10-CM | POA: Diagnosis not present

## 2014-12-24 DIAGNOSIS — I252 Old myocardial infarction: Secondary | ICD-10-CM | POA: Diagnosis not present

## 2014-12-24 DIAGNOSIS — I2510 Atherosclerotic heart disease of native coronary artery without angina pectoris: Secondary | ICD-10-CM | POA: Diagnosis not present

## 2014-12-24 DIAGNOSIS — Z955 Presence of coronary angioplasty implant and graft: Secondary | ICD-10-CM | POA: Diagnosis not present

## 2014-12-25 ENCOUNTER — Encounter: Payer: Self-pay | Admitting: Cardiovascular Disease

## 2014-12-25 NOTE — Progress Notes (Addendum)
Patient was noted to have frequent bigeminal couplets while exercising on the airdyne at cardiac rehab yesterday. Leonard Rodriguez was working out at level 3.0 on [employer] and was asymptomatic. Blood pressure 144/80 patient asymptomatic. I asked Leonard Rodriguez to decrease his workload. PVC's improved when patient got off the bike and moved to the tread mill.  Leonard Range PA called and notified. No new orders received. ECG tracings from cardiac rehab with exercise flow sheets faxed to Dr Antionette Char office for review. Dr Cooper's RN Leonard Rodriguez also notified about ectopy. Will continue to monitor the patient. Late entry for 12/24/2014 at 10:10 AM.

## 2014-12-25 NOTE — Telephone Encounter (Signed)
New Message  Verdis Frederickson- cardiac rehab calling to speak w/ Lauren. Was not specific. Please call back and discuss.

## 2014-12-25 NOTE — Telephone Encounter (Signed)
This encounter was created in error - please disregard.

## 2014-12-26 ENCOUNTER — Encounter (HOSPITAL_COMMUNITY)
Admission: RE | Admit: 2014-12-26 | Discharge: 2014-12-26 | Disposition: A | Discharge status: Home / Self Care | Payer: Medicare Other | Source: Ambulatory Visit | Attending: Cardiovascular Disease | Admitting: Cardiovascular Disease

## 2014-12-26 DIAGNOSIS — Z48812 Encounter for surgical aftercare following surgery on the circulatory system: Secondary | ICD-10-CM | POA: Diagnosis not present

## 2014-12-26 DIAGNOSIS — I2510 Atherosclerotic heart disease of native coronary artery without angina pectoris: Secondary | ICD-10-CM | POA: Diagnosis not present

## 2014-12-26 DIAGNOSIS — I252 Old myocardial infarction: Secondary | ICD-10-CM | POA: Diagnosis not present

## 2014-12-26 DIAGNOSIS — Z955 Presence of coronary angioplasty implant and graft: Secondary | ICD-10-CM | POA: Diagnosis not present

## 2014-12-29 ENCOUNTER — Encounter (HOSPITAL_COMMUNITY)
Admission: RE | Admit: 2014-12-29 | Discharge: 2014-12-29 | Disposition: A | Discharge status: Home / Self Care | Payer: Medicare Other | Source: Ambulatory Visit | Attending: Cardiovascular Disease | Admitting: Cardiovascular Disease

## 2014-12-29 DIAGNOSIS — Z955 Presence of coronary angioplasty implant and graft: Secondary | ICD-10-CM | POA: Diagnosis not present

## 2014-12-29 DIAGNOSIS — I2510 Atherosclerotic heart disease of native coronary artery without angina pectoris: Secondary | ICD-10-CM | POA: Diagnosis not present

## 2014-12-29 DIAGNOSIS — I252 Old myocardial infarction: Secondary | ICD-10-CM | POA: Diagnosis not present

## 2014-12-29 DIAGNOSIS — Z48812 Encounter for surgical aftercare following surgery on the circulatory system: Secondary | ICD-10-CM | POA: Diagnosis not present

## 2014-12-31 ENCOUNTER — Encounter (HOSPITAL_COMMUNITY)
Admission: RE | Admit: 2014-12-31 | Discharge: 2014-12-31 | Disposition: A | Discharge status: Home / Self Care | Payer: Medicare Other | Source: Ambulatory Visit | Attending: Cardiovascular Disease | Admitting: Cardiovascular Disease

## 2014-12-31 DIAGNOSIS — I252 Old myocardial infarction: Secondary | ICD-10-CM | POA: Diagnosis not present

## 2014-12-31 DIAGNOSIS — Z955 Presence of coronary angioplasty implant and graft: Secondary | ICD-10-CM | POA: Diagnosis not present

## 2014-12-31 DIAGNOSIS — I2510 Atherosclerotic heart disease of native coronary artery without angina pectoris: Secondary | ICD-10-CM | POA: Diagnosis not present

## 2014-12-31 DIAGNOSIS — Z48812 Encounter for surgical aftercare following surgery on the circulatory system: Secondary | ICD-10-CM | POA: Diagnosis not present

## 2015-01-02 ENCOUNTER — Encounter (HOSPITAL_COMMUNITY)
Admission: RE | Admit: 2015-01-02 | Discharge: 2015-01-02 | Disposition: A | Discharge status: Home / Self Care | Payer: Medicare Other | Source: Ambulatory Visit | Attending: Cardiovascular Disease | Admitting: Cardiovascular Disease

## 2015-01-02 DIAGNOSIS — Z48812 Encounter for surgical aftercare following surgery on the circulatory system: Secondary | ICD-10-CM | POA: Diagnosis not present

## 2015-01-02 DIAGNOSIS — I2510 Atherosclerotic heart disease of native coronary artery without angina pectoris: Secondary | ICD-10-CM | POA: Diagnosis not present

## 2015-01-02 DIAGNOSIS — I252 Old myocardial infarction: Secondary | ICD-10-CM | POA: Diagnosis not present

## 2015-01-02 DIAGNOSIS — Z955 Presence of coronary angioplasty implant and graft: Secondary | ICD-10-CM | POA: Diagnosis not present

## 2015-01-05 ENCOUNTER — Encounter (HOSPITAL_COMMUNITY)
Admission: RE | Admit: 2015-01-05 | Discharge: 2015-01-05 | Disposition: A | Discharge status: Home / Self Care | Payer: Medicare Other | Source: Ambulatory Visit | Attending: Cardiovascular Disease | Admitting: Cardiovascular Disease

## 2015-01-05 DIAGNOSIS — I2510 Atherosclerotic heart disease of native coronary artery without angina pectoris: Secondary | ICD-10-CM | POA: Diagnosis not present

## 2015-01-05 DIAGNOSIS — Z955 Presence of coronary angioplasty implant and graft: Secondary | ICD-10-CM | POA: Diagnosis not present

## 2015-01-05 DIAGNOSIS — I252 Old myocardial infarction: Secondary | ICD-10-CM | POA: Diagnosis not present

## 2015-01-05 DIAGNOSIS — Z48812 Encounter for surgical aftercare following surgery on the circulatory system: Secondary | ICD-10-CM | POA: Diagnosis not present

## 2015-01-05 NOTE — Progress Notes (Signed)
Roselie Skinner 69 y.o. male Nutrition Note Spoke with pt.  Nutrition Survey reviewed with pt. Pt is following Step 2 of the Therapeutic Lifestyle Changes diet. Pt wants to lose wt. Pt has been trying to lose wt by increasing exercise, and consuming more fruits and vegetables. Wt loss tips reviewed. Pt expressed understanding of the information reviewed. Pt aware of nutrition education classes offered.  Nutrition Diagnosis ? Food-and nutrition-related knowledge deficit related to lack of exposure to information as related to diagnosis of: ? CVD   Nutrition Intervention ? Benefits of adopting Therapeutic Lifestyle Changes discussed when Medficts reviewed. ? Pt to attend the  ? Nutrition I class                     ? Nutrition II class ? Pt given handouts for: ? Nutrition I class ? Nutrition II class ? Continue client-centered nutrition education by RD, as part of interdisciplinary care.  Goal(s) ? Pt to describe the potential benefits of adopting Therapeutic Lifestyle Changes ? Pt to identify food quantities necessary to achieve: ? wt loss to a goal wt of 180-190 lbs (81.9-86.5 kg) at graduation from cardiac rehab.  ? Pt to describe the benefit of including fruits, vegetables, whole grains, and low-fat dairy products in a heart healthy meal plan.   Monitor and Evaluate progress toward nutrition goal with team.  Wynona Dove, MS Dietetic Intern  Derek Mound, M.Ed, RD, LDN, CDE 01/05/2015 3:31 PM

## 2015-01-07 ENCOUNTER — Encounter (HOSPITAL_COMMUNITY)
Admission: RE | Admit: 2015-01-07 | Discharge: 2015-01-07 | Disposition: A | Discharge status: Home / Self Care | Payer: Medicare Other | Source: Ambulatory Visit | Attending: Cardiovascular Disease | Admitting: Cardiovascular Disease

## 2015-01-07 DIAGNOSIS — I2510 Atherosclerotic heart disease of native coronary artery without angina pectoris: Secondary | ICD-10-CM | POA: Diagnosis not present

## 2015-01-07 DIAGNOSIS — Z48812 Encounter for surgical aftercare following surgery on the circulatory system: Secondary | ICD-10-CM | POA: Diagnosis not present

## 2015-01-07 DIAGNOSIS — Z955 Presence of coronary angioplasty implant and graft: Secondary | ICD-10-CM | POA: Diagnosis not present

## 2015-01-07 DIAGNOSIS — I252 Old myocardial infarction: Secondary | ICD-10-CM | POA: Diagnosis not present

## 2015-01-09 ENCOUNTER — Encounter (HOSPITAL_COMMUNITY)
Admission: RE | Admit: 2015-01-09 | Discharge: 2015-01-09 | Disposition: A | Discharge status: Home / Self Care | Payer: Medicare Other | Source: Ambulatory Visit | Attending: Cardiovascular Disease | Admitting: Cardiovascular Disease

## 2015-01-09 DIAGNOSIS — Z955 Presence of coronary angioplasty implant and graft: Secondary | ICD-10-CM | POA: Diagnosis not present

## 2015-01-09 DIAGNOSIS — I2510 Atherosclerotic heart disease of native coronary artery without angina pectoris: Secondary | ICD-10-CM | POA: Diagnosis not present

## 2015-01-09 DIAGNOSIS — I252 Old myocardial infarction: Secondary | ICD-10-CM | POA: Diagnosis not present

## 2015-01-09 DIAGNOSIS — Z48812 Encounter for surgical aftercare following surgery on the circulatory system: Secondary | ICD-10-CM | POA: Diagnosis not present

## 2015-01-12 ENCOUNTER — Encounter: Payer: Self-pay | Admitting: Cardiovascular Disease

## 2015-01-12 ENCOUNTER — Encounter (HOSPITAL_COMMUNITY)
Admission: RE | Admit: 2015-01-12 | Discharge: 2015-01-12 | Disposition: A | Discharge status: Home / Self Care | Payer: Medicare Other | Source: Ambulatory Visit | Attending: Cardiovascular Disease | Admitting: Cardiovascular Disease

## 2015-01-12 DIAGNOSIS — I2510 Atherosclerotic heart disease of native coronary artery without angina pectoris: Secondary | ICD-10-CM | POA: Insufficient documentation

## 2015-01-12 DIAGNOSIS — I252 Old myocardial infarction: Secondary | ICD-10-CM | POA: Insufficient documentation

## 2015-01-12 DIAGNOSIS — Z955 Presence of coronary angioplasty implant and graft: Secondary | ICD-10-CM | POA: Diagnosis not present

## 2015-01-12 DIAGNOSIS — Z48812 Encounter for surgical aftercare following surgery on the circulatory system: Secondary | ICD-10-CM | POA: Insufficient documentation

## 2015-01-14 ENCOUNTER — Encounter (HOSPITAL_COMMUNITY): Admission: RE | Admit: 2015-01-14 | Payer: Medicare Other | Source: Ambulatory Visit

## 2015-01-14 ENCOUNTER — Other Ambulatory Visit (INDEPENDENT_AMBULATORY_CARE_PROVIDER_SITE_OTHER): Payer: Medicare Other | Admitting: *Deleted

## 2015-01-14 ENCOUNTER — Encounter (HOSPITAL_COMMUNITY)
Admission: RE | Admit: 2015-01-14 | Discharge: 2015-01-14 | Disposition: A | Discharge status: Home / Self Care | Payer: Medicare Other | Source: Ambulatory Visit | Attending: Cardiovascular Disease | Admitting: Cardiovascular Disease

## 2015-01-14 ENCOUNTER — Telehealth: Payer: Self-pay | Admitting: *Deleted

## 2015-01-14 ENCOUNTER — Encounter: Payer: Self-pay | Admitting: Cardiovascular Disease

## 2015-01-14 ENCOUNTER — Ambulatory Visit (INDEPENDENT_AMBULATORY_CARE_PROVIDER_SITE_OTHER): Payer: Medicare Other | Admitting: Cardiovascular Disease

## 2015-01-14 VITALS — BP 120/66 | HR 48 | Ht 71.0 in | Wt 187.8 lb

## 2015-01-14 DIAGNOSIS — E785 Hyperlipidemia, unspecified: Secondary | ICD-10-CM

## 2015-01-14 DIAGNOSIS — Z955 Presence of coronary angioplasty implant and graft: Secondary | ICD-10-CM | POA: Diagnosis not present

## 2015-01-14 DIAGNOSIS — I2510 Atherosclerotic heart disease of native coronary artery without angina pectoris: Secondary | ICD-10-CM

## 2015-01-14 DIAGNOSIS — Z48812 Encounter for surgical aftercare following surgery on the circulatory system: Secondary | ICD-10-CM | POA: Diagnosis not present

## 2015-01-14 DIAGNOSIS — I252 Old myocardial infarction: Secondary | ICD-10-CM | POA: Diagnosis not present

## 2015-01-14 LAB — LIPID PANEL
Cholesterol: 105 mg/dL (ref 0–200)
HDL: 37.8 mg/dL — ABNORMAL LOW (ref >39.00)
LDL CALC: 42 mg/dL (ref 0–99)
NonHDL: 67.2
TRIGLYCERIDES: 125 mg/dL (ref 0.0–149.0)
Total CHOL/HDL Ratio: 3
VLDL: 25 mg/dL (ref 0.0–40.0)

## 2015-01-14 LAB — HEPATIC FUNCTION PANEL
ALK PHOS: 49 U/L (ref 39–117)
ALT: 22 U/L (ref 0–53)
AST: 23 U/L (ref 0–37)
Albumin: 3.9 g/dL (ref 3.5–5.2)
BILIRUBIN DIRECT: 0.2 mg/dL (ref 0.0–0.3)
BILIRUBIN TOTAL: 1.2 mg/dL (ref 0.2–1.2)
Total Protein: 7.1 g/dL (ref 6.0–8.3)

## 2015-01-14 NOTE — Telephone Encounter (Signed)
lmptcb to go over lab result

## 2015-01-14 NOTE — Patient Instructions (Signed)
Medication Instructions:  Your physician recommends that you continue on your current medications as directed. Please refer to the Current Medication list given to you today.  Labwork: No new orders.  Testing/Procedures: No new orders.  Follow-Up: Your physician recommends that you schedule a follow-up appointment in: 4 MONTHS with Richardson Dopp PA-C  Your physician wants you to follow-up in: March 2017 with Dr Burt Knack.  You will receive a reminder letter in the mail two months in advance. If you don't receive a letter, please call our office to schedule the follow-up appointment.   Any Other Special Instructions Will Be Listed Below (If Applicable).

## 2015-01-14 NOTE — Progress Notes (Signed)
Cardiology Office Note Date:  01/14/2015   ID:  Leonard Rodriguez, DOB 1946-01-11, MRN [id number]  PCP:   Melinda Crutch, MD  Cardiologist:  Sherren Mocha, MD    Chief Complaint  Patient presents with  . Follow-up    8 wk, CAD    History of Present Illness: Leonard Rodriguez is a 69 y.o. male who presents for follow-up of coronary artery disease. The patient was hospitalized in March 2016 with a non-ST elevation infarction. He was found to have total occlusion of the left circumflex and was treated with PCI using a drug-eluting stent platform. LVEF was normal by left ventriculography. He had an uneventful post PCI course. He was seen for hospital follow-up on March 11 at which time he was doing well.a follow-up echocardiogram was arranged this demonstrated inferior wall hypokinesis with LVEF estimated at 50%.  The patient is doing very well. States his resting HR at rehab is typically in the mid-50s. BP is in low-normal range. He has no sx's of lightheadedness or syncope. He's doing well with exercise at rehab and is also lifting light weights on his own. He denies symptoms with physical exertion. No dyspnea or chest pain. Tolerating medications without problems.   Past Medical History  Diagnosis Date  . Recurrent upper respiratory infection (URI)     current cold being treated with otc meds.  . Coronary artery disease     a. 11/2014 NSTEMI/PCI: LM nl, LAD min irregs, D1 30-40, LCX 30-46m, 100d (3.5x18 Resolute DES), OM1/2 nl, RCA 50p, R->L collats, EF 55%, mild MR.  [name] Hypertension     put on bp meds. when he was heavier and was not exercising  . Osteoarthritis of right hip   . Hyperlipidemia   . Ischemic cardiomyopathy     Echo 3/16:  EF 45-50%, inf-lat and inf HK, Gr 2 DD, mod MR, mild LAE  . Mitral regurgitation     Past Surgical History  Procedure Laterality Date  . Vasectomy    . Total hip arthroplasty  08/30/2011    Procedure: TOTAL HIP ARTHROPLASTY;  Surgeon: Garald Balding,  MD;  Location: [facility];  Service: Orthopedics;  Laterality: Right;  . Coronary angioplasty with stent placement  11/12/2014    "1"  . Joint replacement    . Tonsillectomy  1950's  . Left heart catheterization with coronary angiogram N/A 11/12/2014    Procedure: LEFT HEART CATHETERIZATION WITH CORONARY ANGIOGRAM;  Surgeon: Blane Ohara, MD;  Location: [facility] CATH LAB;  Service: Cardiovascular;  Laterality: N/A;    Current Outpatient Prescriptions  Medication Sig Dispense Refill  . Ascorbic Acid (VITAMIN C) 1000 MG tablet Take 1,000 mg by mouth daily.    [name] aspirin 81 MG chewable tablet Chew 1 tablet (81 mg total) by mouth daily.    [name] atorvastatin (LIPITOR) 80 MG tablet Take 1 tablet (80 mg total) by mouth daily at 6 PM. 30 tablet 6  . benazepril-hydrochlorthiazide (LOTENSIN HCT) 20-25 MG per tablet Take 1 tablet by mouth daily.    . metoprolol tartrate (LOPRESSOR) 25 MG tablet Take 0.5 tablets (12.5 mg total) by mouth 2 (two) times daily. 30 tablet 6  . Multiple Vitamins-Minerals (MULTIVITAMIN WITH MINERALS) tablet Take 2 tablets by mouth daily.    . nitroGLYCERIN (NITROSTAT) 0.4 MG SL tablet Place 1 tablet (0.4 mg total) under the tongue every 5 (five) minutes x 3 doses as needed for chest pain. 25 tablet 3  . Omega-3 Fatty Acids (FISH OIL)  1200 MG CAPS Take 2,400 mg by mouth daily.    . ticagrelor (BRILINTA) 90 MG TABS tablet Take 1 tablet (90 mg total) by mouth 2 (two) times daily. 60 tablet 6   No current facility-administered medications for this visit.    Allergies:   Review of patient's allergies indicates no known allergies.   Social History:  The patient  reports that he has never smoked. He has never used smokeless tobacco. He reports that he drinks alcohol. He reports that he does not use illicit drugs.   Family History:  The patient's  family history includes Lung cancer in his father. There is no history of Heart attack.    ROS:  Please see the history of present illness. All  other systems are reviewed and negative.   PHYSICAL EXAM: VS:  BP 120/66 mmHg  Pulse 48  Ht 5\' 11"  (1.803 m)  Wt 187 lb 12.8 oz (85.186 kg)  BMI 26.20 kg/m2  SpO2 98% , BMI Body mass index is 26.2 kg/(m^2). GEN: Well nourished, well developed, in no acute distress HEENT: normal Neck: no JVD, no masses. No carotid bruits Cardiac: RRR without murmur or gallop                Respiratory:  clear to auscultation bilaterally, normal work of breathing GI: soft, nontender, nondistended, + BS MS: no deformity or atrophy Ext: no pretibial edema, pedal pulses 2+= bilaterally Skin: warm and dry, no rash Neuro:  Strength and sensation are intact Psych: euthymic mood, full affect  EKG:  EKG is not ordered today.  Recent Labs: 11/12/2014: ALT 39; B Natriuretic Peptide 316.3* 11/13/2014: BUN 13; Creatinine 1.24; Hemoglobin 13.3; Platelets 207; Potassium 4.1; Sodium 134*   Lipid Panel     Component Value Date/Time   CHOL 229* 11/13/2014 0108   TRIG 134 11/13/2014 0108   HDL 43 11/13/2014 0108   CHOLHDL 5.3 11/13/2014 0108   VLDL 27 11/13/2014 0108   LDLCALC 159* 11/13/2014 0108      Wt Readings from Last 3 Encounters:  01/14/15 187 lb 12.8 oz (85.186 kg)  12/04/14 196 lb 10.4 oz (89.2 kg)  11/21/14 198 lb 9.6 oz (90.084 kg)     Cardiac Studies Reviewed: 2-D echocardiogram 11/26/2014: Study Conclusions  - Left ventricle: The cavity size was normal. Systolic function was mildly reduced. The estimated ejection fraction was in the range of 45% to 50%. There is hypokinesis of the basal-midinferolateral and inferior myocardium. Features are consistent with a pseudonormal left ventricular filling pattern, with concomitant abnormal relaxation and increased filling pressure (grade 2 diastolic dysfunction). No evidence of thrombus. - Mitral valve: There was moderate regurgitation. - Left atrium: The atrium was mildly dilated.  Cardiac cath 3.2.16: Final Conclusions:  1.  Single-vessel coronary artery disease with total occlusion of the distal left circumflex, treated successfully with PCI using a drug-eluting stent 2. Diffuse nonobstructive stenosis of the RCA and LAD 3. Mild contraction abnormality left ventricle with hypokinesis of the basal inferior wall and preserved overall LVEF   Recommendations:  Post MI medical therapy. Suspect the patient can be discharged tomorrow as long as no complications occurred.  ASSESSMENT AND PLAN: 1.  CAD, native vessel, with inferolateral MI, no active angina. Now 2 months out from his MI treated with PCI of the LCx. The patient is doing well on his current medical program which includes aspirin and brilinta. This will be continued for a minimum of 12 months. He has an excellent exercise regime. Will  arrange follow-up with Richardson Dopp in 4 months and I will plan on seeing him back when he is one year out from his infarct, March 2017.  2. Hyperlipidemia: He is doing well with diet and exercise. He's on a high intensity statin drug. Blood work was drawn this morning.  3. Hypertension: Blood pressure is under ideal control on benazepril, hydrochlorothiazide, and metoprolol.   Current medicines are reviewed with the patient today.  The patient does not have concerns regarding medicines.  Labs/ tests ordered today include:  No orders of the defined types were placed in this encounter.    Disposition:   FU Richardson Dopp 4 months.  Deatra James, MD  01/14/2015 12:33 PM    [address], [place], [place]  [address] Phone: [phone]; Fax: [phone]

## 2015-01-14 NOTE — Telephone Encounter (Signed)
ptcb and has been notified about lab results with verbal understanding. Pt was very happy about his lab results.

## 2015-01-16 ENCOUNTER — Encounter (HOSPITAL_COMMUNITY)
Admission: RE | Admit: 2015-01-16 | Discharge: 2015-01-16 | Disposition: A | Discharge status: Home / Self Care | Payer: Medicare Other | Source: Ambulatory Visit | Attending: Cardiovascular Disease | Admitting: Cardiovascular Disease

## 2015-01-16 DIAGNOSIS — Z48812 Encounter for surgical aftercare following surgery on the circulatory system: Secondary | ICD-10-CM | POA: Diagnosis not present

## 2015-01-16 DIAGNOSIS — I2510 Atherosclerotic heart disease of native coronary artery without angina pectoris: Secondary | ICD-10-CM | POA: Diagnosis not present

## 2015-01-16 DIAGNOSIS — I252 Old myocardial infarction: Secondary | ICD-10-CM | POA: Diagnosis not present

## 2015-01-16 DIAGNOSIS — Z955 Presence of coronary angioplasty implant and graft: Secondary | ICD-10-CM | POA: Diagnosis not present

## 2015-01-19 ENCOUNTER — Encounter (HOSPITAL_COMMUNITY)
Admission: RE | Admit: 2015-01-19 | Discharge: 2015-01-19 | Disposition: A | Discharge status: Home / Self Care | Payer: Medicare Other | Source: Ambulatory Visit | Attending: Cardiovascular Disease | Admitting: Cardiovascular Disease

## 2015-01-19 DIAGNOSIS — I2510 Atherosclerotic heart disease of native coronary artery without angina pectoris: Secondary | ICD-10-CM | POA: Diagnosis not present

## 2015-01-19 DIAGNOSIS — Z955 Presence of coronary angioplasty implant and graft: Secondary | ICD-10-CM | POA: Diagnosis not present

## 2015-01-19 DIAGNOSIS — I252 Old myocardial infarction: Secondary | ICD-10-CM | POA: Diagnosis not present

## 2015-01-19 DIAGNOSIS — Z48812 Encounter for surgical aftercare following surgery on the circulatory system: Secondary | ICD-10-CM | POA: Diagnosis not present

## 2015-01-21 ENCOUNTER — Encounter (HOSPITAL_COMMUNITY)
Admission: RE | Admit: 2015-01-21 | Discharge: 2015-01-21 | Disposition: A | Discharge status: Home / Self Care | Payer: Medicare Other | Source: Ambulatory Visit | Attending: Cardiovascular Disease | Admitting: Cardiovascular Disease

## 2015-01-21 DIAGNOSIS — I252 Old myocardial infarction: Secondary | ICD-10-CM | POA: Diagnosis not present

## 2015-01-21 DIAGNOSIS — I2510 Atherosclerotic heart disease of native coronary artery without angina pectoris: Secondary | ICD-10-CM | POA: Diagnosis not present

## 2015-01-21 DIAGNOSIS — Z48812 Encounter for surgical aftercare following surgery on the circulatory system: Secondary | ICD-10-CM | POA: Diagnosis not present

## 2015-01-21 DIAGNOSIS — Z955 Presence of coronary angioplasty implant and graft: Secondary | ICD-10-CM | POA: Diagnosis not present

## 2015-01-23 ENCOUNTER — Encounter (HOSPITAL_COMMUNITY)
Admission: RE | Admit: 2015-01-23 | Discharge: 2015-01-23 | Disposition: A | Discharge status: Home / Self Care | Payer: Medicare Other | Source: Ambulatory Visit | Attending: Cardiovascular Disease | Admitting: Cardiovascular Disease

## 2015-01-23 DIAGNOSIS — I2510 Atherosclerotic heart disease of native coronary artery without angina pectoris: Secondary | ICD-10-CM | POA: Diagnosis not present

## 2015-01-23 DIAGNOSIS — I252 Old myocardial infarction: Secondary | ICD-10-CM | POA: Diagnosis not present

## 2015-01-23 DIAGNOSIS — Z955 Presence of coronary angioplasty implant and graft: Secondary | ICD-10-CM | POA: Diagnosis not present

## 2015-01-23 DIAGNOSIS — Z48812 Encounter for surgical aftercare following surgery on the circulatory system: Secondary | ICD-10-CM | POA: Diagnosis not present

## 2015-01-26 ENCOUNTER — Encounter (HOSPITAL_COMMUNITY)
Admission: RE | Admit: 2015-01-26 | Discharge: 2015-01-26 | Disposition: A | Discharge status: Home / Self Care | Payer: Medicare Other | Source: Ambulatory Visit | Attending: Cardiovascular Disease | Admitting: Cardiovascular Disease

## 2015-01-26 DIAGNOSIS — I2510 Atherosclerotic heart disease of native coronary artery without angina pectoris: Secondary | ICD-10-CM | POA: Diagnosis not present

## 2015-01-26 DIAGNOSIS — Z48812 Encounter for surgical aftercare following surgery on the circulatory system: Secondary | ICD-10-CM | POA: Diagnosis not present

## 2015-01-26 DIAGNOSIS — I252 Old myocardial infarction: Secondary | ICD-10-CM | POA: Diagnosis not present

## 2015-01-26 DIAGNOSIS — Z955 Presence of coronary angioplasty implant and graft: Secondary | ICD-10-CM | POA: Diagnosis not present

## 2015-01-28 ENCOUNTER — Encounter (HOSPITAL_COMMUNITY)
Admission: RE | Admit: 2015-01-28 | Discharge: 2015-01-28 | Disposition: A | Discharge status: Home / Self Care | Payer: Medicare Other | Source: Ambulatory Visit | Attending: Cardiovascular Disease | Admitting: Cardiovascular Disease

## 2015-01-28 DIAGNOSIS — I252 Old myocardial infarction: Secondary | ICD-10-CM | POA: Diagnosis not present

## 2015-01-28 DIAGNOSIS — Z48812 Encounter for surgical aftercare following surgery on the circulatory system: Secondary | ICD-10-CM | POA: Diagnosis not present

## 2015-01-28 DIAGNOSIS — I2510 Atherosclerotic heart disease of native coronary artery without angina pectoris: Secondary | ICD-10-CM | POA: Diagnosis not present

## 2015-01-28 DIAGNOSIS — Z955 Presence of coronary angioplasty implant and graft: Secondary | ICD-10-CM | POA: Diagnosis not present

## 2015-01-30 ENCOUNTER — Encounter (HOSPITAL_COMMUNITY)
Admission: RE | Admit: 2015-01-30 | Discharge: 2015-01-30 | Disposition: A | Discharge status: Home / Self Care | Payer: Medicare Other | Source: Ambulatory Visit | Attending: Cardiovascular Disease | Admitting: Cardiovascular Disease

## 2015-01-30 DIAGNOSIS — Z955 Presence of coronary angioplasty implant and graft: Secondary | ICD-10-CM | POA: Diagnosis not present

## 2015-01-30 DIAGNOSIS — Z48812 Encounter for surgical aftercare following surgery on the circulatory system: Secondary | ICD-10-CM | POA: Diagnosis not present

## 2015-01-30 DIAGNOSIS — I2510 Atherosclerotic heart disease of native coronary artery without angina pectoris: Secondary | ICD-10-CM | POA: Diagnosis not present

## 2015-01-30 DIAGNOSIS — I252 Old myocardial infarction: Secondary | ICD-10-CM | POA: Diagnosis not present

## 2015-02-02 ENCOUNTER — Encounter (HOSPITAL_COMMUNITY)
Admission: RE | Admit: 2015-02-02 | Discharge: 2015-02-02 | Disposition: A | Discharge status: Home / Self Care | Payer: Medicare Other | Source: Ambulatory Visit | Attending: Cardiovascular Disease | Admitting: Cardiovascular Disease

## 2015-02-02 DIAGNOSIS — Z48812 Encounter for surgical aftercare following surgery on the circulatory system: Secondary | ICD-10-CM | POA: Diagnosis not present

## 2015-02-02 DIAGNOSIS — Z955 Presence of coronary angioplasty implant and graft: Secondary | ICD-10-CM | POA: Diagnosis not present

## 2015-02-02 DIAGNOSIS — I252 Old myocardial infarction: Secondary | ICD-10-CM | POA: Diagnosis not present

## 2015-02-02 DIAGNOSIS — I2510 Atherosclerotic heart disease of native coronary artery without angina pectoris: Secondary | ICD-10-CM | POA: Diagnosis not present

## 2015-02-04 ENCOUNTER — Encounter (HOSPITAL_COMMUNITY)
Admission: RE | Admit: 2015-02-04 | Discharge: 2015-02-04 | Disposition: A | Discharge status: Home / Self Care | Payer: Medicare Other | Source: Ambulatory Visit | Attending: Cardiovascular Disease | Admitting: Cardiovascular Disease

## 2015-02-04 ENCOUNTER — Telehealth (HOSPITAL_COMMUNITY): Payer: Self-pay | Admitting: *Deleted

## 2015-02-04 DIAGNOSIS — I252 Old myocardial infarction: Secondary | ICD-10-CM | POA: Diagnosis not present

## 2015-02-04 DIAGNOSIS — Z48812 Encounter for surgical aftercare following surgery on the circulatory system: Secondary | ICD-10-CM | POA: Diagnosis not present

## 2015-02-04 DIAGNOSIS — I2510 Atherosclerotic heart disease of native coronary artery without angina pectoris: Secondary | ICD-10-CM | POA: Diagnosis not present

## 2015-02-04 DIAGNOSIS — Z955 Presence of coronary angioplasty implant and graft: Secondary | ICD-10-CM | POA: Diagnosis not present

## 2015-02-04 NOTE — Progress Notes (Signed)
Mr Teems started high intensity training this morning. Patient has frequent PVC's and intermittent couplets which the patient has a history of.. Mr Brier had a 4 beat run of Vtach on the airdyne on level 5.9. Blood pressure 140/62. Patient asymptomatic. I had Mr Degnan to decrease his work load. Luisa Dago PAC called and notified. No new orders received. Patient continued exercise without difficulty. I asked the patient to hold off on any further high intensity training. Will forward today's ECG tracings to Dr Burt Knack office and get his input. Will continue to monitor the patient throughout  the program.

## 2015-02-04 NOTE — Telephone Encounter (Signed)
-----   Message from Sherren Mocha, MD sent at 02/03/2015  5:39 PM EDT ----- Regarding: RE: Change in exercise prescriptions This is fine thank you ----- Message -----    From: Clotilde Dieter    Sent: 02/03/2015   8:38 AM      To: Sherren Mocha, MD Subject: Change in exercise prescriptions               Dr. Burt Knack,  Pt is interested in doing high intensity interval training (HIIT) in Cardiac Rehab.  They have been in program for 8 weeks and have been doing great.  We would like to change their exercise prescription to include HIIT.  Their current THR is 76-122 (50-80%) and we would like to increase it to 144 max (95 %) for HIIT.  Their RPE levels for the HIIT would reach up to 16-17 and active rest would be 11-13.  They would start at 2 min of active rest and 30 sec of high intensity and progress as tolerated. If you are agreeable to this change in exercise prescription please let us know. Thanks so much for your help!  Alberteen Sam, MA, ACSM RCEP

## 2015-02-06 ENCOUNTER — Encounter (HOSPITAL_COMMUNITY)
Admission: RE | Admit: 2015-02-06 | Discharge: 2015-02-06 | Disposition: A | Discharge status: Home / Self Care | Payer: Medicare Other | Source: Ambulatory Visit | Attending: Cardiovascular Disease | Admitting: Cardiovascular Disease

## 2015-02-06 DIAGNOSIS — I252 Old myocardial infarction: Secondary | ICD-10-CM | POA: Diagnosis not present

## 2015-02-06 DIAGNOSIS — I2510 Atherosclerotic heart disease of native coronary artery without angina pectoris: Secondary | ICD-10-CM | POA: Diagnosis not present

## 2015-02-06 DIAGNOSIS — Z955 Presence of coronary angioplasty implant and graft: Secondary | ICD-10-CM | POA: Diagnosis not present

## 2015-02-06 DIAGNOSIS — Z48812 Encounter for surgical aftercare following surgery on the circulatory system: Secondary | ICD-10-CM | POA: Diagnosis not present

## 2015-02-11 ENCOUNTER — Encounter (HOSPITAL_COMMUNITY)
Admission: RE | Admit: 2015-02-11 | Discharge: 2015-02-11 | Disposition: A | Discharge status: Home / Self Care | Payer: Medicare Other | Source: Ambulatory Visit | Attending: Cardiovascular Disease | Admitting: Cardiovascular Disease

## 2015-02-11 DIAGNOSIS — I2510 Atherosclerotic heart disease of native coronary artery without angina pectoris: Secondary | ICD-10-CM | POA: Insufficient documentation

## 2015-02-11 DIAGNOSIS — Z48812 Encounter for surgical aftercare following surgery on the circulatory system: Secondary | ICD-10-CM | POA: Diagnosis not present

## 2015-02-11 DIAGNOSIS — Z955 Presence of coronary angioplasty implant and graft: Secondary | ICD-10-CM | POA: Diagnosis not present

## 2015-02-11 DIAGNOSIS — I252 Old myocardial infarction: Secondary | ICD-10-CM | POA: Insufficient documentation

## 2015-02-13 ENCOUNTER — Encounter (HOSPITAL_COMMUNITY)
Admission: RE | Admit: 2015-02-13 | Discharge: 2015-02-13 | Disposition: A | Discharge status: Home / Self Care | Payer: Medicare Other | Source: Ambulatory Visit | Attending: Cardiovascular Disease | Admitting: Cardiovascular Disease

## 2015-02-13 DIAGNOSIS — Z955 Presence of coronary angioplasty implant and graft: Secondary | ICD-10-CM | POA: Diagnosis not present

## 2015-02-13 DIAGNOSIS — I2510 Atherosclerotic heart disease of native coronary artery without angina pectoris: Secondary | ICD-10-CM | POA: Diagnosis not present

## 2015-02-13 DIAGNOSIS — I252 Old myocardial infarction: Secondary | ICD-10-CM | POA: Diagnosis not present

## 2015-02-13 DIAGNOSIS — Z48812 Encounter for surgical aftercare following surgery on the circulatory system: Secondary | ICD-10-CM | POA: Diagnosis not present

## 2015-02-16 ENCOUNTER — Encounter (HOSPITAL_COMMUNITY)
Admission: RE | Admit: 2015-02-16 | Discharge: 2015-02-16 | Disposition: A | Discharge status: Home / Self Care | Payer: Medicare Other | Source: Ambulatory Visit | Attending: Cardiovascular Disease | Admitting: Cardiovascular Disease

## 2015-02-16 DIAGNOSIS — Z48812 Encounter for surgical aftercare following surgery on the circulatory system: Secondary | ICD-10-CM | POA: Diagnosis not present

## 2015-02-16 DIAGNOSIS — I2510 Atherosclerotic heart disease of native coronary artery without angina pectoris: Secondary | ICD-10-CM | POA: Diagnosis not present

## 2015-02-16 DIAGNOSIS — Z955 Presence of coronary angioplasty implant and graft: Secondary | ICD-10-CM | POA: Diagnosis not present

## 2015-02-16 DIAGNOSIS — I252 Old myocardial infarction: Secondary | ICD-10-CM | POA: Diagnosis not present

## 2015-02-18 ENCOUNTER — Encounter (HOSPITAL_COMMUNITY)
Admission: RE | Admit: 2015-02-18 | Discharge: 2015-02-18 | Disposition: A | Discharge status: Home / Self Care | Payer: Medicare Other | Source: Ambulatory Visit | Attending: Cardiovascular Disease | Admitting: Cardiovascular Disease

## 2015-02-18 DIAGNOSIS — Z955 Presence of coronary angioplasty implant and graft: Secondary | ICD-10-CM | POA: Diagnosis not present

## 2015-02-18 DIAGNOSIS — I252 Old myocardial infarction: Secondary | ICD-10-CM | POA: Diagnosis not present

## 2015-02-18 DIAGNOSIS — I2510 Atherosclerotic heart disease of native coronary artery without angina pectoris: Secondary | ICD-10-CM | POA: Diagnosis not present

## 2015-02-18 DIAGNOSIS — Z48812 Encounter for surgical aftercare following surgery on the circulatory system: Secondary | ICD-10-CM | POA: Diagnosis not present

## 2015-02-20 ENCOUNTER — Encounter (HOSPITAL_COMMUNITY)
Admission: RE | Admit: 2015-02-20 | Discharge: 2015-02-20 | Disposition: A | Discharge status: Home / Self Care | Payer: Medicare Other | Source: Ambulatory Visit | Attending: Cardiovascular Disease | Admitting: Cardiovascular Disease

## 2015-02-20 DIAGNOSIS — Z955 Presence of coronary angioplasty implant and graft: Secondary | ICD-10-CM | POA: Diagnosis not present

## 2015-02-20 DIAGNOSIS — I252 Old myocardial infarction: Secondary | ICD-10-CM | POA: Diagnosis not present

## 2015-02-20 DIAGNOSIS — Z48812 Encounter for surgical aftercare following surgery on the circulatory system: Secondary | ICD-10-CM | POA: Diagnosis not present

## 2015-02-20 DIAGNOSIS — I2510 Atherosclerotic heart disease of native coronary artery without angina pectoris: Secondary | ICD-10-CM | POA: Diagnosis not present

## 2015-02-23 ENCOUNTER — Encounter (HOSPITAL_COMMUNITY)
Admission: RE | Admit: 2015-02-23 | Discharge: 2015-02-23 | Disposition: A | Discharge status: Home / Self Care | Payer: Medicare Other | Source: Ambulatory Visit | Attending: Cardiovascular Disease | Admitting: Cardiovascular Disease

## 2015-02-23 DIAGNOSIS — I2510 Atherosclerotic heart disease of native coronary artery without angina pectoris: Secondary | ICD-10-CM | POA: Diagnosis not present

## 2015-02-23 DIAGNOSIS — Z48812 Encounter for surgical aftercare following surgery on the circulatory system: Secondary | ICD-10-CM | POA: Diagnosis not present

## 2015-02-23 DIAGNOSIS — Z955 Presence of coronary angioplasty implant and graft: Secondary | ICD-10-CM | POA: Diagnosis not present

## 2015-02-23 DIAGNOSIS — I252 Old myocardial infarction: Secondary | ICD-10-CM | POA: Diagnosis not present

## 2015-02-25 ENCOUNTER — Encounter (HOSPITAL_COMMUNITY)
Admission: RE | Admit: 2015-02-25 | Discharge: 2015-02-25 | Disposition: A | Discharge status: Home / Self Care | Payer: Medicare Other | Source: Ambulatory Visit | Attending: Cardiovascular Disease | Admitting: Cardiovascular Disease

## 2015-02-25 DIAGNOSIS — I2510 Atherosclerotic heart disease of native coronary artery without angina pectoris: Secondary | ICD-10-CM | POA: Diagnosis not present

## 2015-02-25 DIAGNOSIS — I252 Old myocardial infarction: Secondary | ICD-10-CM | POA: Diagnosis not present

## 2015-02-25 DIAGNOSIS — Z955 Presence of coronary angioplasty implant and graft: Secondary | ICD-10-CM | POA: Diagnosis not present

## 2015-02-25 DIAGNOSIS — Z48812 Encounter for surgical aftercare following surgery on the circulatory system: Secondary | ICD-10-CM | POA: Diagnosis not present

## 2015-02-27 ENCOUNTER — Encounter (HOSPITAL_COMMUNITY)
Admission: RE | Admit: 2015-02-27 | Discharge: 2015-02-27 | Disposition: A | Discharge status: Home / Self Care | Payer: Medicare Other | Source: Ambulatory Visit | Attending: Cardiovascular Disease | Admitting: Cardiovascular Disease

## 2015-02-27 DIAGNOSIS — Z955 Presence of coronary angioplasty implant and graft: Secondary | ICD-10-CM | POA: Diagnosis not present

## 2015-02-27 DIAGNOSIS — I252 Old myocardial infarction: Secondary | ICD-10-CM | POA: Diagnosis not present

## 2015-02-27 DIAGNOSIS — I2510 Atherosclerotic heart disease of native coronary artery without angina pectoris: Secondary | ICD-10-CM | POA: Diagnosis not present

## 2015-02-27 DIAGNOSIS — Z48812 Encounter for surgical aftercare following surgery on the circulatory system: Secondary | ICD-10-CM | POA: Diagnosis not present

## 2015-03-02 ENCOUNTER — Encounter (HOSPITAL_COMMUNITY)
Admission: RE | Admit: 2015-03-02 | Discharge: 2015-03-02 | Disposition: A | Discharge status: Home / Self Care | Payer: Medicare Other | Source: Ambulatory Visit | Attending: Cardiovascular Disease | Admitting: Cardiovascular Disease

## 2015-03-02 DIAGNOSIS — Z955 Presence of coronary angioplasty implant and graft: Secondary | ICD-10-CM | POA: Diagnosis not present

## 2015-03-02 DIAGNOSIS — I2510 Atherosclerotic heart disease of native coronary artery without angina pectoris: Secondary | ICD-10-CM | POA: Diagnosis not present

## 2015-03-02 DIAGNOSIS — I252 Old myocardial infarction: Secondary | ICD-10-CM | POA: Diagnosis not present

## 2015-03-02 DIAGNOSIS — Z48812 Encounter for surgical aftercare following surgery on the circulatory system: Secondary | ICD-10-CM | POA: Diagnosis not present

## 2015-03-02 NOTE — Progress Notes (Signed)
Pt graduated from cardiac rehab program today with completion of 36 exercise sessions in Phase II. Pt maintained good attendance and progressed nicely during his participation in rehab as evidenced by increased MET level.   Medication list reconciled. Repeat  PHQ score-0  .  Pt has made significant lifestyle changes and should be commended for his success. Pt feels he has achieved his goals during cardiac rehab.   Pt plans to continue exercise at the gym.

## 2015-03-04 ENCOUNTER — Encounter (HOSPITAL_COMMUNITY): Payer: Medicare Other

## 2015-03-06 ENCOUNTER — Encounter (HOSPITAL_COMMUNITY): Payer: Medicare Other

## 2015-03-09 ENCOUNTER — Encounter (HOSPITAL_COMMUNITY): Payer: Medicare Other

## 2015-03-11 ENCOUNTER — Encounter (HOSPITAL_COMMUNITY): Payer: Medicare Other

## 2015-03-13 ENCOUNTER — Encounter (HOSPITAL_COMMUNITY): Payer: Medicare Other

## 2015-04-07 ENCOUNTER — Encounter: Payer: Self-pay | Admitting: Physician Assistant

## 2015-04-08 ENCOUNTER — Encounter: Payer: Self-pay | Admitting: Physician Assistant

## 2015-05-19 ENCOUNTER — Ambulatory Visit: Payer: Medicare Other | Admitting: Physician Assistant

## 2015-05-19 NOTE — Progress Notes (Signed)
Cardiology Office Note   Date:  05/20/2015   ID:  Leonard Rodriguez, DOB 1946/06/04, MRN [id number]  PCP:   Melinda Crutch, MD  Cardiologist:  Dr. Sherren Mocha     Chief Complaint  Patient presents with  . Coronary Artery Disease     History of Present Illness: Leonard Rodriguez is a 69 y.o. male with a hx of CAD s/p NSTEMI 3/16.  LHC demonstrated an occluded CFX that was treated with a DES.  EF was preserved.  Returns for FU.  Here alone. He is excited that [place] opened their football season with a huge win.  He works out daily and is doing well.  He walks 8 miles a day on his treadmill and lifts weights 3 days a week.  The patient denies chest pain, shortness of breath, syncope, orthopnea, PND or significant pedal edema.    Studies/Reports Reviewed Today:  Echo 3/16 EF 45-50%, inf-lateral and inf HK, Gr 2 DD, mod MR, mild LAE  Cardiac Cath/PCI 11/12/14 LM:  patent with no obstructive disease.  LAD: D1 30-40% stenosis.  LCx: OM1 and OM2 mild ostial stenoses of about 30-40%. Distal AVCFX totally occluded  RCA: Prox 50%.  Faint collateral to the left circumflex present. LVEF 55%.  PCI: 3.5 x 18 mm resolute DES. to distal LCx   Past Medical History  Diagnosis Date  . Recurrent upper respiratory infection (URI)     current cold being treated with otc meds.  . Coronary artery disease     a. 11/2014 NSTEMI/PCI: LM nl, LAD min irregs, D1 30-40, LCX 30-83m, 100d (3.5x18 Resolute DES), OM1/2 nl, RCA 50p, R->L collats, EF 55%, mild MR.  [name] Hypertension     put on bp meds. when he was heavier and was not exercising  . Osteoarthritis of right hip   . Hyperlipidemia   . Ischemic cardiomyopathy     Echo 3/16:  EF 45-50%, inf-lat and inf HK, Gr 2 DD, mod MR, mild LAE  . Mitral regurgitation     Past Surgical History  Procedure Laterality Date  . Vasectomy    . Total hip arthroplasty  08/30/2011    Procedure: TOTAL HIP ARTHROPLASTY;  Surgeon: Garald Balding, MD;  Location: [facility];   Service: Orthopedics;  Laterality: Right;  . Coronary angioplasty with stent placement  11/12/2014    "1"  . Joint replacement    . Tonsillectomy  1950's  . Left heart catheterization with coronary angiogram N/A 11/12/2014    Procedure: LEFT HEART CATHETERIZATION WITH CORONARY ANGIOGRAM;  Surgeon: Blane Ohara, MD;  Location: [facility] CATH LAB;  Service: Cardiovascular;  Laterality: N/A;     Current Outpatient Prescriptions  Medication Sig Dispense Refill  . Ascorbic Acid (VITAMIN C) 1000 MG tablet Take 1,000 mg by mouth daily.    [name] aspirin 81 MG chewable tablet Chew 1 tablet (81 mg total) by mouth daily.    [name] atorvastatin (LIPITOR) 80 MG tablet Take 1 tablet (80 mg total) by mouth daily at 6 PM. 30 tablet 6  . benazepril-hydrochlorthiazide (LOTENSIN HCT) 20-25 MG per tablet Take 1 tablet by mouth daily.    . metoprolol tartrate (LOPRESSOR) 25 MG tablet Take 0.5 tablets (12.5 mg total) by mouth 2 (two) times daily. 30 tablet 6  . Multiple Vitamins-Minerals (MULTIVITAMIN WITH MINERALS) tablet Take 2 tablets by mouth daily.    . nitroGLYCERIN (NITROSTAT) 0.4 MG SL tablet Place 1 tablet (0.4 mg total) under the tongue every 5 (  five) minutes x 3 doses as needed for chest pain. 25 tablet 3  . Omega-3 Fatty Acids (FISH OIL) 1200 MG CAPS Take 2,400 mg by mouth daily.    . ticagrelor (BRILINTA) 90 MG TABS tablet Take 1 tablet (90 mg total) by mouth 2 (two) times daily. 60 tablet 6   No current facility-administered medications for this visit.    Allergies:   Review of patient's allergies indicates no known allergies.    Social History:  The patient  reports that he has never smoked. He has never used smokeless tobacco. He reports that he drinks alcohol. He reports that he does not use illicit drugs.   Family History:  The patient's family history includes Lung cancer in his father. There is no history of Heart attack.    ROS:   Please see the history of present illness.   Review of Systems    All other systems reviewed and are negative.    PHYSICAL EXAM: VS:  BP 120/62 mmHg  Pulse 45  Ht 5\' 11"  (1.803 m)  Wt 180 lb 1.9 oz (81.702 kg)  BMI 25.13 kg/m2  SpO2 97%    Wt Readings from Last 3 Encounters:  05/20/15 180 lb 1.9 oz (81.702 kg)  01/14/15 187 lb 12.8 oz (85.186 kg)  12/04/14 196 lb 10.4 oz (89.2 kg)     GEN: Well nourished, well developed, in no acute distress HEENT: normal Neck: no JVD, no carotid bruits. no masses Cardiac:  Normal S1/S2, RRR; no murmur ,  no rubs or gallops, no edema   Respiratory:  clear to auscultation bilaterally, no wheezing, rhonchi or rales. GI: soft, nontender, nondistended, + BS MS: no deformity or atrophy Skin: warm and dry  Neuro:  CNs II-XII intact, Strength and sensation are intact Psych: Normal affect   EKG:  EKG is ordered today.  It demonstrates:   Sinus brady, HR 45, normal axis, TWI in 2, 3, aVF, no change from prior tracing   Recent Labs: 11/12/2014: B Natriuretic Peptide 316.3* 11/13/2014: BUN 13; Creatinine, Ser 1.24; Hemoglobin 13.3; Platelets 207; Potassium 4.1; Sodium 134* 01/14/2015: ALT 22    Lipid Panel    Component Value Date/Time   CHOL 105 01/14/2015 0922   TRIG 125.0 01/14/2015 0922   HDL 37.80* 01/14/2015 0922   CHOLHDL 3 01/14/2015 0922   VLDL 25.0 01/14/2015 0922   LDLCALC 42 01/14/2015 0922      ASSESSMENT AND PLAN:  Coronary artery disease:  Patient is s/p NSTEMI tx with DES to CFX.  Continue ASA, Brilinta, beta-blocker, ACE inhibitor, statin. He is doing well and remains very active without anginal symptoms.   Ischemic CM:  EF 45-50% with inf-lat and inf HK by Echo 3/16.  Continue beta-blocker, ACE inhibitor.  No evidence of volume excess.  He is NYHA 1-2.   Essential hypertension:  Controlled.    Hyperlipidemia:  Recent LDL optimal.  Continue statin.        Medication adjustments Current medicines are reviewed at length with the patient today.  Concerns regarding medications are  outlined above.  The following changes have been made:   Discontinued Medications   No medications on file   Modified Medications   No medications on file   New Prescriptions   No medications on file    Labs/ tests ordered today include:  Orders Placed This Encounter  Procedures  . EKG 12-Lead     Disposition: FU with Dr. Sherren Mocha 3/17.   Signed, Richardson Dopp,  PA-C, MHS 05/20/2015 9:17 AM    [facility] [address], [place], [place]  [address] Phone: [phone]; Fax: [phone]

## 2015-05-20 ENCOUNTER — Ambulatory Visit (INDEPENDENT_AMBULATORY_CARE_PROVIDER_SITE_OTHER): Payer: Medicare Other | Admitting: Physician Assistant

## 2015-05-20 ENCOUNTER — Encounter: Payer: Self-pay | Admitting: Physician Assistant

## 2015-05-20 VITALS — BP 120/62 | HR 45 | Ht 71.0 in | Wt 180.1 lb

## 2015-05-20 DIAGNOSIS — I255 Ischemic cardiomyopathy: Secondary | ICD-10-CM

## 2015-05-20 DIAGNOSIS — I10 Essential (primary) hypertension: Secondary | ICD-10-CM

## 2015-05-20 DIAGNOSIS — I2510 Atherosclerotic heart disease of native coronary artery without angina pectoris: Secondary | ICD-10-CM | POA: Diagnosis not present

## 2015-05-20 DIAGNOSIS — E785 Hyperlipidemia, unspecified: Secondary | ICD-10-CM

## 2015-05-20 NOTE — Patient Instructions (Signed)
Medication Instructions:  Your physician recommends that you continue on your current medications as directed. Please refer to the Current Medication list given to you today.   Labwork: NONE  Testing/Procedures: NONE  Follow-Up: 11/2015 WITH DR. Burt Knack; WE WILL SEND OUT A REMINDER LETTER TO MAKE YOUR APPT A COUPLE MONTHS IN ADVANCED  Any Other Special Instructions Will Be Listed Below (If Applicable).

## 2015-06-13 ENCOUNTER — Other Ambulatory Visit: Payer: Self-pay | Admitting: Nurse Practitioner

## 2015-06-22 ENCOUNTER — Other Ambulatory Visit: Payer: Self-pay | Admitting: Nurse Practitioner

## 2015-06-23 ENCOUNTER — Other Ambulatory Visit: Payer: Self-pay | Admitting: Nurse Practitioner

## 2015-12-01 DIAGNOSIS — E7800 Pure hypercholesterolemia, unspecified: Secondary | ICD-10-CM | POA: Diagnosis not present

## 2015-12-01 DIAGNOSIS — Z125 Encounter for screening for malignant neoplasm of prostate: Secondary | ICD-10-CM | POA: Diagnosis not present

## 2015-12-01 DIAGNOSIS — I10 Essential (primary) hypertension: Secondary | ICD-10-CM | POA: Diagnosis not present

## 2015-12-01 DIAGNOSIS — Z0000 Encounter for general adult medical examination without abnormal findings: Secondary | ICD-10-CM | POA: Diagnosis not present

## 2015-12-02 ENCOUNTER — Encounter: Payer: Self-pay | Admitting: Cardiovascular Disease

## 2015-12-02 DIAGNOSIS — E7800 Pure hypercholesterolemia, unspecified: Secondary | ICD-10-CM | POA: Diagnosis not present

## 2015-12-02 DIAGNOSIS — Z0000 Encounter for general adult medical examination without abnormal findings: Secondary | ICD-10-CM | POA: Diagnosis not present

## 2015-12-02 DIAGNOSIS — Z125 Encounter for screening for malignant neoplasm of prostate: Secondary | ICD-10-CM | POA: Diagnosis not present

## 2015-12-02 DIAGNOSIS — I10 Essential (primary) hypertension: Secondary | ICD-10-CM | POA: Diagnosis not present

## 2015-12-18 ENCOUNTER — Ambulatory Visit (INDEPENDENT_AMBULATORY_CARE_PROVIDER_SITE_OTHER): Payer: Medicare Other | Admitting: Cardiovascular Disease

## 2015-12-18 ENCOUNTER — Encounter: Payer: Self-pay | Admitting: Cardiovascular Disease

## 2015-12-18 VITALS — BP 134/70 | HR 56 | Ht 70.5 in | Wt 187.8 lb

## 2015-12-18 DIAGNOSIS — I2510 Atherosclerotic heart disease of native coronary artery without angina pectoris: Secondary | ICD-10-CM | POA: Diagnosis not present

## 2015-12-18 NOTE — Progress Notes (Signed)
Cardiology Office Note Date:  12/18/2015   ID:  Leonard Rodriguez, DOB [birth year], MRN [id number]  PCP:   Melinda Crutch, MD  Cardiologist:  Sherren Mocha, MD    Chief Complaint  Patient presents with  . Coronary Artery Disease    denies cp, sob, le edema, claudication  . Hypertension     History of Present Illness: Leonard Rodriguez is a 70 y.o. male who presents for follow-up of coronary artery disease.he initially presented one year ago with a non-ST elevation infarction. At cardiac catheterization he was found to have total occlusion of the left circumflex treated with a drug-eluting stent. He had no other obstructive disease identified. LVEF was mildly reduced. He's been treated medically since that time and has done well. He exercises 6 days per week with no exertional symptoms.Today, he denies symptoms of palpitations, chest pain, shortness of breath, orthopnea, PND, lower extremity edema, dizziness, or syncope.  Past Medical History  Diagnosis Date  . Recurrent upper respiratory infection (URI)     current cold being treated with otc meds.  . Coronary artery disease     a. 11/2014 NSTEMI/PCI: LM nl, LAD min irregs, D1 30-40, LCX 30-43m, 100d (3.5x18 Resolute DES), OM1/2 nl, RCA 50p, R->L collats, EF 55%, mild MR.  [name] Hypertension     put on bp meds. when he was heavier and was not exercising  . Osteoarthritis of right hip   . Hyperlipidemia   . Ischemic cardiomyopathy     Echo 3/16:  EF 45-50%, inf-lat and inf HK, Gr 2 DD, mod MR, mild LAE  . Mitral regurgitation     Past Surgical History  Procedure Laterality Date  . Vasectomy    . Total hip arthroplasty  08/30/2011    Procedure: TOTAL HIP ARTHROPLASTY;  Surgeon: Garald Balding, MD;  Location: [facility];  Service: Orthopedics;  Laterality: Right;  . Coronary angioplasty with stent placement  11/12/2014    "1"  . Joint replacement    . Tonsillectomy  1950's  . Left heart catheterization with coronary angiogram N/A 11/12/2014   Procedure: LEFT HEART CATHETERIZATION WITH CORONARY ANGIOGRAM;  Surgeon: Blane Ohara, MD;  Location: [facility] CATH LAB;  Service: Cardiovascular;  Laterality: N/A;    Current Outpatient Prescriptions  Medication Sig Dispense Refill  . Ascorbic Acid (VITAMIN C) 1000 MG tablet Take 1,000 mg by mouth daily.    [name] aspirin 81 MG chewable tablet Chew 1 tablet (81 mg total) by mouth daily.    [name] atorvastatin (LIPITOR) 80 MG tablet TAKE ONE TABLET BY MOUTH ONCE DAILY AT 6:00 IN THE EVENING 30 tablet 6  . benazepril-hydrochlorthiazide (LOTENSIN HCT) 20-25 MG per tablet Take 1 tablet by mouth daily.    [name] BRILINTA 90 MG TABS tablet TAKE ONE TABLET BY MOUTH TWO TIMES A DAY 60 tablet 6  . metoprolol tartrate (LOPRESSOR) 25 MG tablet TAKE 1/2 TABLET BY MOUTH TWO TIMES A DAY 30 tablet 6  . Multiple Vitamins-Minerals (MULTIVITAMIN WITH MINERALS) tablet Take 2 tablets by mouth daily.    . nitroGLYCERIN (NITROSTAT) 0.4 MG SL tablet Place 1 tablet (0.4 mg total) under the tongue every 5 (five) minutes x 3 doses as needed for chest pain. 25 tablet 3  . Omega-3 Fatty Acids (FISH OIL) 1200 MG CAPS Take 2,400 mg by mouth daily.     No current facility-administered medications for this visit.    Allergies:   Review of patient's allergies indicates no known allergies.   Social  History:  The patient  reports that he has never smoked. He has never used smokeless tobacco. He reports that he drinks alcohol. He reports that he does not use illicit drugs.   Family History:  The patient's  family history includes Lung cancer in his father. There is no history of Heart attack.    ROS:  Please see the history of present illness.  All other systems are reviewed and negative.    PHYSICAL EXAM: VS:  BP 134/70 mmHg  Pulse 56  Ht 5' 10.5" (1.791 m)  Wt 187 lb 12.8 oz (85.186 kg)  BMI 26.56 kg/m2 , BMI Body mass index is 26.56 kg/(m^2). GEN: Well nourished, well developed, in no acute distress HEENT: normal Neck: no JVD,  no masses. No carotid bruits Cardiac: RRR without murmur or gallop                Respiratory:  clear to auscultation bilaterally, normal work of breathing GI: soft, nontender, nondistended, + BS MS: no deformity or atrophy Ext: no pretibial edema, pedal pulses 2+= bilaterally Skin: warm and dry, no rash Neuro:  Strength and sensation are intact Psych: euthymic mood, full affect  EKG:  EKG is ordered today. The ekg ordered today shows Sinus bradycardia 53 bpm, age indeterminate inferior infarct.  Recent Labs: 01/14/2015: ALT 22   Lipid Panel     Component Value Date/Time   CHOL 105 01/14/2015 0922   TRIG 125.0 01/14/2015 0922   HDL 37.80* 01/14/2015 0922   CHOLHDL 3 01/14/2015 0922   VLDL 25.0 01/14/2015 0922   LDLCALC 42 01/14/2015 0922      Wt Readings from Last 3 Encounters:  12/18/15 187 lb 12.8 oz (85.186 kg)  05/20/15 180 lb 1.9 oz (81.702 kg)  01/14/15 187 lb 12.8 oz (85.186 kg)     Cardiac Studies Reviewed: Cardiac Cath 11/12/2014: PROCEDURAL FINDINGS Hemodynamics: AO 142/64 LV 139/14  Coronary angiography: Coronary dominance: right  Left mainstem: The left main is patent with no obstructive disease. The vessel divides into the LAD and left circumflex.  Left anterior descending (LAD): The LAD has diffuse irregularity without significant stenosis. Proximal LAD is widely patent. The mid LAD after the first diagonal has diffuse irregularities. The first diagonal has 30-40% stenosis. The mid and distal LAD are widely patent without stenosis.  Left circumflex (LCx): The left circumflex is patent proximally. The first obtuse marginal branches 1 and 2 are patent. There are mild ostial stenoses of about 30-40%. The distal AV circumflex beyond the second obtuse marginal branch is totally occluded with faint filling of the third OM branch.  Right coronary artery (RCA): The RCA has a high, anterior origin. The proximal vessel has 50% stenosis. The mid vessel is  patent. The acute marginal branch is patent. There is a small PDA branch with no significant stenosis. There is a faint collateral to the left circumflex present.  Left ventriculography: The basal inferior wall is severely hypokinetic. The other LV wall segments contract normally. The LVEF is estimated at 55%. There is mild mitral regurgitation present.  PCI Note: Following the diagnostic procedure, the decision was made to proceed with PCI of the left circumflex. The vessel is totally occluded with angiographic findings consistent with an acute occlusion. The patient was loaded with brilinta 180 mg on the table. Weight-based bivalirudin was given for anticoagulation. Once a therapeutic ACT was achieved, a 6 [place] XB 3.5 cm guide catheter was inserted. A cougar guidewire was initially attempted but could not  be advanced across the lesion. A whisper coronary guidewire was used to cross the lesion. The lesion was predilated with a 2.5 mm balloon. The lesion was then stented with a 3.5 x 18 mm resolute DES. After stenting there was mild no-reflow. Intracoronary verapamil was administered. The stent appear well expanded. The flow returned to normal. I felt that postdilatation would likely exacerbate no-reflow. There was a distal occlusion of the AV groove circumflex and a separate branch. I redirected the wire across that occlusion. The balloon was advanced and dilated. However, flow was not restored. The vessel appeared very small and I did not think that further efforts were worthwhile. Following PCI, there was 0% residual stenosis and TIMI-3 flow. Final angiography confirmed an excellent result. The patient tolerated the procedure well. There were no immediate procedural complications. A TR band was used for radial hemostasis. The patient was transferred to the post catheterization recovery area for further monitoring.  PCI Data: Vessel - LCx/Segment - distal Percent Stenosis (pre) 100 TIMI-flow  0 Stent 3.5x18 mm DES Percent Stenosis (post) 0 TIMI-flow (post) 3  Estimated Blood Loss: minimal  Final Conclusions:  1. Single-vessel coronary artery disease with total occlusion of the distal left circumflex, treated successfully with PCI using a drug-eluting stent 2. Diffuse nonobstructive stenosis of the RCA and LAD 3. Mild contraction abnormality left ventricle with hypokinesis of the basal inferior wall and preserved overall LVEF   Recommendations:  Post MI medical therapy. Suspect the patient can be discharged tomorrow as long as no complications occurred.  ASSESSMENT AND PLAN: 1.  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 He's 12 months out from his MI and I have recommended stopping Brilinta today. He understands the importance of continuing on aspirin 81 mg daily. Other medicines will be continued without changes. I will see him back in one year.  2. Hyperlipidemia: His recent lipids are excellent. These are reviewed today. Over the past year his cholesterol has gone from 229 down to 105 mg/dL. His LDL is gone from 159-42 mg/dL. He will continue on atorvastatin 80 mg. His lipids are followed by his primary care physician.  3. Essential hypertension: Blood pressure is well controlled on benazepril/hydrochlorothiazide.  Current medicines are reviewed with the patient today.  The patient does not have concerns regarding medicines.  Labs/ tests ordered today include:  No orders of the defined types were placed in this encounter.    Disposition:   FU one year  Signed, Sherren Mocha, MD  12/18/2015 4:26 PM    [facility] [address], [place], [place]  [address] Phone: [phone]; Fax: [phone]

## 2015-12-18 NOTE — Patient Instructions (Signed)
Medication Instructions:  Your physician has recommended you make the following change in your medication:  1. STOP Brilinta  Labwork: No new orders.   Testing/Procedures: No new orders.   Follow-Up: Your physician wants you to follow-up in: 1 YEAR with Dr Burt Knack.  You will receive a reminder letter in the mail two months in advance. If you don't receive a letter, please call our office to schedule the follow-up appointment.   Any Other Special Instructions Will Be Listed Below (If Applicable).     If you need a refill on your cardiac medications before your next appointment, please call your pharmacy.

## 2015-12-31 ENCOUNTER — Other Ambulatory Visit: Payer: Self-pay | Admitting: Nurse Practitioner

## 2016-01-14 ENCOUNTER — Other Ambulatory Visit: Payer: Self-pay | Admitting: Cardiovascular Disease

## 2016-12-15 ENCOUNTER — Encounter: Payer: Self-pay | Admitting: Cardiovascular Disease

## 2016-12-26 DIAGNOSIS — Z125 Encounter for screening for malignant neoplasm of prostate: Secondary | ICD-10-CM | POA: Diagnosis not present

## 2016-12-26 DIAGNOSIS — E7800 Pure hypercholesterolemia, unspecified: Secondary | ICD-10-CM | POA: Diagnosis not present

## 2016-12-26 DIAGNOSIS — Z0000 Encounter for general adult medical examination without abnormal findings: Secondary | ICD-10-CM | POA: Diagnosis not present

## 2016-12-26 DIAGNOSIS — I10 Essential (primary) hypertension: Secondary | ICD-10-CM | POA: Diagnosis not present

## 2016-12-30 ENCOUNTER — Encounter: Payer: Self-pay | Admitting: Cardiovascular Disease

## 2016-12-30 ENCOUNTER — Ambulatory Visit (INDEPENDENT_AMBULATORY_CARE_PROVIDER_SITE_OTHER): Payer: Medicare Other | Admitting: Cardiovascular Disease

## 2016-12-30 VITALS — BP 124/72 | HR 50 | Ht 70.0 in | Wt 197.6 lb

## 2016-12-30 DIAGNOSIS — I10 Essential (primary) hypertension: Secondary | ICD-10-CM

## 2016-12-30 DIAGNOSIS — E785 Hyperlipidemia, unspecified: Secondary | ICD-10-CM | POA: Diagnosis not present

## 2016-12-30 DIAGNOSIS — I2510 Atherosclerotic heart disease of native coronary artery without angina pectoris: Secondary | ICD-10-CM | POA: Diagnosis not present

## 2016-12-30 NOTE — Patient Instructions (Signed)

## 2016-12-30 NOTE — Progress Notes (Signed)
Cardiology Office Note Date:  12/30/2016   ID:  Leonard Rodriguez, DOB [birth year], MRN [id number]  PCP:  Melinda Crutch, MD  Cardiologist:  Sherren Mocha, MD    Chief Complaint  Patient presents with  . Follow-up     History of Present Illness: Leonard Rodriguez is a 71 y.o. male who presents for Follow-up of coronary artery disease. The patient initially presented with a non-ST elevation MI in 2016. He was found to have total occlusion left circumflex treated with a drug-eluting stent. No other significant coronary disease was identified. LV function was mildly reduced with an LVEF of 55%.  The patient is here alone today. He is doing very well. He denies any chest pain, chest pressure, or shortness of breath. He is physically active with regular exercise. He is able to bench press 250 pounds on a weight machine several times. States that he stronger than he's ever been.   Past Medical History:  Diagnosis Date  . Coronary artery disease    a. 11/2014 NSTEMI/PCI: LM nl, LAD min irregs, D1 30-40, LCX 30-63m, 100d (3.5x18 Resolute DES), OM1/2 nl, RCA 50p, R->L collats, EF 55%, mild MR.  [name] Hyperlipidemia   . Hypertension    put on bp meds. when he was heavier and was not exercising  . Ischemic cardiomyopathy    Echo 3/16:  EF 45-50%, inf-lat and inf HK, Gr 2 DD, mod MR, mild LAE  . Mitral regurgitation   . Osteoarthritis of right hip   . Recurrent upper respiratory infection (URI)    current cold being treated with otc meds.    Past Surgical History:  Procedure Laterality Date  . CORONARY ANGIOPLASTY WITH STENT PLACEMENT  11/12/2014   "1"  . JOINT REPLACEMENT    . LEFT HEART CATHETERIZATION WITH CORONARY ANGIOGRAM N/A 11/12/2014   Procedure: LEFT HEART CATHETERIZATION WITH CORONARY ANGIOGRAM;  Surgeon: Blane Ohara, MD;  Location: [facility] CATH LAB;  Service: Cardiovascular;  Laterality: N/A;  . TONSILLECTOMY  1950's  . TOTAL HIP ARTHROPLASTY  08/30/2011   Procedure: TOTAL HIP ARTHROPLASTY;   Surgeon: Garald Balding, MD;  Location: [place];  Service: Orthopedics;  Laterality: Right;  [name] VASECTOMY      Current Outpatient Prescriptions  Medication Sig Dispense Refill  . Ascorbic Acid (VITAMIN C) 1000 MG tablet Take 1,000 mg by mouth daily.    [name] aspirin 81 MG chewable tablet Chew 1 tablet (81 mg total) by mouth daily.    [name] atorvastatin (LIPITOR) 80 MG tablet TAKE ONE TABLET BY MOUTH EVERY DAY AT 6 IN THE EVENING 90 tablet 3  . benazepril-hydrochlorthiazide (LOTENSIN HCT) 20-25 MG per tablet Take 1 tablet by mouth daily.    . metoprolol tartrate (LOPRESSOR) 25 MG tablet TAKE 1/2 TABLET BY MOUTH TWO TIMES A DAY 90 tablet 3  . Multiple Vitamins-Minerals (MULTIVITAMIN WITH MINERALS) tablet Take 2 tablets by mouth daily.    . nitroGLYCERIN (NITROSTAT) 0.4 MG SL tablet Place 1 tablet (0.4 mg total) under the tongue every 5 (five) minutes x 3 doses as needed for chest pain. 25 tablet 3  . Omega-3 Fatty Acids (FISH OIL) 1200 MG CAPS Take 2,400 mg by mouth daily.     No current facility-administered medications for this visit.    Allergies:   Patient has no known allergies.   Social History:  The patient  reports that he has never smoked. He has never used smokeless tobacco. He reports that he drinks alcohol. He reports that  he does not use drugs.   Family History:  The patient's family history includes Lung cancer in his father.   ROS:  Please see the history of present illness.   All other systems are reviewed and negative.   PHYSICAL EXAM: VS:  BP 124/72   Pulse (!) 50   Ht 5\' 10"  (1.778 m)   Wt 197 lb 9.6 oz (89.6 kg)   BMI 28.35 kg/m  , BMI Body mass index is 28.35 kg/m. GEN: Well nourished, well developed, in no acute distress  HEENT: normal  Neck: no JVD, no masses. No carotid bruits Cardiac: RRR without murmur or gallop                Respiratory:  clear to auscultation bilaterally, normal work of breathing GI: soft, nontender, nondistended, + BS MS: no deformity or  atrophy  Ext: no pretibial edema, pedal pulses 2+= bilaterally Skin: warm and dry, no rash Neuro:  Strength and sensation are intact Psych: euthymic mood, full affect  EKG:  EKG is ordered today. The ekg ordered today shows sinus bradycardia 50 bpm, possible age-indeterminate inferior infarct, otherwise no significant changes  Recent Labs: No results found for requested labs within last 8760 hours.   Lipid Panel     Component Value Date/Time   CHOL 105 01/14/2015 0922   TRIG 125.0 01/14/2015 0922   HDL 37.80 (L) 01/14/2015 0922   CHOLHDL 3 01/14/2015 0922   VLDL 25.0 01/14/2015 0922   LDLCALC 42 01/14/2015 0922      Wt Readings from Last 3 Encounters:  12/30/16 197 lb 9.6 oz (89.6 kg)  12/18/15 187 lb 12.8 oz (85.2 kg)  05/20/15 180 lb 1.9 oz (81.7 kg)     ASSESSMENT AND PLAN: 1.  CAD, native vessel, with old MI: No symptoms of angina.The patient will continue his current medical program. No changes are made today.  2. Hyperlipidemia: Patient is treated with atorvastatin 80 mg daily. Lipids are followed by his primary care physician. He brings in a copy of his labs demonstrating cholesterol 151, triglycerides 172, HDL 40, LDL 77. His lipids are in good range, but they are increased from last year when his LDL was 58. He states that he's been eating a good bit of eggs salad and thinks this may be part of the problem. He's also gained about 6 or 8 pounds since last. We discussed lifestyle modification and dietary strategies. He will continue on atorvastatin 80 mg.  3. Essential hypertension: Blood pressure is controlled on benazepril, hydrochlorothiazide, and metoprolol  Current medicines are reviewed with the patient today.  The patient does not have concerns regarding medicines.  Labs/ tests ordered today include:   Orders Placed This Encounter  Procedures  . EKG 12-Lead   Disposition:   FU one year  Signed, Sherren Mocha, MD  12/30/2016 3:54 PM    [facility] [address], [place], [place]  [address] Phone: [phone]; Fax: [phone]

## 2017-01-21 ENCOUNTER — Other Ambulatory Visit: Payer: Self-pay | Admitting: Nurse Practitioner

## 2017-01-29 ENCOUNTER — Other Ambulatory Visit: Payer: Self-pay | Admitting: Nurse Practitioner

## 2017-03-24 DIAGNOSIS — D126 Benign neoplasm of colon, unspecified: Secondary | ICD-10-CM | POA: Diagnosis not present

## 2017-03-24 DIAGNOSIS — K5730 Diverticulosis of large intestine without perforation or abscess without bleeding: Secondary | ICD-10-CM | POA: Diagnosis not present

## 2017-03-24 DIAGNOSIS — Z86010 Personal history of colonic polyps: Secondary | ICD-10-CM | POA: Diagnosis not present

## 2017-03-28 DIAGNOSIS — D126 Benign neoplasm of colon, unspecified: Secondary | ICD-10-CM | POA: Diagnosis not present

## 2018-01-03 DIAGNOSIS — I10 Essential (primary) hypertension: Secondary | ICD-10-CM | POA: Diagnosis not present

## 2018-01-03 DIAGNOSIS — E7800 Pure hypercholesterolemia, unspecified: Secondary | ICD-10-CM | POA: Diagnosis not present

## 2018-01-03 DIAGNOSIS — Z0000 Encounter for general adult medical examination without abnormal findings: Secondary | ICD-10-CM | POA: Diagnosis not present

## 2018-01-04 DIAGNOSIS — E7800 Pure hypercholesterolemia, unspecified: Secondary | ICD-10-CM | POA: Diagnosis not present

## 2018-01-04 DIAGNOSIS — I10 Essential (primary) hypertension: Secondary | ICD-10-CM | POA: Diagnosis not present

## 2018-01-08 ENCOUNTER — Other Ambulatory Visit: Payer: Self-pay | Admitting: Nurse Practitioner

## 2018-02-26 ENCOUNTER — Other Ambulatory Visit: Payer: Self-pay | Admitting: Cardiovascular Disease

## 2018-02-26 ENCOUNTER — Ambulatory Visit: Payer: Medicare Other | Admitting: Cardiovascular Disease

## 2018-03-05 ENCOUNTER — Encounter: Payer: Self-pay | Admitting: Cardiovascular Disease

## 2018-03-05 ENCOUNTER — Ambulatory Visit (INDEPENDENT_AMBULATORY_CARE_PROVIDER_SITE_OTHER): Payer: Medicare Other | Admitting: Cardiovascular Disease

## 2018-03-05 VITALS — BP 132/68 | HR 58 | Ht 70.0 in | Wt 189.5 lb

## 2018-03-05 DIAGNOSIS — I2510 Atherosclerotic heart disease of native coronary artery without angina pectoris: Secondary | ICD-10-CM

## 2018-03-05 NOTE — Patient Instructions (Signed)

## 2018-03-05 NOTE — Progress Notes (Signed)
Cardiology Office Note Date:  03/07/2018   ID:  MARICELA KAWAHARA, DOB 1945-10-28, MRN [id number]  PCP:  Lawerance Cruel, MD  Cardiologist:  Sherren Mocha, MD    Chief Complaint  Patient presents with  . Coronary Artery Disease     History of Present Illness: Leonard Rodriguez is a 72 y.o. male who presents for follow-up of coronary artery disease.  He initially presented in 2016 with a non-STEMI and was found to have total occlusion of left circumflex treated with a drug-eluting stent.  There is no other obstructive CAD identified and his LV function was mildly reduced with an LVEF of 55%.  The patient is here alone today.  He is doing very well.  He had labs drawn with his primary physician in April, demonstrating a cholesterol 146, HDL 36, LDL 69, and triglycerides 206.  He was concerned about his elevated triglycerides and has subsequently changed his diet and increased his exercise.  He is lost over 10 pounds and is feeling very well.  He denies any exertional symptoms.  He specifically denies chest pain, shortness of breath, leg swelling, or heart palpitations.  Past Medical History:  Diagnosis Date  . Coronary artery disease    a. 11/2014 NSTEMI/PCI: LM nl, LAD min irregs, D1 30-40, LCX 30-55m, 100d (3.5x18 Resolute DES), OM1/2 nl, RCA 50p, R->L collats, EF 55%, mild MR.  [name] Hyperlipidemia   . Hypertension    put on bp meds. when he was heavier and was not exercising  . Ischemic cardiomyopathy    Echo 3/16:  EF 45-50%, inf-lat and inf HK, Gr 2 DD, mod MR, mild LAE  . Mitral regurgitation   . Osteoarthritis of right hip   . Recurrent upper respiratory infection (URI)    current cold being treated with otc meds.    Past Surgical History:  Procedure Laterality Date  . CORONARY ANGIOPLASTY WITH STENT PLACEMENT  11/12/2014   "1"  . JOINT REPLACEMENT    . LEFT HEART CATHETERIZATION WITH CORONARY ANGIOGRAM N/A 11/12/2014   Procedure: LEFT HEART CATHETERIZATION WITH CORONARY  ANGIOGRAM;  Surgeon: Blane Ohara, MD;  Location: [facility] CATH LAB;  Service: Cardiovascular;  Laterality: N/A;  . TONSILLECTOMY  1950's  . TOTAL HIP ARTHROPLASTY  08/30/2011   Procedure: TOTAL HIP ARTHROPLASTY;  Surgeon: Garald Balding, MD;  Location: [place];  Service: Orthopedics;  Laterality: Right;  [name] VASECTOMY      Current Outpatient Medications  Medication Sig Dispense Refill  . Ascorbic Acid (VITAMIN C) 1000 MG tablet Take 1,000 mg by mouth daily.    [name] aspirin 81 MG chewable tablet Chew 1 tablet (81 mg total) by mouth daily.    [name] atorvastatin (LIPITOR) 80 MG tablet Take 1 tablet (80 mg total) by mouth daily at 6 PM. Please keep appt with Dr. Burt Knack 03/05/18 thank you. 90 tablet 2  . benazepril-hydrochlorthiazide (LOTENSIN HCT) 20-25 MG per tablet Take 1 tablet by mouth daily.    . metoprolol tartrate (LOPRESSOR) 25 MG tablet Take 1 tablet (25 mg total) by mouth 2 (two) times daily. Please keep upcoming appt for future refills Thank you 90 tablet 1  . Multiple Vitamins-Minerals (MULTIVITAMIN WITH MINERALS) tablet Take 2 tablets by mouth daily.    . nitroGLYCERIN (NITROSTAT) 0.4 MG SL tablet Place 1 tablet (0.4 mg total) under the tongue every 5 (five) minutes x 3 doses as needed for chest pain. 25 tablet 3  . Omega-3 Fatty Acids (FISH OIL) 1200 MG  CAPS Take 2,400 mg by mouth daily.     No current facility-administered medications for this visit.    Allergies:   Patient has no known allergies.  Social History:  The patient  reports that he has never smoked. He has never used smokeless tobacco. He reports that he drinks alcohol. He reports that he does not use drugs.   Family History:  The patient's family history includes Lung cancer in his father.   ROS:  Please see the history of present illness.  Otherwise, review of systems is positive for sinus bradycardia 57 bpm, sinus arrhythmia, otherwise within normal limits.  All other systems are reviewed and negative.   PHYSICAL EXAM: VS:   BP 132/68   Pulse (!) 58   Ht 5\' 10"  (1.778 m)   Wt 189 lb 8 oz (86 kg)   SpO2 97%   BMI 27.19 kg/m  , BMI Body mass index is 27.19 kg/m. GEN: Well nourished, well developed, in no acute distress  HEENT: normal  Neck: no JVD, no masses. No carotid bruits Cardiac: RRR without murmur or gallop Respiratory:  clear to auscultation bilaterally, normal work of breathing GI: soft, nontender, nondistended, + BS MS: no deformity or atrophy  Ext: no pretibial edema, pedal pulses 2+= bilaterally Skin: warm and dry, no rash Neuro:  Strength and sensation are intact Psych: euthymic mood, full affect  EKG:  EKG is ordered today. The ekg ordered today shows sinus bradycardia 57 bpm, sinus arrhythmia, otherwise normal.  Recent Labs: No results found for requested labs within last 8760 hours.   Lipid Panel     Component Value Date/Time   CHOL 105 01/14/2015 0922   TRIG 125.0 01/14/2015 0922   HDL 37.80 (L) 01/14/2015 0922   CHOLHDL 3 01/14/2015 0922   VLDL 25.0 01/14/2015 0922   LDLCALC 42 01/14/2015 0922      Wt Readings from Last 3 Encounters:  03/05/18 189 lb 8 oz (86 kg)  12/30/16 197 lb 9.6 oz (89.6 kg)  12/18/15 187 lb 12.8 oz (85.2 kg)     ASSESSMENT AND PLAN: 1.  Coronary artery disease, native vessel, without angina: The patient is stable on his current medical program.  He takes aspirin for antiplatelet therapy, high intensity statin drug, and a beta-blocker.  No changes are made today.  2.  Hyperlipidemia: I reviewed his lipid panel.  He is on a combination of high-dose atorvastatin and fish oil.  I agree that his triglyceride elevation is probably diet related.  He will have repeat lipids next year as part of his annual physical.  His LDL cholesterol is at goal.  We discussed lifestyle modification and he is on the right track.  3.  Hypertension: Treated with benazepril, hydrochlorothiazide, and metoprolol.  Blood pressure is well controlled.  Current medicines are  reviewed with the patient today.  The patient does not have concerns regarding medicines.  Labs/ tests ordered today include:   Orders Placed This Encounter  Procedures  . EKG 12-Lead    Disposition:   FU one year  Signed, Sherren Mocha, MD  03/07/2018 10:06 PM    [facility] [address], [place], [place]  [address] Phone: [phone]; Fax: [phone]

## 2018-03-07 ENCOUNTER — Encounter: Payer: Self-pay | Admitting: Cardiovascular Disease

## 2018-03-24 ENCOUNTER — Other Ambulatory Visit: Payer: Self-pay | Admitting: Cardiovascular Disease

## 2018-09-26 DIAGNOSIS — Z0101 Encounter for examination of eyes and vision with abnormal findings: Secondary | ICD-10-CM | POA: Diagnosis not present

## 2018-11-23 ENCOUNTER — Other Ambulatory Visit: Payer: Self-pay | Admitting: Cardiovascular Disease

## 2019-02-21 ENCOUNTER — Other Ambulatory Visit: Payer: Self-pay | Admitting: Cardiovascular Disease

## 2019-05-13 ENCOUNTER — Other Ambulatory Visit: Payer: Self-pay

## 2019-05-13 ENCOUNTER — Ambulatory Visit: Payer: Medicare HMO | Admitting: Cardiovascular Disease

## 2019-05-13 ENCOUNTER — Encounter: Payer: Self-pay | Admitting: Cardiovascular Disease

## 2019-05-13 VITALS — BP 106/62 | HR 55 | Ht 71.0 in | Wt 184.0 lb

## 2019-05-13 DIAGNOSIS — E782 Mixed hyperlipidemia: Secondary | ICD-10-CM | POA: Diagnosis not present

## 2019-05-13 DIAGNOSIS — I2510 Atherosclerotic heart disease of native coronary artery without angina pectoris: Secondary | ICD-10-CM

## 2019-05-13 DIAGNOSIS — I10 Essential (primary) hypertension: Secondary | ICD-10-CM

## 2019-05-13 MED ORDER — NITROGLYCERIN 0.4 MG SL SUBL: 0.4000 mg | SUBLINGUAL_TABLET | SUBLINGUAL | 3 refills | Status: AC | PRN

## 2019-05-13 NOTE — Progress Notes (Signed)
Cardiology Office Note:    Date:  05/13/2019   ID:  Leonard Rodriguez, DOB [birth year], MRN [id number]  PCP:  Lawerance Cruel, MD  Cardiologist:  Sherren Mocha, MD  Electrophysiologist:  None   Referring MD: Lawerance Cruel, MD   Chief Complaint  Patient presents with  . Coronary Artery Disease    History of Present Illness:    Leonard Rodriguez is a 73 y.o. male with a hx of coronary artery disease, presenting for follow-up evaluation.  The patient initially presented in 2016 with a non-STEMI and was found to have total occlusion of the left circumflex treated with a drug-eluting stent.  He otherwise had nonobstructive disease and was noted to have mildly reduced LV systolic function with an LVEF of 50 to 55%.  When I saw him last 1 year ago he was feeling well and had intentionally lost about 10 pounds with diet and exercise. He has been able to stay active during the Covid-19 pandemic with outdoor exercise. He's walking about 5-6 miles daily. No exertional symptoms. Today, he denies symptoms of palpitations, chest pain, shortness of breath, orthopnea, PND, lower extremity edema, dizziness, or syncope. He has upcoming labs with Dr Harrington Challenger in September at the time of his annual physical examination.   Past Medical History:  Diagnosis Date  . Coronary artery disease    a. 11/2014 NSTEMI/PCI: LM nl, LAD min irregs, D1 30-40, LCX 30-89m, 100d (3.5x18 Resolute DES), OM1/2 nl, RCA 50p, R->L collats, EF 55%, mild MR.  [name] Hyperlipidemia   . Hypertension    put on bp meds. when he was heavier and was not exercising  . Ischemic cardiomyopathy    Echo 3/16:  EF 45-50%, inf-lat and inf HK, Gr 2 DD, mod MR, mild LAE  . Mitral regurgitation   . Osteoarthritis of right hip   . Recurrent upper respiratory infection (URI)    current cold being treated with otc meds.    Past Surgical History:  Procedure Laterality Date  . CORONARY ANGIOPLASTY WITH STENT PLACEMENT  11/12/2014   "1"  . JOINT  REPLACEMENT    . LEFT HEART CATHETERIZATION WITH CORONARY ANGIOGRAM N/A 11/12/2014   Procedure: LEFT HEART CATHETERIZATION WITH CORONARY ANGIOGRAM;  Surgeon: Blane Ohara, MD;  [facility] CATH LAB;  Service: Cardiovascular;  Laterality: N/A;  . TONSILLECTOMY  1950's  . TOTAL HIP ARTHROPLASTY  08/30/2011   Procedure: TOTAL HIP ARTHROPLASTY;  Surgeon: Garald Balding, MD;  Location: [place];  Service: Orthopedics;  Laterality: Right;  [name] VASECTOMY      Current Medications: Current Meds  Medication Sig  . Ascorbic Acid (VITAMIN C) 1000 MG tablet Take 1,000 mg by mouth daily.  [name] aspirin 81 MG chewable tablet Chew 1 tablet (81 mg total) by mouth daily.  [name] atorvastatin (LIPITOR) 80 MG tablet TAKE ONE TABLET BY MOUTH DAILY AT 6 PM  . benazepril-hydrochlorthiazide (LOTENSIN HCT) 20-25 MG per tablet Take 1 tablet by mouth daily.  . metoprolol tartrate (LOPRESSOR) 25 MG tablet TAKE ONE TABLET BY MOUTH TWICE A DAY  . Multiple Vitamins-Minerals (MULTIVITAMIN WITH MINERALS) tablet Take 1 tablet by mouth daily.   . nitroGLYCERIN (NITROSTAT) 0.4 MG SL tablet Place 1 tablet (0.4 mg total) under the tongue every 5 (five) minutes x 3 doses as needed for chest pain.  . Omega-3 Fatty Acids (FISH OIL) 1200 MG CAPS Take 2,400 mg by mouth daily.     Allergies:   Patient has no known allergies.  Social History   Socioeconomic History  . Marital status: Married    Spouse name: Not on file  . Number of children: Not on file  . Years of education: Not on file  . Highest education level: Not on file  Occupational History  . Not on file  Social Needs  . Financial resource strain: Not on file  . Food insecurity    Worry: Not on file    Inability: Not on file  . Transportation needs    Medical: Not on file    Non-medical: Not on file  Tobacco Use  . Smoking status: Never Smoker  . Smokeless tobacco: Never Used  Substance and Sexual Activity  . Alcohol use: Yes    Comment: 11/12/2014 "beer maybe  twice/month"  . Drug use: No  . Sexual activity: Not Currently  Lifestyle  . Physical activity    Days per week: Not on file    Minutes per session: Not on file  . Stress: Not on file  Relationships  . Social Herbalist on phone: Not on file    Gets together: Not on file    Attends religious service: Not on file    Active member of club or organization: Not on file    Attends meetings of clubs or organizations: Not on file    Relationship status: Not on file  Other Topics Concern  . Not on file  Social History Narrative  . Not on file     Family History: The patient's family history includes Lung cancer in his father. There is no history of Heart attack.  ROS:   Please see the history of present illness.    All other systems reviewed and are negative.  EKGs/Labs/Other Studies Reviewed:    EKG:  EKG is ordered today.  The ekg ordered today demonstrates sinus bradycardia 55 bpm, age-indeterminate inferior infarct  Recent Labs: No results found for requested labs within last 8760 hours.  Recent Lipid Panel    Component Value Date/Time   CHOL 105 01/14/2015 0922   TRIG 125.0 01/14/2015 0922   HDL 37.80 (L) 01/14/2015 0922   CHOLHDL 3 01/14/2015 0922   VLDL 25.0 01/14/2015 0922   LDLCALC 42 01/14/2015 0922    Physical Exam:    VS:  BP 106/62   Pulse (!) 55   Ht 5\' 11"  (1.803 m)   Wt 184 lb (83.5 kg)   SpO2 97%   BMI 25.66 kg/m     Wt Readings from Last 3 Encounters:  05/13/19 184 lb (83.5 kg)  03/05/18 189 lb 8 oz (86 kg)  12/30/16 197 lb 9.6 oz (89.6 kg)     GEN:  Well nourished, well developed in no acute distress HEENT: Normal NECK: No JVD; No carotid bruits LYMPHATICS: No lymphadenopathy CARDIAC: bradycardic and regular, no murmurs, rubs, gallops RESPIRATORY:  Clear to auscultation without rales, wheezing or rhonchi  ABDOMEN: Soft, non-tender, non-distended MUSCULOSKELETAL:  No edema; No deformity  SKIN: Warm and dry NEUROLOGIC:  Alert  and oriented x 3 PSYCHIATRIC:  Normal affect   ASSESSMENT:    1. Coronary artery disease involving native coronary artery of native heart without angina pectoris   2. Mixed hyperlipidemia   3. Essential hypertension    PLAN:    In order of problems listed above:  1. Doing very well on current Rx with excellent lifestyle/exercise program. Continue same Rx. 2. Lipids pending at the time of his annual physical next month. The patient  will have his labs forwarded. He continues on a high-intensity statin drug. 3. BP control is excellent   Medication Adjustments/Labs and Tests Ordered: Current medicines are reviewed at length with the patient today.  Concerns regarding medicines are outlined above.  No orders of the defined types were placed in this encounter.  No orders of the defined types were placed in this encounter.   There are no Patient Instructions on file for this visit.   Signed, Sherren Mocha, MD  05/13/2019 2:56 PM    [place]

## 2019-05-13 NOTE — Patient Instructions (Signed)

## 2019-05-22 ENCOUNTER — Other Ambulatory Visit: Payer: Self-pay | Admitting: Cardiovascular Disease

## 2019-07-23 DIAGNOSIS — I2510 Atherosclerotic heart disease of native coronary artery without angina pectoris: Secondary | ICD-10-CM | POA: Diagnosis not present

## 2019-07-23 DIAGNOSIS — E7800 Pure hypercholesterolemia, unspecified: Secondary | ICD-10-CM | POA: Diagnosis not present

## 2019-07-23 DIAGNOSIS — I10 Essential (primary) hypertension: Secondary | ICD-10-CM | POA: Diagnosis not present

## 2019-09-24 DIAGNOSIS — I10 Essential (primary) hypertension: Secondary | ICD-10-CM | POA: Diagnosis not present

## 2019-09-24 DIAGNOSIS — I2510 Atherosclerotic heart disease of native coronary artery without angina pectoris: Secondary | ICD-10-CM | POA: Diagnosis not present

## 2019-09-24 DIAGNOSIS — E7800 Pure hypercholesterolemia, unspecified: Secondary | ICD-10-CM | POA: Diagnosis not present

## 2019-10-23 DIAGNOSIS — I2510 Atherosclerotic heart disease of native coronary artery without angina pectoris: Secondary | ICD-10-CM | POA: Diagnosis not present

## 2019-10-23 DIAGNOSIS — E7800 Pure hypercholesterolemia, unspecified: Secondary | ICD-10-CM | POA: Diagnosis not present

## 2019-10-23 DIAGNOSIS — I10 Essential (primary) hypertension: Secondary | ICD-10-CM | POA: Diagnosis not present

## 2019-11-03 ENCOUNTER — Ambulatory Visit: Payer: Medicare HMO | Attending: Internal Medicine

## 2019-11-03 DIAGNOSIS — Z23 Encounter for immunization: Secondary | ICD-10-CM

## 2019-11-03 NOTE — Progress Notes (Signed)
   Covid-19 Vaccination Clinic  Name:  Leonard Rodriguez    MRN: [id number] DOB: [birth year]  11/03/2019  [name] was observed post Covid-19 immunization for 15 minutes without incidence. He was provided with Vaccine Information Sheet and instruction to access the V-Safe system.   [name] was instructed to call 911 with any severe reactions post vaccine: [name] Difficulty breathing  . Swelling of your face and throat  . A fast heartbeat  . A bad rash all over your body  . Dizziness and weakness    Immunizations Administered    Name Date Dose VIS Date Route   Pfizer COVID-19 Vaccine 11/03/2019  3:30 PM 0.3 mL 08/23/2019 Intramuscular   Manufacturer: [place]   Lot: [id number]   [place]: [id number]

## 2019-11-08 DIAGNOSIS — Z0100 Encounter for examination of eyes and vision without abnormal findings: Secondary | ICD-10-CM | POA: Diagnosis not present

## 2019-11-08 DIAGNOSIS — H524 Presbyopia: Secondary | ICD-10-CM | POA: Diagnosis not present

## 2019-11-27 ENCOUNTER — Ambulatory Visit: Payer: Medicare HMO | Attending: Internal Medicine

## 2019-11-27 DIAGNOSIS — Z23 Encounter for immunization: Secondary | ICD-10-CM

## 2019-11-27 NOTE — Progress Notes (Signed)
   Covid-19 Vaccination Clinic  Name:  Leonard Rodriguez    MRN: [id number] DOB: 1946-05-06  11/27/2019  [name] was observed post Covid-19 immunization for 15 minutes without incident. He was provided with Vaccine Information Sheet and instruction to access the V-Safe system.   [name] was instructed to call 911 with any severe reactions post vaccine: [name] Difficulty breathing  . Swelling of face and throat  . A fast heartbeat  . A bad rash all over body  . Dizziness and weakness   Immunizations Administered    Name Date Dose VIS Date Route   Pfizer COVID-19 Vaccine 11/27/2019  1:32 PM 0.3 mL 08/23/2019 Intramuscular   Manufacturer: [place]   Lot: [id number]   [place]: [id number]

## 2020-01-21 DIAGNOSIS — I2510 Atherosclerotic heart disease of native coronary artery without angina pectoris: Secondary | ICD-10-CM | POA: Diagnosis not present

## 2020-01-21 DIAGNOSIS — I10 Essential (primary) hypertension: Secondary | ICD-10-CM | POA: Diagnosis not present

## 2020-01-21 DIAGNOSIS — E7800 Pure hypercholesterolemia, unspecified: Secondary | ICD-10-CM | POA: Diagnosis not present

## 2020-05-18 ENCOUNTER — Other Ambulatory Visit: Payer: Self-pay | Admitting: Cardiovascular Disease

## 2020-05-26 ENCOUNTER — Encounter: Payer: Self-pay | Admitting: Cardiovascular Disease

## 2020-05-26 ENCOUNTER — Other Ambulatory Visit: Payer: Self-pay

## 2020-05-26 ENCOUNTER — Ambulatory Visit: Payer: Medicare HMO | Admitting: Cardiovascular Disease

## 2020-05-26 VITALS — BP 132/68 | HR 56 | Ht 71.0 in | Wt 190.2 lb

## 2020-05-26 DIAGNOSIS — E782 Mixed hyperlipidemia: Secondary | ICD-10-CM

## 2020-05-26 DIAGNOSIS — I10 Essential (primary) hypertension: Secondary | ICD-10-CM

## 2020-05-26 DIAGNOSIS — I2510 Atherosclerotic heart disease of native coronary artery without angina pectoris: Secondary | ICD-10-CM

## 2020-05-26 NOTE — Progress Notes (Signed)
Cardiology Office Note:    Date:  05/26/2020   ID:  Leonard Rodriguez, DOB 1946-06-03, MRN [id number]  PCP:  Lawerance Cruel, MD  [facility] Cardiologist:  Sherren Mocha, MD  [place] Electrophysiologist:  None   Referring MD: Lawerance Cruel, MD   Chief Complaint  Patient presents with  . Coronary Artery Disease    History of Present Illness:    Leonard Rodriguez is a 74 y.o. male with a hx of coronary artery disease, presenting for follow-up evaluation. The patient initially presented in 2016 with a non-STEMI and was found to have total occlusion of the left circumflex treated with a drug-eluting stent.  He otherwise had nonobstructive disease and was noted to have mildly reduced LV systolic function with an LVEF of 50 to 55%. He was last seen 05/13/2019 at which time he was clinically stable, reporting an active lifestyle with 5-6 mile walks on a daily basis.   He continues with his exercise program. He is walking 8 miles on Tuesdays and Thursdays, 4 miles on M/W/F. Lifting weights on M/W/F. Today, he denies symptoms of palpitations, chest pain, shortness of breath, orthopnea, PND, lower extremity edema, dizziness, or syncope.  He has not eaten a single [place] fry or potato chips since the time of his heart attack.   Past Medical History:  Diagnosis Date  . Coronary artery disease    a. 11/2014 NSTEMI/PCI: LM nl, LAD min irregs, D1 30-40, LCX 30-61m, 100d (3.5x18 Resolute DES), OM1/2 nl, RCA 50p, R->L collats, EF 55%, mild MR.  [name] Hyperlipidemia   . Hypertension    put on bp meds. when he was heavier and was not exercising  . Ischemic cardiomyopathy    Echo 3/16:  EF 45-50%, inf-lat and inf HK, Gr 2 DD, mod MR, mild LAE  . Mitral regurgitation   . Osteoarthritis of right hip   . Recurrent upper respiratory infection (URI)    current cold being treated with otc meds.    Past Surgical History:  Procedure Laterality Date  . CORONARY ANGIOPLASTY WITH STENT PLACEMENT   11/12/2014   "1"  . JOINT REPLACEMENT    . LEFT HEART CATHETERIZATION WITH CORONARY ANGIOGRAM N/A 11/12/2014   Procedure: LEFT HEART CATHETERIZATION WITH CORONARY ANGIOGRAM;  Surgeon: Blane Ohara, MD;  Location: [facility] CATH LAB;  Service: Cardiovascular;  Laterality: N/A;  . TONSILLECTOMY  1950's  . TOTAL HIP ARTHROPLASTY  08/30/2011   Procedure: TOTAL HIP ARTHROPLASTY;  Surgeon: Garald Balding, MD;  Location: [place];  Service: Orthopedics;  Laterality: Right;  [name] VASECTOMY      Current Medications: Current Meds  Medication Sig  . Ascorbic Acid (VITAMIN C) 1000 MG tablet Take 1,000 mg by mouth daily.  [name] aspirin 81 MG chewable tablet Chew 1 tablet (81 mg total) by mouth daily.  [name] atorvastatin (LIPITOR) 80 MG tablet TAKE ONE TABLET BY MOUTH DAILY AT 6 P.M.  . benazepril-hydrochlorthiazide (LOTENSIN HCT) 20-25 MG per tablet Take 1 tablet by mouth daily.  . metoprolol tartrate (LOPRESSOR) 25 MG tablet TAKE ONE TABLET BY MOUTH TWICE A DAY  . Multiple Vitamins-Minerals (MULTIVITAMIN WITH MINERALS) tablet Take 1 tablet by mouth daily.   . nitroGLYCERIN (NITROSTAT) 0.4 MG SL tablet Place 1 tablet (0.4 mg total) under the tongue every 5 (five) minutes x 3 doses as needed for chest pain.  . Omega-3 Fatty Acids (FISH OIL) 1200 MG CAPS Take 2,400 mg by mouth daily.     Allergies:  Patient has no known allergies.   Social History   Socioeconomic History  . Marital status: Married    Spouse name: Not on file  . Number of children: Not on file  . Years of education: Not on file  . Highest education level: Not on file  Occupational History  . Not on file  Tobacco Use  . Smoking status: Never Smoker  . Smokeless tobacco: Never Used  Vaping Use  . Vaping Use: Never used  Substance and Sexual Activity  . Alcohol use: Yes    Comment: 11/12/2014 "beer maybe twice/month"  . Drug use: No  . Sexual activity: Not Currently  Other Topics Concern  . Not on file  Social History Narrative  . Not on  file   Social Determinants of Health   Financial Resource Strain:   . Difficulty of Paying Living Expenses: Not on file  Food Insecurity:   . Worried About Charity fundraiser in the Last Year: Not on file  . Ran Out of Food in the Last Year: Not on file  Transportation Needs:   . Lack of Transportation (Medical): Not on file  . Lack of Transportation (Non-Medical): Not on file  Physical Activity:   . Days of Exercise per Week: Not on file  . Minutes of Exercise per Session: Not on file  Stress:   . Feeling of Stress : Not on file  Social Connections:   . Frequency of Communication with Friends and Family: Not on file  . Frequency of Social Gatherings with Friends and Family: Not on file  . Attends Religious Services: Not on file  . Active Member of Clubs or Organizations: Not on file  . Attends Archivist Meetings: Not on file  . Marital Status: Not on file     Family History: The patient's family history includes Lung cancer in his father. There is no history of Heart attack.  ROS:   Please see the history of present illness.    All other systems reviewed and are negative.  EKGs/Labs/Other Studies Reviewed:    EKG:  EKG is ordered today.  The ekg ordered today demonstrates sinus brady 56 bpm, age-indeterminate inferior infarct, no significant change from previous  Recent Labs: No results found for requested labs within last 8760 hours.  Recent Lipid Panel    Component Value Date/Time   CHOL 105 01/14/2015 0922   TRIG 125.0 01/14/2015 0922   HDL 37.80 (L) 01/14/2015 0922   CHOLHDL 3 01/14/2015 0922   VLDL 25.0 01/14/2015 0922   LDLCALC 42 01/14/2015 0922    Physical Exam:    VS:  BP 132/68   Pulse (!) 56   Ht 5\' 11"  (1.803 m)   Wt 190 lb 3.2 oz (86.3 kg)   BMI 26.53 kg/m     Wt Readings from Last 3 Encounters:  05/26/20 190 lb 3.2 oz (86.3 kg)  05/13/19 184 lb (83.5 kg)  03/05/18 189 lb 8 oz (86 kg)     GEN:  Well nourished, well developed  in no acute distress HEENT: Normal NECK: No JVD; No carotid bruits LYMPHATICS: No lymphadenopathy CARDIAC: RRR, no murmurs, rubs, gallops RESPIRATORY:  Clear to auscultation without rales, wheezing or rhonchi  ABDOMEN: Soft, non-tender, non-distended MUSCULOSKELETAL:  No edema; No deformity  SKIN: Warm and dry NEUROLOGIC:  Alert and oriented x 3 PSYCHIATRIC:  Normal affect   ASSESSMENT:    1. Coronary artery disease involving native coronary artery of native heart without angina  pectoris   2. Mixed hyperlipidemia   3. Essential hypertension    PLAN:    In order of problems listed above:  1. Stable without symptoms of angina.  Medical program reviewed and he is on guideline directed medications with aspirin, atorvastatin, benazepril, and metoprolol. 2. Treated with atorvastatin 80 mg daily.  Will update lipids and LFTs.  I reviewed his most recent labs from 2019 with an LDL of 69 and an ALT of 42 at that time. 3. Blood pressure well controlled on a combination of benazepril, hydrochlorothiazide, and low-dose metoprolol.   Medication Adjustments/Labs and Tests Ordered: Current medicines are reviewed at length with the patient today.  Concerns regarding medicines are outlined above.  Orders Placed This Encounter  Procedures  . CBC with Differential/Platelet  . Comprehensive metabolic panel  . Lipid panel  . TSH  . EKG 12-Lead   No orders of the defined types were placed in this encounter.   Patient Instructions  Medication Instructions:  Your provider recommends that you continue on your current medications as directed. Please refer to the Current Medication list given to you today.   *If you need a refill on your cardiac medications before your next appointment, please call your pharmacy*  Lab Work: Your provider recommends that you return for FASTING lab work. If you have labs (blood work) drawn today and your tests are completely normal, you will receive your results  only by: [name] MyChart Message (if you have MyChart) OR . A paper copy in the mail If you have any lab test that is abnormal or we need to change your treatment, we will call you to review the results.  Follow-Up: At [facility], you and your health needs are our priority.  As part of our continuing mission to provide you with exceptional heart care, we have created designated Provider Care Teams.  These Care Teams include your primary Cardiologist (physician) and Advanced Practice Providers (APPs -  Physician Assistants and Nurse Practitioners) who all work together to provide you with the care you need, when you need it. Your next appointment:   12 month(s) The format for your next appointment:   In Person Provider:   You may see Sherren Mocha, MD or one of the following Advanced Practice Providers on your designated Care Team:    Richardson Dopp, PA-C  Robbie Lis, [place]      Signed, Sherren Mocha, MD  05/26/2020 4:39 PM    [place]

## 2020-05-26 NOTE — Patient Instructions (Signed)
Medication Instructions:  Your provider recommends that you continue on your current medications as directed. Please refer to the Current Medication list given to you today.   *If you need a refill on your cardiac medications before your next appointment, please call your pharmacy*  Lab Work: Your provider recommends that you return for FASTING lab work.  If you have labs (blood work) drawn today and your tests are completely normal, you will receive your results only by: . MyChart Message (if you have MyChart) OR . A paper copy in the mail If you have any lab test that is abnormal or we need to change your treatment, we will call you to review the results.  Follow-Up: At [facility], you and your health needs are our priority.  As part of our continuing mission to provide you with exceptional heart care, we have created designated Provider Care Teams.  These Care Teams include your primary Cardiologist (physician) and Advanced Practice Providers (APPs -  Physician Assistants and Nurse Practitioners) who all work together to provide you with the care you need, when you need it. Your next appointment:   12 month(s) The format for your next appointment:   In Person Provider:   You may see Michael Cooper, MD or one of the following Advanced Practice Providers on your designated Care Team:    Scott Weaver, PA-C  Vin Bhagat, PA-C   

## 2020-05-28 ENCOUNTER — Other Ambulatory Visit: Payer: Medicare HMO | Admitting: *Deleted

## 2020-05-28 ENCOUNTER — Other Ambulatory Visit: Payer: Self-pay

## 2020-05-28 DIAGNOSIS — E782 Mixed hyperlipidemia: Secondary | ICD-10-CM | POA: Diagnosis not present

## 2020-05-28 DIAGNOSIS — I2510 Atherosclerotic heart disease of native coronary artery without angina pectoris: Secondary | ICD-10-CM | POA: Diagnosis not present

## 2020-05-28 DIAGNOSIS — R69 Illness, unspecified: Secondary | ICD-10-CM | POA: Diagnosis not present

## 2020-05-28 LAB — COMPREHENSIVE METABOLIC PANEL
ALT: 23 IU/L (ref 0–44)
AST: 26 IU/L (ref 0–40)
Albumin/Globulin Ratio: 1.3 (ref 1.2–2.2)
Albumin: 4.3 g/dL (ref 3.7–4.7)
Alkaline Phosphatase: 68 IU/L (ref 44–121)
BUN/Creatinine Ratio: 19 (ref 10–24)
BUN: 20 mg/dL (ref 8–27)
Bilirubin Total: 0.8 mg/dL (ref 0.0–1.2)
CO2: 27 mmol/L (ref 20–29)
Calcium: 9.3 mg/dL (ref 8.6–10.2)
Chloride: 99 mmol/L (ref 96–106)
Creatinine, Ser: 1.06 mg/dL (ref 0.76–1.27)
GFR calc Af Amer: 80 mL/min/{1.73_m2} (ref >59)
GFR calc non Af Amer: 69 mL/min/{1.73_m2} (ref >59)
Globulin, Total: 3.2 g/dL (ref 1.5–4.5)
Glucose: 98 mg/dL (ref 65–99)
Potassium: 3.9 mmol/L (ref 3.5–5.2)
Sodium: 139 mmol/L (ref 134–144)
Total Protein: 7.5 g/dL (ref 6.0–8.5)

## 2020-05-28 LAB — CBC WITH DIFFERENTIAL/PLATELET
Basophils Absolute: 0.1 10*3/uL (ref 0.0–0.2)
Basos: 1 %
EOS (ABSOLUTE): 0.3 10*3/uL (ref 0.0–0.4)
Eos: 3 %
Hematocrit: 41.5 % (ref 37.5–51.0)
Hemoglobin: 14.1 g/dL (ref 13.0–17.7)
Immature Grans (Abs): 0 10*3/uL (ref 0.0–0.1)
Immature Granulocytes: 0 %
Lymphocytes Absolute: 2.5 10*3/uL (ref 0.7–3.1)
Lymphs: 30 %
MCH: 31.1 pg (ref 26.6–33.0)
MCHC: 34 g/dL (ref 31.5–35.7)
MCV: 92 fL (ref 79–97)
Monocytes Absolute: 0.9 10*3/uL (ref 0.1–0.9)
Monocytes: 11 %
Neutrophils Absolute: 4.4 10*3/uL (ref 1.4–7.0)
Neutrophils: 55 %
Platelets: 226 10*3/uL (ref 150–450)
RBC: 4.53 x10E6/uL (ref 4.14–5.80)
RDW: 13.6 % (ref 11.6–15.4)
WBC: 8.2 10*3/uL (ref 3.4–10.8)

## 2020-05-28 LAB — LIPID PANEL
Chol/HDL Ratio: 3 ratio (ref 0.0–5.0)
Cholesterol, Total: 137 mg/dL (ref 100–199)
HDL: 45 mg/dL (ref >39)
LDL Chol Calc (NIH): 67 mg/dL (ref 0–99)
Triglycerides: 144 mg/dL (ref 0–149)
VLDL Cholesterol Cal: 25 mg/dL (ref 5–40)

## 2020-05-28 LAB — TSH: TSH: 3.36 u[IU]/mL (ref 0.450–4.500)

## 2020-07-28 DIAGNOSIS — E7800 Pure hypercholesterolemia, unspecified: Secondary | ICD-10-CM | POA: Diagnosis not present

## 2020-07-28 DIAGNOSIS — I10 Essential (primary) hypertension: Secondary | ICD-10-CM | POA: Diagnosis not present

## 2020-07-28 DIAGNOSIS — I2510 Atherosclerotic heart disease of native coronary artery without angina pectoris: Secondary | ICD-10-CM | POA: Diagnosis not present

## 2020-07-31 ENCOUNTER — Ambulatory Visit: Payer: Medicare HMO | Admitting: Cardiovascular Disease

## 2020-08-16 ENCOUNTER — Other Ambulatory Visit: Payer: Self-pay | Admitting: Cardiovascular Disease

## 2020-08-31 DIAGNOSIS — Z1389 Encounter for screening for other disorder: Secondary | ICD-10-CM | POA: Diagnosis not present

## 2020-08-31 DIAGNOSIS — Z0000 Encounter for general adult medical examination without abnormal findings: Secondary | ICD-10-CM | POA: Diagnosis not present

## 2020-09-03 DIAGNOSIS — I10 Essential (primary) hypertension: Secondary | ICD-10-CM | POA: Diagnosis not present

## 2020-09-03 DIAGNOSIS — Z125 Encounter for screening for malignant neoplasm of prostate: Secondary | ICD-10-CM | POA: Diagnosis not present

## 2020-10-22 DIAGNOSIS — I2510 Atherosclerotic heart disease of native coronary artery without angina pectoris: Secondary | ICD-10-CM | POA: Diagnosis not present

## 2020-10-22 DIAGNOSIS — E7800 Pure hypercholesterolemia, unspecified: Secondary | ICD-10-CM | POA: Diagnosis not present

## 2020-10-22 DIAGNOSIS — I10 Essential (primary) hypertension: Secondary | ICD-10-CM | POA: Diagnosis not present

## 2020-11-12 DIAGNOSIS — Z0100 Encounter for examination of eyes and vision without abnormal findings: Secondary | ICD-10-CM | POA: Diagnosis not present

## 2020-11-12 DIAGNOSIS — H5210 Myopia, unspecified eye: Secondary | ICD-10-CM | POA: Diagnosis not present

## 2020-11-12 DIAGNOSIS — Z135 Encounter for screening for eye and ear disorders: Secondary | ICD-10-CM | POA: Diagnosis not present

## 2020-11-12 DIAGNOSIS — I10 Essential (primary) hypertension: Secondary | ICD-10-CM | POA: Diagnosis not present

## 2020-11-12 DIAGNOSIS — E7800 Pure hypercholesterolemia, unspecified: Secondary | ICD-10-CM | POA: Diagnosis not present

## 2020-11-12 DIAGNOSIS — H259 Unspecified age-related cataract: Secondary | ICD-10-CM | POA: Diagnosis not present

## 2021-01-22 DIAGNOSIS — E7800 Pure hypercholesterolemia, unspecified: Secondary | ICD-10-CM | POA: Diagnosis not present

## 2021-01-22 DIAGNOSIS — I2510 Atherosclerotic heart disease of native coronary artery without angina pectoris: Secondary | ICD-10-CM | POA: Diagnosis not present

## 2021-01-22 DIAGNOSIS — I10 Essential (primary) hypertension: Secondary | ICD-10-CM | POA: Diagnosis not present

## 2021-03-03 ENCOUNTER — Ambulatory Visit: Payer: Medicare HMO | Admitting: Orthopaedic Surgery

## 2021-03-03 ENCOUNTER — Ambulatory Visit (INDEPENDENT_AMBULATORY_CARE_PROVIDER_SITE_OTHER): Payer: Medicare HMO

## 2021-03-03 VITALS — Ht 71.0 in | Wt 191.0 lb

## 2021-03-03 DIAGNOSIS — M1612 Unilateral primary osteoarthritis, left hip: Secondary | ICD-10-CM | POA: Diagnosis not present

## 2021-03-03 DIAGNOSIS — M25552 Pain in left hip: Secondary | ICD-10-CM

## 2021-03-03 NOTE — Progress Notes (Signed)
Office Visit Note   Patient: Leonard Rodriguez           Date of Birth: [birth year]           MRN: [id number] Visit Date: 03/03/2021              Requested by: Lawerance Cruel, [place],  [place] [address] PCP: Lawerance Cruel, MD   Assessment & Plan: Visit Diagnoses:  1. Pain in left hip   2. Unilateral primary osteoarthritis, left hip     Plan: The patient does have significant arthritis of his left hip that is likely slowly worsening.  He prefers to stay conservative with this treatment and I agree with this as well since its only been hurting him for several weeks.  I think the next step would be setting him up for an intra-articular steroid injection under radiographic guidance.  We will see if Dr. Ernestina Patches or Dr. Junius Roads can do this in the near future with a steroid injection in his left hip.  He agrees with this treatment plan.  Follow-up after that can be as needed and he knows to come see [place] if that does not work or things are worsening with his left hip.  All questions and concerns were answered and addressed.  Follow-Up Instructions: No follow-ups on file.   Orders:  Orders Placed This Encounter  Procedures   XR HIP UNILAT W OR W/O PELVIS 1V LEFT   No orders of the defined types were placed in this encounter.     Procedures: No procedures performed   Clinical Data: No additional findings.   Subjective: Chief Complaint  Patient presents with   Left Hip - Pain  The patient is a 75 year old gentleman who comes in with worsening left hip pain for several weeks now with no known injury.  He does report stiffness in the left hip and pain in the groin.  He has a history of a right total hip arthroplasty done through a posterior approach remotely.  He said some steps that he takes are difficult and he does take acetaminophen in the morning and that does help some.  He says bending over is painful as well as pain in the groin and the left hip in general.   He is modified on how he works out at the gym to try to offload that hip and decrease his pain.  He denies any other acute medical issues.  He cannot take traditional anti-inflammatories other than acetaminophen.  HPI  Review of Systems He currently denies any headache, chest pain, shortness of breath, fever, chills, nausea, vomiting  Objective: Vital Signs: Ht 5\' 11"  (1.803 m)   Wt 191 lb (86.6 kg)   BMI 26.64 kg/m   Physical Exam He is alert and orient x3 and in no acute distress Ortho Exam Examination of his left hip does show stiffness with internal and external rotation as well as pain in the groin with rotation. Specialty Comments:  No specialty comments available.  Imaging: XR HIP UNILAT W OR W/O PELVIS 1V LEFT  Result Date: 03/03/2021 An AP pelvis and lateral left hip shows a well-seated total hip arthroplasty on the right side.  The left hip does have moderate osteoarthritis with joint space narrowing and particular osteophytes.  There is also some sclerotic changes.    PMFS History: Patient Active Problem List   Diagnosis Date Noted   Unilateral primary osteoarthritis, left hip 03/03/2021   History of  non-ST elevation myocardial infarction (NSTEMI) 11/21/2014   CAD (coronary artery disease) 11/13/2014   HTN (hypertension) 11/13/2014   Hyperlipidemia 11/13/2014   STEMI (ST elevation myocardial infarction) ([place]) 11/12/2014   History of hypertension 11/12/2014   NSTEMI (non-ST elevated myocardial infarction) ([place]) 11/12/2014   Postoperative anemia due to acute blood loss 09/01/2011   Osteoarthritis of hip 08/30/2011   Past Medical History:  Diagnosis Date   Coronary artery disease    a. 11/2014 NSTEMI/PCI: LM nl, LAD min irregs, D1 30-40, LCX 30-11m, 100d (3.5x18 Resolute DES), OM1/2 nl, RCA 50p, R->L collats, EF 55%, mild MR.   Hyperlipidemia    Hypertension    put on bp meds. when he was heavier and was not exercising   Ischemic cardiomyopathy    Echo 3/16:   EF 45-50%, inf-lat and inf HK, Gr 2 DD, mod MR, mild LAE   Mitral regurgitation    Osteoarthritis of right hip    Recurrent upper respiratory infection (URI)    current cold being treated with otc meds.    Family History  Problem Relation Age of Onset   Lung cancer Father    Heart attack Neg Hx     Past Surgical History:  Procedure Laterality Date   CORONARY ANGIOPLASTY WITH STENT PLACEMENT  11/12/2014   "1"   JOINT REPLACEMENT     LEFT HEART CATHETERIZATION WITH CORONARY ANGIOGRAM N/A 11/12/2014   Procedure: LEFT HEART CATHETERIZATION WITH CORONARY ANGIOGRAM;  Surgeon: Blane Ohara, MD;  [facility]: [facility] CATH LAB;  Service: Cardiovascular;  Laterality: N/A;   TONSILLECTOMY  1950's   TOTAL HIP ARTHROPLASTY  08/30/2011   Procedure: TOTAL HIP ARTHROPLASTY;  Surgeon: Garald Balding, MD;  Location: [place];  Service: Orthopedics;  Laterality: Right;   VASECTOMY     Social History   Occupational History   Not on file  Tobacco Use   Smoking status: Never   Smokeless tobacco: Never  Vaping Use   Vaping Use: Never used  Substance and Sexual Activity   Alcohol use: Yes    Comment: 11/12/2014 "beer maybe twice/month"   Drug use: No   Sexual activity: Not Currently

## 2021-03-05 ENCOUNTER — Other Ambulatory Visit: Payer: Self-pay

## 2021-03-08 ENCOUNTER — Ambulatory Visit (INDEPENDENT_AMBULATORY_CARE_PROVIDER_SITE_OTHER): Payer: Medicare HMO | Admitting: Family Medicine

## 2021-03-08 ENCOUNTER — Ambulatory Visit: Payer: Self-pay

## 2021-03-08 ENCOUNTER — Encounter: Payer: Self-pay | Admitting: Family Medicine

## 2021-03-08 ENCOUNTER — Other Ambulatory Visit: Payer: Self-pay

## 2021-03-08 VITALS — Ht 71.0 in | Wt 191.0 lb

## 2021-03-08 DIAGNOSIS — M25552 Pain in left hip: Secondary | ICD-10-CM | POA: Diagnosis not present

## 2021-03-08 NOTE — Progress Notes (Signed)
Subjective: Patient is here for ultrasound-guided intra-articular Left hip injection.   Pain from DJD.  Objective:  Pain with IR.  Walks with a limp.  Procedure: Ultrasound guided injection is preferred based studies that show increased duration, increased effect, greater accuracy, decreased procedural pain, increased response rate, and decreased cost with ultrasound guided versus blind injection.   Verbal informed consent obtained.  Time-out conducted.  Noted no overlying erythema, induration, or other signs of local infection. Ultrasound-guided left hip injection: After sterile prep with Betadine, injected 4 cc 0.25% bupivacaine without epinephrine and 6 mg betamethasone using a 22-gauge spinal needle, passing the needle through the iliofemoral ligament into the femoral head/neck junction.  Injectate seen filling joint capsule.

## 2021-04-21 DIAGNOSIS — I2510 Atherosclerotic heart disease of native coronary artery without angina pectoris: Secondary | ICD-10-CM | POA: Diagnosis not present

## 2021-04-21 DIAGNOSIS — I10 Essential (primary) hypertension: Secondary | ICD-10-CM | POA: Diagnosis not present

## 2021-04-21 DIAGNOSIS — E7800 Pure hypercholesterolemia, unspecified: Secondary | ICD-10-CM | POA: Diagnosis not present

## 2021-05-04 ENCOUNTER — Ambulatory Visit: Payer: Medicare HMO | Admitting: Orthopaedic Surgery

## 2021-05-04 DIAGNOSIS — M1612 Unilateral primary osteoarthritis, left hip: Secondary | ICD-10-CM | POA: Diagnosis not present

## 2021-05-04 DIAGNOSIS — M25552 Pain in left hip: Secondary | ICD-10-CM

## 2021-05-04 NOTE — Progress Notes (Signed)
Office Visit Note   Patient: Leonard Rodriguez           Date of Birth: [birth year]           MRN: [id number] Visit Date: 05/04/2021              Requested by: Lawerance Cruel, [place],  [place] [address] PCP: Lawerance Cruel, MD   Assessment & Plan: Visit Diagnoses:  1. Pain in left hip   2. Unilateral primary osteoarthritis, left hip     Plan: Given the failure of conservative treatment for his left hip osteoarthritis he does wish to proceed with total hip arthroplasty surgery and I agree with this as well.  We had a thorough discussion about the risks and benefits of surgery and what to expect from an intraoperative and postoperative course.  I gave him a handout about hip replacement surgery as well.  We will work on getting this scheduled sometime in later October.  All questions and concerns were answered and addressed.  Follow-Up Instructions: Return for 2 weeks post-op.   Orders:  No orders of the defined types were placed in this encounter.  No orders of the defined types were placed in this encounter.     Procedures: No procedures performed   Clinical Data: No additional findings.   Subjective: Chief Complaint  Patient presents with   Left Hip - Pain  The patient is being seen in follow-up as it relates to severe arthritis of his left hip.  He is a very active 75 year old gentleman.  He has a history of a right total hip arthroplasty done years ago through a posterior approach.  That hip is done very well.  His left hip pain is daily and at this point is definitely affecting his mobility, his quality of life and his actives daily living.  He has had a recent intra-articular steroid injection in the left hip joint under ultrasound just in June.  That only helped for a few weeks.  At this point he is interested in hip replacement surgery and I agree with this.  He has tried and failed conservative treatment for well over a year including  activity modification, anti-inflammatories, steroid injections and exercises.  He is a thin individual as well.  He has had no other acute change in medical status.  HPI  Review of Systems There is currently listed no headache, chest pain, shortness of breath, fever, chills, nausea, vomiting  Objective: Vital Signs: There were no vitals taken for this visit.  Physical Exam He is alert and orient x3 and in no acute distress Ortho Exam Examination of his left hip shows significant stiffness with internal and external rotation as well as severe pain with rotation. Specialty Comments:  No specialty comments available.  Imaging: No results found. Previous x-rays of the left hip show significant joint space narrowing with particular osteophytes and sclerotic changes consistent with osteoarthritis.  PMFS History: Patient Active Problem List   Diagnosis Date Noted   Unilateral primary osteoarthritis, left hip 03/03/2021   History of non-ST elevation myocardial infarction (NSTEMI) 11/21/2014   CAD (coronary artery disease) 11/13/2014   HTN (hypertension) 11/13/2014   Hyperlipidemia 11/13/2014   STEMI (ST elevation myocardial infarction) ([place]) 11/12/2014   History of hypertension 11/12/2014   NSTEMI (non-ST elevated myocardial infarction) ([place]) 11/12/2014   Postoperative anemia due to acute blood loss 09/01/2011   Osteoarthritis of hip 08/30/2011   Past Medical History:  Diagnosis  Date   Coronary artery disease    a. 11/2014 NSTEMI/PCI: LM nl, LAD min irregs, D1 30-40, LCX 30-104m 100d (3.5x18 Resolute DES), OM1/2 nl, RCA 50p, R->L collats, EF 55%, mild MR.   Hyperlipidemia    Hypertension    put on bp meds. when he was heavier and was not exercising   Ischemic cardiomyopathy    Echo 3/16:  EF 45-50%, inf-lat and inf HK, Gr 2 DD, mod MR, mild LAE   Mitral regurgitation    Osteoarthritis of right hip    Recurrent upper respiratory infection (URI)    current cold being treated with  otc meds.    Family History  Problem Relation Age of Onset   Lung cancer Father    Heart attack Neg Hx     Past Surgical History:  Procedure Laterality Date   CORONARY ANGIOPLASTY WITH STENT PLACEMENT  11/12/2014   "1"   JOINT REPLACEMENT     LEFT HEART CATHETERIZATION WITH CORONARY ANGIOGRAM N/A 11/12/2014   Procedure: LEFT HEART CATHETERIZATION WITH CORONARY ANGIOGRAM;  Surgeon: MBlane Ohara MD;  [facility];  Service: Cardiovascular;  Laterality: N/A;   TONSILLECTOMY  1950's   TOTAL HIP ARTHROPLASTY  08/30/2011   Procedure: TOTAL HIP ARTHROPLASTY;  Surgeon: PGarald Balding MD;  [place]  Service: Orthopedics;  Laterality: Right;   VASECTOMY     Social History   Occupational History   Not on file  Tobacco Use   Smoking status: Never   Smokeless tobacco: Never  Vaping Use   Vaping Use: Never used  Substance and Sexual Activity   Alcohol use: Yes    Comment: 11/12/2014 "beer maybe twice/month"   Drug use: No   Sexual activity: Not Currently

## 2021-05-13 ENCOUNTER — Other Ambulatory Visit: Payer: Self-pay | Admitting: Cardiovascular Disease

## 2021-05-20 ENCOUNTER — Telehealth: Payer: Self-pay | Admitting: Orthopaedic Surgery

## 2021-05-20 NOTE — Telephone Encounter (Signed)
See message.

## 2021-05-20 NOTE — Telephone Encounter (Signed)
Py would like to know if he could donate blood on sept 15?  CB [phone]

## 2021-05-21 NOTE — Telephone Encounter (Signed)
Called pt and advised. He stated understanding  °

## 2021-05-24 ENCOUNTER — Other Ambulatory Visit: Payer: Self-pay

## 2021-06-08 ENCOUNTER — Encounter: Payer: Self-pay | Admitting: Cardiovascular Disease

## 2021-06-08 ENCOUNTER — Other Ambulatory Visit: Payer: Self-pay

## 2021-06-08 ENCOUNTER — Ambulatory Visit: Payer: Medicare HMO | Admitting: Cardiovascular Disease

## 2021-06-08 VITALS — BP 130/72 | HR 53 | Ht 71.0 in | Wt 195.8 lb

## 2021-06-08 DIAGNOSIS — E782 Mixed hyperlipidemia: Secondary | ICD-10-CM | POA: Diagnosis not present

## 2021-06-08 DIAGNOSIS — I10 Essential (primary) hypertension: Secondary | ICD-10-CM

## 2021-06-08 DIAGNOSIS — I2510 Atherosclerotic heart disease of native coronary artery without angina pectoris: Secondary | ICD-10-CM

## 2021-06-08 DIAGNOSIS — I340 Nonrheumatic mitral (valve) insufficiency: Secondary | ICD-10-CM | POA: Diagnosis not present

## 2021-06-08 LAB — LIPID PANEL
Chol/HDL Ratio: 4.3 ratio (ref 0.0–5.0)
Cholesterol, Total: 136 mg/dL (ref 100–199)
HDL: 32 mg/dL — ABNORMAL LOW (ref >39)
LDL Chol Calc (NIH): 70 mg/dL (ref 0–99)
Triglycerides: 206 mg/dL — ABNORMAL HIGH (ref 0–149)
VLDL Cholesterol Cal: 34 mg/dL (ref 5–40)

## 2021-06-08 LAB — COMPREHENSIVE METABOLIC PANEL
ALT: 31 IU/L (ref 0–44)
AST: 36 IU/L (ref 0–40)
Albumin/Globulin Ratio: 1.6 (ref 1.2–2.2)
Albumin: 4.1 g/dL (ref 3.7–4.7)
Alkaline Phosphatase: 79 IU/L (ref 44–121)
BUN/Creatinine Ratio: 15 (ref 10–24)
BUN: 16 mg/dL (ref 8–27)
Bilirubin Total: 0.5 mg/dL (ref 0.0–1.2)
CO2: 26 mmol/L (ref 20–29)
Calcium: 9.2 mg/dL (ref 8.6–10.2)
Chloride: 101 mmol/L (ref 96–106)
Creatinine, Ser: 1.05 mg/dL (ref 0.76–1.27)
Globulin, Total: 2.6 g/dL (ref 1.5–4.5)
Glucose: 107 mg/dL — ABNORMAL HIGH (ref 70–99)
Potassium: 4.3 mmol/L (ref 3.5–5.2)
Sodium: 140 mmol/L (ref 134–144)
Total Protein: 6.7 g/dL (ref 6.0–8.5)
eGFR: 74 mL/min/{1.73_m2} (ref >59)

## 2021-06-08 LAB — CBC
Hematocrit: 43.3 % (ref 37.5–51.0)
Hemoglobin: 14.5 g/dL (ref 13.0–17.7)
MCH: 31.8 pg (ref 26.6–33.0)
MCHC: 33.5 g/dL (ref 31.5–35.7)
MCV: 95 fL (ref 79–97)
Platelets: 205 10*3/uL (ref 150–450)
RBC: 4.56 x10E6/uL (ref 4.14–5.80)
RDW: 12.7 % (ref 11.6–15.4)
WBC: 7.7 10*3/uL (ref 3.4–10.8)

## 2021-06-08 LAB — TSH: TSH: 1.9 u[IU]/mL (ref 0.450–4.500)

## 2021-06-08 NOTE — Progress Notes (Signed)
Cardiology Office Note:    Date:  06/08/2021   ID:  Leonard Rodriguez, DOB 1946/07/19, MRN [id number]  PCP:  Leonard Cruel, MD   [facility] Providers Cardiologist:  Leonard Mocha, MD     Referring MD: Leonard Cruel, MD   Chief Complaint  Patient presents with   Coronary Artery Disease     History of Present Illness:    Leonard Rodriguez is a 75 y.o. male with a hx of coronary artery disease, presenting for follow-up evaluation. The patient initially presented in 2016 with a non-STEMI and was found to have total occlusion of the left circumflex treated with a drug-eluting stent.  He otherwise had nonobstructive disease and was noted to have mildly reduced LV systolic function with an LVEF of 50 to 55%.  He is here alone today. He continues to walk on a regular basis. He walked 7 miles this morning. Today, he denies symptoms of palpitations, chest pain, shortness of breath, orthopnea, PND, lower extremity edema, dizziness, or syncope. He is going to have left hip replacement in the near future to treat chronic, aggravating pain.    Past Medical History:  Diagnosis Date   Coronary artery disease    a. 11/2014 NSTEMI/PCI: LM nl, LAD min irregs, D1 30-40, LCX 30-71m 100d (3.5x18 Resolute DES), OM1/2 nl, RCA 50p, R->L collats, EF 55%, mild MR.   Hyperlipidemia    Hypertension    put on bp meds. when he was heavier and was not exercising   Ischemic cardiomyopathy    Echo 3/16:  EF 45-50%, inf-lat and inf HK, Gr 2 DD, mod MR, mild LAE   Mitral regurgitation    Osteoarthritis of right hip    Recurrent upper respiratory infection (URI)    current cold being treated with otc meds.    Past Surgical History:  Procedure Laterality Date   CORONARY ANGIOPLASTY WITH STENT PLACEMENT  11/12/2014   "1"   JOINT REPLACEMENT     LEFT HEART CATHETERIZATION WITH CORONARY ANGIOGRAM N/A 11/12/2014   Procedure: LEFT HEART CATHETERIZATION WITH CORONARY ANGIOGRAM;  Surgeon: MBlane Ohara MD;   [facility];  Service: Cardiovascular;  Laterality: N/A;   TONSILLECTOMY  1950's   TOTAL HIP ARTHROPLASTY  08/30/2011   Procedure: TOTAL HIP ARTHROPLASTY;  Surgeon: PGarald Balding MD;  [place]  Service: Orthopedics;  Laterality: Right;   VASECTOMY      Current Medications: Current Meds  Medication Sig   Ascorbic Acid (VITAMIN C) 1000 MG tablet Take 1,000 mg by mouth daily.   aspirin 81 MG chewable tablet Chew 1 tablet (81 mg total) by mouth daily.   atorvastatin (LIPITOR) 80 MG tablet TAKE ONE TABLET BY MOUTH DAILY AT 6:00 IN THE EVENING   benazepril-hydrochlorthiazide (LOTENSIN HCT) 20-25 MG per tablet Take 1 tablet by mouth daily.   metoprolol tartrate (LOPRESSOR) 25 MG tablet TAKE ONE TABLET BY MOUTH TWICE A DAY   Multiple Vitamins-Minerals (MULTIVITAMIN WITH MINERALS) tablet Take 1 tablet by mouth daily.    nitroGLYCERIN (NITROSTAT) 0.4 MG SL tablet Place 1 tablet (0.4 mg total) under the tongue every 5 (five) minutes x 3 doses as needed for chest pain.   Omega-3 Fatty Acids (FISH OIL) 1200 MG CAPS Take 2,400 mg by mouth daily.     Allergies:   Patient has no known allergies.   Social History   Socioeconomic History   Marital status: Married    Spouse name: Not on file  Number of children: Not on file   Years of education: Not on file   Highest education level: Not on file  Occupational History   Not on file  Tobacco Use   Smoking status: Never   Smokeless tobacco: Never  Vaping Use   Vaping Use: Never used  Substance and Sexual Activity   Alcohol use: Yes    Comment: 11/12/2014 "beer maybe twice/month"   Drug use: No   Sexual activity: Not Currently  Other Topics Concern   Not on file  Social History Narrative   Not on file   Social Determinants of Health   Financial Resource Strain: Not on file  Food Insecurity: Not on file  Transportation Needs: Not on file  Physical Activity: Not on file  Stress: Not on file  Social Connections: Not  on file     Family History: The patient's family history includes Lung cancer in his father. There is no history of Heart attack.  ROS:   Please see the history of present illness.    All other systems reviewed and are negative.  EKGs/Labs/Other Studies Reviewed:    The following studies were reviewed today: Cardiac catheterization 11/12/2014: PROCEDURAL FINDINGS Hemodynamics: AO 142/64 LV 139/14              Coronary angiography: Coronary dominance: right   Left mainstem: The left main is patent with no obstructive disease. The vessel divides into the LAD and left circumflex.   Left anterior descending (LAD): The LAD has diffuse irregularity without significant stenosis. Proximal LAD is widely patent. The mid LAD after the first diagonal has diffuse irregularities. The first diagonal has 30-40% stenosis. The mid and distal LAD are widely patent without stenosis.   Left circumflex (LCx): The left circumflex is patent proximally. The first obtuse marginal branches 1 and 2 are patent. There are mild ostial stenoses of about 30-40%. The distal AV circumflex beyond the second obtuse marginal branch is totally occluded with faint filling of the third OM branch.   Right coronary artery (RCA): The RCA has a high, anterior origin. The proximal vessel has 50% stenosis. The mid vessel is patent. The acute marginal branch is patent. There is a small PDA branch with no significant stenosis. There is a faint collateral to the left circumflex present.   Left ventriculography: The basal inferior wall is severely hypokinetic. The other LV wall segments contract normally. The LVEF is estimated at 55%. There is mild mitral regurgitation present.   PCI Note:  Following the diagnostic procedure, the decision was made to proceed with PCI of the left circumflex. The vessel is totally occluded with angiographic findings consistent with an acute occlusion. The patient was loaded with brilinta 180 mg on the  table. Weight-based bivalirudin was given for anticoagulation. Once a therapeutic ACT was achieved, a 6 [place] XB 3.5 cm guide catheter was inserted.  A cougar guidewire was initially attempted but could not be advanced across the lesion. A whisper coronary guidewire was used to cross the lesion.  The lesion was predilated with a 2.5 mm balloon.  The lesion was then stented with a 3.5 x 18 mm resolute DES.  After stenting there was mild no-reflow. Intracoronary verapamil was administered. The stent appear well expanded. The flow returned to normal. I felt that postdilatation would likely exacerbate no-reflow. There was a distal occlusion of the AV groove circumflex and a separate branch. I redirected the wire across that occlusion. The balloon was advanced and dilated. However, flow  was not restored. The vessel appeared very small and I did not think that further efforts were worthwhile.  Following PCI, there was 0% residual stenosis and TIMI-3 flow. Final angiography confirmed an excellent result. The patient tolerated the procedure well. There were no immediate procedural complications. A TR band was used for radial hemostasis. The patient was transferred to the post catheterization recovery area for further monitoring.   PCI Data: Vessel - LCx/Segment - distal Percent Stenosis (pre)  100 TIMI-flow 0 Stent 3.5x18 mm DES Percent Stenosis (post) 0 TIMI-flow (post) 3   Estimated Blood Loss: minimal   Final Conclusions:   1. Single-vessel coronary artery disease with total occlusion of the distal left circumflex, treated successfully with PCI using a drug-eluting stent 2. Diffuse nonobstructive stenosis of the RCA and LAD 3. Mild contraction abnormality left ventricle with hypokinesis of the basal inferior wall and preserved overall LVEF    Recommendations:  Post MI medical therapy. Suspect the patient can be discharged tomorrow as long as no complications occurred.  2D echocardiogram  11/26/2014: Study Conclusions   - Left ventricle: The cavity size was normal. Systolic function was    mildly reduced. The estimated ejection fraction was in the range    of 45% to 50%. There is hypokinesis of the basal-midinferolateral    and inferior myocardium. Features are consistent with a    pseudonormal left ventricular filling pattern, with concomitant    abnormal relaxation and increased filling pressure (grade 2    diastolic dysfunction). No evidence of thrombus.  - Mitral valve: There was moderate regurgitation.  - Left atrium: The atrium was mildly dilated.   EKG:  EKG is ordered today.  The ekg ordered today demonstrates sinus bradycardia 53 bpm, age-indeterminate inferior infarct  Recent Labs: No results found for requested labs within last 8760 hours.  Recent Lipid Panel    Component Value Date/Time   CHOL 137 05/28/2020 0742   TRIG 144 05/28/2020 0742   HDL 45 05/28/2020 0742   CHOLHDL 3.0 05/28/2020 0742   CHOLHDL 3 01/14/2015 0922   VLDL 25.0 01/14/2015 0922   LDLCALC 67 05/28/2020 0742     Risk Assessment/Calculations:     Physical Exam:    VS:  BP 130/72   Pulse (!) 53   Ht 5' 11"  (1.803 m)   Wt 195 lb 12.8 oz (88.8 kg)   SpO2 95%   BMI 27.31 kg/m     Wt Readings from Last 3 Encounters:  06/08/21 195 lb 12.8 oz (88.8 kg)  03/08/21 191 lb (86.6 kg)  03/03/21 191 lb (86.6 kg)     GEN:  Well nourished, well developed in no acute distress HEENT: Normal NECK: No JVD; No carotid bruits LYMPHATICS: No lymphadenopathy CARDIAC: RRR, no murmurs, rubs, gallops RESPIRATORY:  Clear to auscultation without rales, wheezing or rhonchi  ABDOMEN: Soft, non-tender, non-distended MUSCULOSKELETAL:  No edema; No deformity  SKIN: Warm and dry NEUROLOGIC:  Alert and oriented x 3 PSYCHIATRIC:  Normal affect   ASSESSMENT:    1. Coronary artery disease involving native coronary artery of native heart without angina pectoris   2. Mixed hyperlipidemia   3.  Essential hypertension   4. Mitral valve insufficiency, unspecified etiology    PLAN:    In order of problems listed above:  The patient is stable without symptoms of angina.  He will continue on aspirin for antiplatelet therapy, high intensity statin drug, and a beta-blocker.  He is having no symptoms of chest discomfort or  breathlessness. Lipids have been at goal.  Last LDL cholesterol 67 mg/dL.  Treated with atorvastatin 80 mg daily.  Update lipids and LFTs today. Blood pressure is well controlled on a combination of benazepril, hydrochlorothiazide, and metoprolol tartrate.  We will update a metabolic panel today to include electrolytes and kidney function. Patient noted to have moderate mitral valve regurgitation on old echo around the time of his heart attack.  I do not appreciate mitral regurgitation on his exam.  He also had mild LV systolic dysfunction with LVEF 45 to 50%.  I have recommended an updated echo to reassess.   Medication Adjustments/Labs and Tests Ordered: Current medicines are reviewed at length with the patient today.  Concerns regarding medicines are outlined above.  Orders Placed This Encounter  Procedures   Comp Met (CMET)   CBC   TSH   Lipid Profile   EKG 12-Lead   ECHOCARDIOGRAM COMPLETE    No orders of the defined types were placed in this encounter.   Patient Instructions  Medication Instructions:   Your physician recommends that you continue on your current medications as directed. Please refer to the Current Medication list given to you today.  *If you need a refill on your cardiac medications before your next appointment, please call your pharmacy*   Lab Work:  TODAY--CMET, CBC, TSH, AND LIPIDS  If you have labs (blood work) drawn today and your tests are completely normal, you will receive your results only by: [place] (if you have MyChart) OR A paper copy in the mail If you have any lab test that is abnormal or we need to change  your treatment, we will call you to review the results.   Testing/Procedures:  Your physician has requested that you have an echocardiogram. Echocardiography is a painless test that uses sound waves to create images of your heart. It provides your doctor with information about the size and shape of your heart and how well your heart's chambers and valves are working. This procedure takes approximately one hour. There are no restrictions for this procedure.    Follow-Up: At [facility], you and your health needs are our priority.  As part of our continuing mission to provide you with exceptional heart care, we have created designated Provider Care Teams.  These Care Teams include your primary Cardiologist (physician) and Advanced Practice Providers (APPs -  Physician Assistants and Nurse Practitioners) who all work together to provide you with the care you need, when you need it.  We recommend signing up for the patient portal called "MyChart".  Sign up information is provided on this After Visit Summary.  MyChart is used to connect with patients for Virtual Visits (Telemedicine).  Patients are able to view lab/test results, encounter notes, upcoming appointments, etc.  Non-urgent messages can be sent to your provider as well.   To learn more about what you can do with MyChart, go to [email or web address].    Your next appointment:   1 year(s)  The format for your next appointment:   In Person  Provider:   Sherren Mocha, MD      Signed, Leonard Mocha, MD  06/08/2021 1:06 PM    [place]

## 2021-06-08 NOTE — Patient Instructions (Signed)
Medication Instructions:   Your physician recommends that you continue on your current medications as directed. Please refer to the Current Medication list given to you today.  *If you need a refill on your cardiac medications before your next appointment, please call your pharmacy*   Lab Work:  TODAY--CMET, CBC, TSH, AND LIPIDS  If you have labs (blood work) drawn today and your tests are completely normal, you will receive your results only by: [place] (if you have MyChart) OR A paper copy in the mail If you have any lab test that is abnormal or we need to change your treatment, we will call you to review the results.   Testing/Procedures:  Your physician has requested that you have an echocardiogram. Echocardiography is a painless test that uses sound waves to create images of your heart. It provides your doctor with information about the size and shape of your heart and how well your heart's chambers and valves are working. This procedure takes approximately one hour. There are no restrictions for this procedure.    Follow-Up: At [facility], you and your health needs are our priority.  As part of our continuing mission to provide you with exceptional heart care, we have created designated Provider Care Teams.  These Care Teams include your primary Cardiologist (physician) and Advanced Practice Providers (APPs -  Physician Assistants and Nurse Practitioners) who all work together to provide you with the care you need, when you need it.  We recommend signing up for the patient portal called "MyChart".  Sign up information is provided on this After Visit Summary.  MyChart is used to connect with patients for Virtual Visits (Telemedicine).  Patients are able to view lab/test results, encounter notes, upcoming appointments, etc.  Non-urgent messages can be sent to your provider as well.   To learn more about what you can do with MyChart, go to [email or web address].    Your  next appointment:   1 year(s)  The format for your next appointment:   In Person  Provider:   Sherren Mocha, MD

## 2021-06-23 NOTE — Progress Notes (Signed)
Please enter orders for surgery scheduled for 07-09-21

## 2021-06-24 ENCOUNTER — Ambulatory Visit (HOSPITAL_COMMUNITY): Payer: Medicare HMO | Attending: Cardiology

## 2021-06-24 ENCOUNTER — Other Ambulatory Visit: Payer: Self-pay

## 2021-06-24 ENCOUNTER — Other Ambulatory Visit: Payer: Self-pay | Admitting: Physician Assistant

## 2021-06-24 DIAGNOSIS — I340 Nonrheumatic mitral (valve) insufficiency: Secondary | ICD-10-CM | POA: Insufficient documentation

## 2021-06-24 DIAGNOSIS — M1612 Unilateral primary osteoarthritis, left hip: Secondary | ICD-10-CM

## 2021-06-24 LAB — ECHOCARDIOGRAM COMPLETE
Area-P 1/2: 3.99 cm2
S' Lateral: 3.3 cm

## 2021-06-24 MED ORDER — PERFLUTREN LIPID MICROSPHERE
1.0000 mL | INTRAVENOUS | Status: AC | PRN
  Administered 2021-06-24: 2 mL via INTRAVENOUS

## 2021-06-28 NOTE — Patient Instructions (Addendum)
DUE TO COVID-19 ONLY ONE VISITOR IS ALLOWED TO COME WITH YOU AND STAY IN THE WAITING ROOM ONLY DURING PRE OP AND PROCEDURE.   **NO VISITORS ARE ALLOWED IN THE SHORT STAY AREA OR RECOVERY ROOM!!**  IF YOU WILL BE ADMITTED INTO THE HOSPITAL YOU ARE ALLOWED ONLY TWO SUPPORT PEOPLE DURING VISITATION HOURS ONLY (10AM -8PM)   The support person(s) may change daily. The support person(s) must pass our screening, gel in and out, and wear a mask at all times, including in the patient's room. Patients must also wear a mask when staff or their support person are in the room.  No visitors under the age of 56. Any visitor under the age of 54 must be accompanied by an adult.    COVID SWAB TESTING MUST BE COMPLETED ON:  07/07/21 **MUST PRESENT COMPLETED FORM AT TESTING SITE**    [place] (backside of the building) Open 8am-3pm. No appointment needed You are not required to quarantine, however you are required to wear a well-fitted mask when you are out and around people not in your household.  Hand Hygiene often Do NOT share personal items Notify your provider if you are in close contact with someone who has COVID or you develop fever 100.4 or greater, new onset of sneezing, cough, sore throat, shortness of breath or body aches.       Your procedure is scheduled on: 07/09/21   Report to [facility] Main Entrance   Report to Short Stay at 5:45 AM    Call this number if you have problems the morning of surgery [phone]   Do not eat food :After Midnight.   May have liquids until 5:30 AM day of surgery  CLEAR LIQUID DIET  Foods Allowed                                                                     Foods Excluded  Water, Black Coffee and tea (no milk or creamer)          liquids that you cannot  Plain Jell-O in any flavor  (No red)                                   see through such as: Fruit ices (not with fruit pulp)                                           milk, soups, orange juice              Iced Popsicles (No red)                                              All solid food                                   Apple juices Sports drinks  like Gatorade (No red) Lightly seasoned clear broth or consume(fat free) Sugar      The day of surgery:  Drink ONE (1) Pre-Surgery Clear Ensure by 5:30 am the morning of surgery. Drink in one sitting. Do not sip.  This drink was given to you during your hospital  pre-op appointment visit. Nothing else to drink after completing the  Pre-Surgery Clear Ensure.          If you have questions, please contact your surgeon's office.     Oral Hygiene is also important to reduce your risk of infection.                                    Remember - BRUSH YOUR TEETH THE MORNING OF SURGERY WITH YOUR REGULAR TOOTHPASTE   Take these medicines the morning of surgery with A SIP OF WATER: Metoprolol                              You may not have any metal on your body including jewelry, and body piercing             Do not wear lotions, powders, cologne, or deodorant              Men may shave face and neck.   Do not bring valuables to the hospital. [place].   Contacts, dentures or bridgework may not be worn into surgery.   Bring small overnight bag day of surgery.   Special Instructions: Bring a copy of your healthcare power of attorney and living will documents         the day of surgery if you haven't scanned them before.   Please read over the following fact sheets you were given: IF YOU HAVE QUESTIONS ABOUT YOUR PRE-OP INSTRUCTIONS PLEASE CALL [place] - Preparing for Surgery Before surgery, you can play an important role.  Because skin is not sterile, your skin needs to be as free of germs as possible.  You can reduce the number of germs on your skin by washing with CHG (chlorahexidine gluconate) soap before surgery.  CHG is an  antiseptic cleaner which kills germs and bonds with the skin to continue killing germs even after washing. Please DO NOT use if you have an allergy to CHG or antibacterial soaps.  If your skin becomes reddened/irritated stop using the CHG and inform your nurse when you arrive at Short Stay. Do not shave (including legs and underarms) for at least 48 hours prior to the first CHG shower.  You may shave your face/neck.  Please follow these instructions carefully:  1.  Shower with CHG Soap the night before surgery and the  morning of surgery.  2.  If you choose to wash your hair, wash your hair first as usual with your normal  shampoo.  3.  After you shampoo, rinse your hair and body thoroughly to remove the shampoo.                             4.  Use CHG as you would any other liquid soap.  You can apply chg directly to the skin and wash.  Gently  with a scrungie or clean washcloth.  5.  Apply the CHG Soap to your body ONLY FROM THE NECK DOWN.   Do   not use on face/ open                           Wound or open sores. Avoid contact with eyes, ears mouth and   genitals (private parts).                       Wash face,  Genitals (private parts) with your normal soap.             6.  Wash thoroughly, paying special attention to the area where your    surgery  will be performed.  7.  Thoroughly rinse your body with warm water from the neck down.  8.  DO NOT shower/wash with your normal soap after using and rinsing off the CHG Soap.                9.  Pat yourself dry with a clean towel.            10.  Wear clean pajamas.            11.  Place clean sheets on your bed the night of your first shower and do not  sleep with pets. Day of Surgery : Do not apply any lotions/deodorants the morning of surgery.  Please wear clean clothes to the hospital/surgery center.  FAILURE TO FOLLOW THESE INSTRUCTIONS MAY RESULT IN THE CANCELLATION OF YOUR SURGERY  PATIENT  SIGNATURE_________________________________  NURSE SIGNATURE__________________________________  ________________________________________________________________________   Leonard Rodriguez  An incentive spirometer is a tool that can help keep your lungs clear and active. This tool measures how well you are filling your lungs with each breath. Taking long deep breaths may help reverse or decrease the chance of developing breathing (pulmonary) problems (especially infection) following: A long period of time when you are unable to move or be active. BEFORE THE PROCEDURE  If the spirometer includes an indicator to show your best effort, your nurse or respiratory therapist will set it to a desired goal. If possible, sit up straight or lean slightly forward. Try not to slouch. Hold the incentive spirometer in an upright position. INSTRUCTIONS FOR USE  Sit on the edge of your bed if possible, or sit up as far as you can in bed or on a chair. Hold the incentive spirometer in an upright position. Breathe out normally. Place the mouthpiece in your mouth and seal your lips tightly around it. Breathe in slowly and as deeply as possible, raising the piston or the ball toward the top of the column. Hold your breath for 3-5 seconds or for as long as possible. Allow the piston or ball to fall to the bottom of the column. Remove the mouthpiece from your mouth and breathe out normally. Rest for a few seconds and repeat Steps 1 through 7 at least 10 times every 1-2 hours when you are awake. Take your time and take a few normal breaths between deep breaths. The spirometer may include an indicator to show your best effort. Use the indicator as a goal to work toward during each repetition. After each set of 10 deep breaths, practice coughing to be sure your lungs are clear. If you have an incision (the cut made at the time of surgery), support your incision when coughing by placing a  pillow or rolled up towels  firmly against it. Once you are able to get out of bed, walk around indoors and cough well. You may stop using the incentive spirometer when instructed by your caregiver.  RISKS AND COMPLICATIONS Take your time so you do not get dizzy or light-headed. If you are in pain, you may need to take or ask for pain medication before doing incentive spirometry. It is harder to take a deep breath if you are having pain. AFTER USE Rest and breathe slowly and easily. It can be helpful to keep track of a log of your progress. Your caregiver can provide you with a simple table to help with this. If you are using the spirometer at home, follow these instructions: [place] IF:  You are having difficultly using the spirometer. You have trouble using the spirometer as often as instructed. Your pain medication is not giving enough relief while using the spirometer. You develop fever of 100.5 F (38.1 C) or higher. SEEK IMMEDIATE MEDICAL CARE IF:  You cough up bloody sputum that had not been present before. You develop fever of 102 F (38.9 C) or greater. You develop worsening pain at or near the incision site. MAKE SURE YOU:  Understand these instructions. Will watch your condition. Will get help right away if you are not doing well or get worse. Document Released: 01/09/2007 Document Revised: 11/21/2011 Document Reviewed: 03/12/2007 Exit[employer] Patient Information 2014 [employer], [place].   ________________________________________________________________________  WHAT IS A BLOOD TRANSFUSION? Blood Transfusion Information  A transfusion is the replacement of blood or some of its parts. Blood is made up of multiple cells which provide different functions. Red blood cells carry oxygen and are used for blood loss replacement. White blood cells fight against infection. Platelets control bleeding. Plasma helps clot blood. Other blood products are available for specialized needs, such as hemophilia or  other clotting disorders. BEFORE THE TRANSFUSION  Who gives blood for transfusions?  Healthy volunteers who are fully evaluated to make sure their blood is safe. This is blood bank blood. Transfusion therapy is the safest it has ever been in the practice of medicine. Before blood is taken from a donor, a complete history is taken to make sure that person has no history of diseases nor engages in risky social behavior (examples are intravenous drug use or sexual activity with multiple partners). The donor's travel history is screened to minimize risk of transmitting infections, such as malaria. The donated blood is tested for signs of infectious diseases, such as HIV and hepatitis. The blood is then tested to be sure it is compatible with you in order to minimize the chance of a transfusion reaction. If you or a relative donates blood, this is often done in anticipation of surgery and is not appropriate for emergency situations. It takes many days to process the donated blood. RISKS AND COMPLICATIONS Although transfusion therapy is very safe and saves many lives, the main dangers of transfusion include:  Getting an infectious disease. Developing a transfusion reaction. This is an allergic reaction to something in the blood you were given. Every precaution is taken to prevent this. The decision to have a blood transfusion has been considered carefully by your caregiver before blood is given. Blood is not given unless the benefits outweigh the risks. AFTER THE TRANSFUSION Right after receiving a blood transfusion, you will usually feel much better and more energetic. This is especially true if your red blood cells have gotten low (anemic). The transfusion  raises the level of the red blood cells which carry oxygen, and this usually causes an energy increase. The nurse administering the transfusion will monitor you carefully for complications. HOME CARE INSTRUCTIONS  No special instructions are needed after  a transfusion. You may find your energy is better. Speak with your caregiver about any limitations on activity for underlying diseases you may have. SEEK MEDICAL CARE IF:  Your condition is not improving after your transfusion. You develop redness or irritation at the intravenous (IV) site. SEEK IMMEDIATE MEDICAL CARE IF:  Any of the following symptoms occur over the next 12 hours: Shaking chills. You have a temperature by mouth above 102 F (38.9 C), not controlled by medicine. Chest, back, or muscle pain. People around you feel you are not acting correctly or are confused. Shortness of breath or difficulty breathing. Dizziness and fainting. You get a rash or develop hives. You have a decrease in urine output. Your urine turns a dark color or changes to pink, red, or brown. Any of the following symptoms occur over the next 10 days: You have a temperature by mouth above 102 F (38.9 C), not controlled by medicine. Shortness of breath. Weakness after normal activity. The white part of the eye turns yellow (jaundice). You have a decrease in the amount of urine or are urinating less often. Your urine turns a dark color or changes to pink, red, or brown. Document Released: 08/26/2000 Document Revised: 11/21/2011 Document Reviewed: 04/14/2008 [facility] Patient Information 2014 [place], [place].  _______________________________________________________________________

## 2021-06-28 NOTE — Progress Notes (Addendum)
COVID swab appointment: 07/07/21  COVID Vaccine Completed: yes x3 Date COVID Vaccine completed: 11/03/19, 11/27/19 Has received booster: COVID vaccine manufacturer: [place]      Date of COVID positive in last 90 days: no  PCP - Juanda Crumble Ross,MD Cardiologist - Sherren Mocha, MD  Cards aware of upcoming surgery  Chest x-ray - n/a EKG - 06/08/21 Epic Stress Test - n/a ECHO - 06/24/21 Epic Cardiac Cath - 2016 Pacemaker/ICD device last checked: n/a Spinal Cord Stimulator: n/a  Sleep Study - n/a CPAP -   Fasting Blood Sugar - n/a Checks Blood Sugar _____ times a day  Blood Thinner Instructions: Aspirin Instructions: ASA 81, no instructions given Last Dose:  Activity level: Can go up a flight of stairs and perform activities of daily living without stopping and without symptoms of chest pain or shortness of breath.     Anesthesia review: HTN, CAD, NSTEMI  Patient denies shortness of breath, fever, cough and chest pain at PAT appointment   Patient verbalized understanding of instructions that were given to them at the PAT appointment. Patient was also instructed that they will need to review over the PAT instructions again at home before surgery.

## 2021-06-29 ENCOUNTER — Encounter (HOSPITAL_COMMUNITY): Payer: Self-pay

## 2021-06-29 ENCOUNTER — Encounter (HOSPITAL_COMMUNITY)
Admission: RE | Admit: 2021-06-29 | Discharge: 2021-06-29 | Disposition: A | Discharge status: Home / Self Care | Payer: Medicare HMO | Source: Ambulatory Visit | Attending: Orthopaedic Surgery | Admitting: Orthopaedic Surgery

## 2021-06-29 ENCOUNTER — Other Ambulatory Visit: Payer: Self-pay

## 2021-06-29 DIAGNOSIS — Z955 Presence of coronary angioplasty implant and graft: Secondary | ICD-10-CM | POA: Insufficient documentation

## 2021-06-29 DIAGNOSIS — I2510 Atherosclerotic heart disease of native coronary artery without angina pectoris: Secondary | ICD-10-CM | POA: Insufficient documentation

## 2021-06-29 DIAGNOSIS — Z23 Encounter for immunization: Secondary | ICD-10-CM | POA: Diagnosis not present

## 2021-06-29 DIAGNOSIS — M1612 Unilateral primary osteoarthritis, left hip: Secondary | ICD-10-CM | POA: Insufficient documentation

## 2021-06-29 DIAGNOSIS — I509 Heart failure, unspecified: Secondary | ICD-10-CM | POA: Insufficient documentation

## 2021-06-29 DIAGNOSIS — Z01812 Encounter for preprocedural laboratory examination: Secondary | ICD-10-CM | POA: Insufficient documentation

## 2021-06-29 DIAGNOSIS — L720 Epidermal cyst: Secondary | ICD-10-CM | POA: Diagnosis not present

## 2021-06-29 DIAGNOSIS — Z7982 Long term (current) use of aspirin: Secondary | ICD-10-CM | POA: Insufficient documentation

## 2021-06-29 DIAGNOSIS — I110 Hypertensive heart disease with heart failure: Secondary | ICD-10-CM | POA: Diagnosis not present

## 2021-06-29 DIAGNOSIS — Z79899 Other long term (current) drug therapy: Secondary | ICD-10-CM | POA: Diagnosis not present

## 2021-06-29 DIAGNOSIS — D1801 Hemangioma of skin and subcutaneous tissue: Secondary | ICD-10-CM | POA: Diagnosis not present

## 2021-06-29 DIAGNOSIS — L821 Other seborrheic keratosis: Secondary | ICD-10-CM | POA: Diagnosis not present

## 2021-06-29 LAB — BASIC METABOLIC PANEL
Anion gap: 7 (ref 5–15)
BUN: 18 mg/dL (ref 8–23)
CO2: 28 mmol/L (ref 22–32)
Calcium: 9.1 mg/dL (ref 8.9–10.3)
Chloride: 102 mmol/L (ref 98–111)
Creatinine, Ser: 0.95 mg/dL (ref 0.61–1.24)
GFR, Estimated: >60 mL/min (ref >60)
Glucose, Bld: 118 mg/dL — ABNORMAL HIGH (ref 70–99)
Potassium: 4 mmol/L (ref 3.5–5.1)
Sodium: 137 mmol/L (ref 135–145)

## 2021-06-29 LAB — CBC
HCT: 42.1 % (ref 39.0–52.0)
Hemoglobin: 14.1 g/dL (ref 13.0–17.0)
MCH: 31.8 pg (ref 26.0–34.0)
MCHC: 33.5 g/dL (ref 30.0–36.0)
MCV: 94.8 fL (ref 80.0–100.0)
Platelets: 183 10*3/uL (ref 150–400)
RBC: 4.44 MIL/uL (ref 4.22–5.81)
RDW: 12.4 % (ref 11.5–15.5)
WBC: 9.5 10*3/uL (ref 4.0–10.5)
nRBC: 0 % (ref 0.0–0.2)

## 2021-06-29 LAB — SURGICAL PCR SCREEN
MRSA, PCR: NEGATIVE
Staphylococcus aureus: POSITIVE — AB

## 2021-06-30 NOTE — Progress Notes (Signed)
Anesthesia Chart Review   Case: [id number] Date/Time: 07/09/21 0815   Procedure: LEFT TOTAL HIP ARTHROPLASTY ANTERIOR APPROACH (Left: Hip)   Anesthesia type: Spinal   Pre-op diagnosis: osteoarthritis left hip   Location: [facility] ROOM 10 / [facility]   Surgeons: Mcarthur Rossetti, MD       DISCUSSION:75 y.o. never smoker with h/o HTN, CAD (DES 2016), CHF, left hip OA scheduled for above procedure 07/09/2021 with Dr. Jean Rosenthal.   Pt last seen by cardiology 06/08/2021. Per OV note pt stable without symptoms of angina, blood pressure well controlled. Updated echo ordered to evaluate moderate mitral regurgitation.    Echo 06/24/2021 with EF 60-65%, mitral regurgitation noted to be mild.   Anticipate pt can proceed with planned procedure barring acute status change.   VS: BP (!) 143/69   Pulse (!) 55   Temp 36.6 C (Oral)   Resp 16   Ht 5\' 10"  (1.778 m)   Wt 89.5 kg   SpO2 98%   BMI 28.32 kg/m   PROVIDERS: Lawerance Cruel, MD is PCP   Sherren Mocha, MD is Cardiologist  LABS: Labs reviewed: Acceptable for surgery. (all labs ordered are listed, but only abnormal results are displayed)  Labs Reviewed  SURGICAL PCR SCREEN - Abnormal; Notable for the following components:      Result Value   Staphylococcus aureus POSITIVE (*)    All other components within normal limits  BASIC METABOLIC PANEL - Abnormal; Notable for the following components:   Glucose, Bld 118 (*)    All other components within normal limits  CBC  TYPE AND SCREEN     IMAGES:   EKG: 06/08/2021 Rate 53 bpm   CV: Echo 06/24/2021  1. Left ventricular ejection fraction, by estimation, is 60 to 65%. The  left ventricle has normal function. The left ventricle has no regional  wall motion abnormalities. Left ventricular diastolic parameters were  normal.   2. Right ventricular systolic function is normal. The right ventricular  size is normal. Tricuspid regurgitation signal is inadequate for  assessing  PA pressure.   3. The mitral valve is normal in structure. Mild mitral valve  regurgitation. No evidence of mitral stenosis.   4. The aortic valve is tricuspid. Aortic valve regurgitation is not  visualized. Mild to moderate aortic valve sclerosis/calcification is  present, without any evidence of aortic stenosis.   5. Aortic dilatation noted. There is mild dilatation of the aortic root,  measuring 39 mm.   6. The inferior vena cava is normal in size with greater than 50%  respiratory variability, suggesting right atrial pressure of 3 mmHg. Past Medical History:  Diagnosis Date   Coronary artery disease    a. 11/2014 NSTEMI/PCI: LM nl, LAD min irregs, D1 30-40, LCX 30-13m, 100d (3.5x18 Resolute DES), OM1/2 nl, RCA 50p, R->L collats, EF 55%, mild MR.   Hyperlipidemia    Hypertension    put on bp meds. when he was heavier and was not exercising   Ischemic cardiomyopathy    Echo 3/16:  EF 45-50%, inf-lat and inf HK, Gr 2 DD, mod MR, mild LAE   Mitral regurgitation    Osteoarthritis of right hip    Recurrent upper respiratory infection (URI)    current cold being treated with otc meds.    Past Surgical History:  Procedure Laterality Date   CORONARY ANGIOPLASTY WITH STENT PLACEMENT  11/12/2014   "1"   JOINT REPLACEMENT     LEFT HEART CATHETERIZATION WITH CORONARY  ANGIOGRAM N/A 11/12/2014   Procedure: LEFT HEART CATHETERIZATION WITH CORONARY ANGIOGRAM;  Surgeon: Blane Ohara, MD;  Location: [facility] CATH LAB;  Service: Cardiovascular;  Laterality: N/A;   TONSILLECTOMY  1950's   TOTAL HIP ARTHROPLASTY  08/30/2011   Procedure: TOTAL HIP ARTHROPLASTY;  Surgeon: Garald Balding, MD;  Location: [place] [facility];  Service: Orthopedics;  Laterality: Right;   VASECTOMY      MEDICATIONS:  Ascorbic Acid (VITAMIN C) 1000 MG tablet   aspirin 81 MG chewable tablet   atorvastatin (LIPITOR) 80 MG tablet   benazepril-hydrochlorthiazide (LOTENSIN HCT) 20-25 MG per tablet   metoprolol tartrate  (LOPRESSOR) 25 MG tablet   Multiple Vitamins-Minerals (MULTIVITAMIN WITH MINERALS) tablet   nitroGLYCERIN (NITROSTAT) 0.4 MG SL tablet   Omega-3 Fatty Acids (FISH OIL) 1200 MG CAPS   No current facility-administered medications for this encounter.     Konrad Felix Ward, PA-C [facility]-Surgical Testing [phone]

## 2021-06-30 NOTE — Anesthesia Preprocedure Evaluation (Addendum)
Anesthesia Evaluation  Patient identified by MRN, date of birth, ID band Patient awake    Reviewed: Allergy & Precautions, NPO status , Patient's Chart, lab work & pertinent test results  Airway Mallampati: II  TM Distance: >3 FB Neck ROM: Full    Dental  (+) Teeth Intact, Dental Advisory Given   Pulmonary neg pulmonary ROS,    breath sounds clear to auscultation       Cardiovascular hypertension, Pt. on medications and Pt. on home beta blockers + CAD, + Past MI and + Cardiac Stents   Rhythm:Regular Rate:Normal  Echo:  1. Left ventricular ejection fraction, by estimation, is 60 to 65%. The  left ventricle has normal function. The left ventricle has no regional  wall motion abnormalities. Left ventricular diastolic parameters were  normal.  2. Right ventricular systolic function is normal. The right ventricular  size is normal. Tricuspid regurgitation signal is inadequate for assessing  PA pressure.  3. The mitral valve is normal in structure. Mild mitral valve  regurgitation. No evidence of mitral stenosis.  4. The aortic valve is tricuspid. Aortic valve regurgitation is not  visualized. Mild to moderate aortic valve sclerosis/calcification is  present, without any evidence of aortic stenosis.  5. Aortic dilatation noted. There is mild dilatation of the aortic root,  measuring 39 mm.  6. The inferior vena cava is normal in size with greater than 50%  respiratory variability, suggesting right atrial pressure of 3 mmHg.    Neuro/Psych negative neurological ROS  negative psych ROS   GI/Hepatic negative GI ROS, Neg liver ROS,   Endo/Other  negative endocrine ROS  Renal/GU negative Renal ROS     Musculoskeletal  (+) Arthritis ,   Abdominal Normal abdominal exam  (+)   Peds  Hematology   Anesthesia Other Findings   Reproductive/Obstetrics                           Anesthesia  Physical Anesthesia Plan  ASA: 3  Anesthesia Plan: Spinal   Post-op Pain Management:    Induction: Intravenous  PONV Risk Score and Plan: 2 and Ondansetron, Propofol infusion and Treatment may vary due to age or medical condition  Airway Management Planned: Natural Airway and Simple Face Mask  Additional Equipment: None  Intra-op Plan:   Post-operative Plan:   Informed Consent: I have reviewed the patients History and Physical, chart, labs and discussed the procedure including the risks, benefits and alternatives for the proposed anesthesia with the patient or authorized representative who has indicated his/her understanding and acceptance.       Plan Discussed with: CRNA  Anesthesia Plan Comments: (See PAT note 06/29/2021, Konrad Felix Ward, PA-C)      Anesthesia Quick Evaluation

## 2021-07-07 ENCOUNTER — Other Ambulatory Visit: Payer: Self-pay | Admitting: Orthopaedic Surgery

## 2021-07-07 LAB — SARS CORONAVIRUS 2 (TAT 6-24 HRS): SARS Coronavirus 2: NEGATIVE

## 2021-07-08 NOTE — H&P (Signed)
TOTAL HIP ADMISSION H&P  Patient is admitted for left total hip arthroplasty.  Subjective:  Chief Complaint: left hip pain  HPI: Leonard Rodriguez, 75 y.o. male, has a history of pain and functional disability in the left hip(s) due to arthritis and patient has failed non-surgical conservative treatments for greater than 12 weeks to include NSAID's and/or analgesics, corticosteriod injections, flexibility and strengthening excercises, weight reduction as appropriate, and activity modification.  Onset of symptoms was gradual starting 1 years ago with gradually worsening course since that time.The patient noted no past surgery on the left hip(s).  Patient currently rates pain in the left hip at 9 out of 10 with activity. Patient has night pain, worsening of pain with activity and weight bearing, trendelenberg gait, pain that interfers with activities of daily living, and pain with passive range of motion. Patient has evidence of subchondral sclerosis, periarticular osteophytes, and joint space narrowing by imaging studies. This condition presents safety issues increasing the risk of falls.  There is no current active infection.  Patient Active Problem List   Diagnosis Date Noted   Unilateral primary osteoarthritis, left hip 03/03/2021   History of non-ST elevation myocardial infarction (NSTEMI) 11/21/2014   CAD (coronary artery disease) 11/13/2014   HTN (hypertension) 11/13/2014   Hyperlipidemia 11/13/2014   STEMI (ST elevation myocardial infarction) ([place]) 11/12/2014   History of hypertension 11/12/2014   NSTEMI (non-ST elevated myocardial infarction) ([place]) 11/12/2014   Postoperative anemia due to acute blood loss 09/01/2011   Osteoarthritis of hip 08/30/2011   Past Medical History:  Diagnosis Date   Coronary artery disease    a. 11/2014 NSTEMI/PCI: LM nl, LAD min irregs, D1 30-40, LCX 30-75m, 100d (3.5x18 Resolute DES), OM1/2 nl, RCA 50p, R->L collats, EF 55%, mild MR.   Hyperlipidemia     Hypertension    put on bp meds. when he was heavier and was not exercising   Ischemic cardiomyopathy    Echo 3/16:  EF 45-50%, inf-lat and inf HK, Gr 2 DD, mod MR, mild LAE   Mitral regurgitation    Osteoarthritis of right hip    Recurrent upper respiratory infection (URI)    current cold being treated with otc meds.    Past Surgical History:  Procedure Laterality Date   CORONARY ANGIOPLASTY WITH STENT PLACEMENT  11/12/2014   "1"   JOINT REPLACEMENT     LEFT HEART CATHETERIZATION WITH CORONARY ANGIOGRAM N/A 11/12/2014   Procedure: LEFT HEART CATHETERIZATION WITH CORONARY ANGIOGRAM;  Surgeon: Blane Ohara, MD;  Location: [facility] CATH LAB;  Service: Cardiovascular;  Laterality: N/A;   TONSILLECTOMY  1950's   TOTAL HIP ARTHROPLASTY  08/30/2011   Procedure: TOTAL HIP ARTHROPLASTY;  Surgeon: Garald Balding, MD;  Location: [place];  Service: Orthopedics;  Laterality: Right;   VASECTOMY      No current facility-administered medications for this encounter.   Current Outpatient Medications  Medication Sig Dispense Refill Last Dose   Ascorbic Acid (VITAMIN C) 1000 MG tablet Take 1,000 mg by mouth daily.      aspirin 81 MG chewable tablet Chew 1 tablet (81 mg total) by mouth daily.      atorvastatin (LIPITOR) 80 MG tablet TAKE ONE TABLET BY MOUTH DAILY AT 6:00 IN THE EVENING 90 tablet 1    benazepril-hydrochlorthiazide (LOTENSIN HCT) 20-25 MG per tablet Take 1 tablet by mouth daily.      metoprolol tartrate (LOPRESSOR) 25 MG tablet TAKE ONE TABLET BY MOUTH TWICE A DAY 180 tablet 3  Multiple Vitamins-Minerals (MULTIVITAMIN WITH MINERALS) tablet Take 1 tablet by mouth daily.       nitroGLYCERIN (NITROSTAT) 0.4 MG SL tablet Place 1 tablet (0.4 mg total) under the tongue every 5 (five) minutes x 3 doses as needed for chest pain. 25 tablet 3    Omega-3 Fatty Acids (FISH OIL) 1200 MG CAPS Take 1,200 mg by mouth daily.      No Known Allergies  Social History   Tobacco Use   Smoking status: Never    Smokeless tobacco: Never  Substance Use Topics   Alcohol use: Yes    Comment: 11/12/2014 "beer maybe twice/month"    Family History  Problem Relation Age of Onset   Lung cancer Father    Heart attack Neg Hx      Review of Systems  Musculoskeletal:  Positive for gait problem.  All other systems reviewed and are negative.  Objective:  Physical Exam Vitals reviewed.  Constitutional:      Appearance: Normal appearance.  HENT:     Head: Normocephalic and atraumatic.  Eyes:     Extraocular Movements: Extraocular movements intact.     Pupils: Pupils are equal, round, and reactive to light.  Cardiovascular:     Rate and Rhythm: Normal rate.  Pulmonary:     Effort: Pulmonary effort is normal.     Breath sounds: Normal breath sounds.  Abdominal:     Palpations: Abdomen is soft.  Musculoskeletal:     Cervical back: Normal range of motion and neck supple.     Left hip: Tenderness and bony tenderness present. Decreased range of motion. Decreased strength.  Neurological:     Mental Status: He is alert and oriented to person, place, and time.  Psychiatric:        Behavior: Behavior normal.    Vital signs in last 24 hours:    Labs:   Estimated body mass index is 28.32 kg/m as calculated from the following:   Height as of 06/29/21: 5\' 10"  (1.778 m).   Weight as of 06/29/21: 89.5 kg.   Imaging Review Plain radiographs demonstrate severe degenerative joint disease of the left hip(s). The bone quality appears to be good for age and reported activity level.      Assessment/Plan:  End stage arthritis, left hip(s)  The patient history, physical examination, clinical judgement of the provider and imaging studies are consistent with end stage degenerative joint disease of the left hip(s) and total hip arthroplasty is deemed medically necessary. The treatment options including medical management, injection therapy, arthroscopy and arthroplasty were discussed at length. The risks  and benefits of total hip arthroplasty were presented and reviewed. The risks due to aseptic loosening, infection, stiffness, dislocation/subluxation,  thromboembolic complications and other imponderables were discussed.  The patient acknowledged the explanation, agreed to proceed with the plan and consent was signed. Patient is being admitted for inpatient treatment for surgery, pain control, PT, OT, prophylactic antibiotics, VTE prophylaxis, progressive ambulation and ADL's and discharge planning.The patient is planning to be discharged home with home health services

## 2021-07-09 ENCOUNTER — Encounter (HOSPITAL_COMMUNITY)
Admission: RE | Disposition: A | Discharge status: Home / Self Care | Payer: Self-pay | Source: Home / Self Care | Attending: Orthopaedic Surgery

## 2021-07-09 ENCOUNTER — Encounter (HOSPITAL_COMMUNITY): Payer: Self-pay | Admitting: Orthopaedic Surgery

## 2021-07-09 ENCOUNTER — Other Ambulatory Visit: Payer: Self-pay

## 2021-07-09 ENCOUNTER — Observation Stay (HOSPITAL_COMMUNITY): Payer: Medicare HMO

## 2021-07-09 ENCOUNTER — Ambulatory Visit (HOSPITAL_COMMUNITY): Payer: Medicare HMO | Admitting: Physician Assistant

## 2021-07-09 ENCOUNTER — Ambulatory Visit (HOSPITAL_COMMUNITY): Payer: Medicare HMO | Admitting: Certified Registered Nurse Anesthetist

## 2021-07-09 ENCOUNTER — Observation Stay (HOSPITAL_COMMUNITY)
Admission: RE | Admit: 2021-07-09 | Discharge: 2021-07-10 | Disposition: A | Discharge status: Home / Self Care | Payer: Medicare HMO | Attending: Orthopaedic Surgery | Admitting: Orthopaedic Surgery

## 2021-07-09 ENCOUNTER — Ambulatory Visit (HOSPITAL_COMMUNITY): Payer: Medicare HMO

## 2021-07-09 DIAGNOSIS — E785 Hyperlipidemia, unspecified: Secondary | ICD-10-CM | POA: Diagnosis not present

## 2021-07-09 DIAGNOSIS — S82122A Displaced fracture of lateral condyle of left tibia, initial encounter for closed fracture: Secondary | ICD-10-CM

## 2021-07-09 DIAGNOSIS — Z96641 Presence of right artificial hip joint: Secondary | ICD-10-CM | POA: Insufficient documentation

## 2021-07-09 DIAGNOSIS — I10 Essential (primary) hypertension: Secondary | ICD-10-CM | POA: Insufficient documentation

## 2021-07-09 DIAGNOSIS — I2510 Atherosclerotic heart disease of native coronary artery without angina pectoris: Secondary | ICD-10-CM | POA: Insufficient documentation

## 2021-07-09 DIAGNOSIS — Z96642 Presence of left artificial hip joint: Secondary | ICD-10-CM | POA: Diagnosis not present

## 2021-07-09 DIAGNOSIS — Z79899 Other long term (current) drug therapy: Secondary | ICD-10-CM | POA: Diagnosis not present

## 2021-07-09 DIAGNOSIS — M1612 Unilateral primary osteoarthritis, left hip: Principal | ICD-10-CM | POA: Insufficient documentation

## 2021-07-09 DIAGNOSIS — Z471 Aftercare following joint replacement surgery: Secondary | ICD-10-CM | POA: Diagnosis not present

## 2021-07-09 HISTORY — PX: TOTAL HIP ARTHROPLASTY: SHX124

## 2021-07-09 LAB — TYPE AND SCREEN
ABO/RH(D): A POS
Antibody Screen: NEGATIVE

## 2021-07-09 SURGERY — ARTHROPLASTY, HIP, TOTAL, ANTERIOR APPROACH
Anesthesia: Spinal | Site: Hip | Laterality: Left

## 2021-07-09 MED ORDER — OXYCODONE HCL 5 MG PO TABS
10.0000 mg | ORAL_TABLET | ORAL | Status: DC | PRN
  Administered 2021-07-09: 10 mg via ORAL
  Filled 2021-07-09: qty 2

## 2021-07-09 MED ORDER — LACTATED RINGERS IV SOLN: INTRAVENOUS | Status: DC

## 2021-07-09 MED ORDER — ORAL CARE MOUTH RINSE: 15.0000 mL | Freq: Once | OROMUCOSAL | Status: AC

## 2021-07-09 MED ORDER — HYDROMORPHONE HCL 1 MG/ML IJ SOLN
0.5000 mg | INTRAMUSCULAR | Status: DC | PRN
  Filled 2021-07-09: qty 1

## 2021-07-09 MED ORDER — PROPOFOL 1000 MG/100ML IV EMUL
INTRAVENOUS | Status: AC
  Filled 2021-07-09: qty 100

## 2021-07-09 MED ORDER — DEXAMETHASONE SODIUM PHOSPHATE 10 MG/ML IJ SOLN
INTRAMUSCULAR | Status: DC | PRN
  Administered 2021-07-09: 5 mg via INTRAVENOUS

## 2021-07-09 MED ORDER — STERILE WATER FOR IRRIGATION IR SOLN
Status: DC | PRN
  Administered 2021-07-09: 2000 mL

## 2021-07-09 MED ORDER — PROMETHAZINE HCL 25 MG/ML IJ SOLN: 6.2500 mg | INTRAMUSCULAR | Status: DC | PRN

## 2021-07-09 MED ORDER — PROPOFOL 500 MG/50ML IV EMUL
INTRAVENOUS | Status: DC | PRN
  Administered 2021-07-09: 100 ug/kg/min via INTRAVENOUS

## 2021-07-09 MED ORDER — MENTHOL 3 MG MT LOZG: 1.0000 | LOZENGE | OROMUCOSAL | Status: DC | PRN

## 2021-07-09 MED ORDER — METOCLOPRAMIDE HCL 5 MG/ML IJ SOLN: 5.0000 mg | Freq: Three times a day (TID) | INTRAMUSCULAR | Status: DC | PRN

## 2021-07-09 MED ORDER — OXYCODONE HCL 5 MG PO TABS
5.0000 mg | ORAL_TABLET | ORAL | Status: DC | PRN
Start: 2021-07-09 — End: 2021-07-10
  Administered 2021-07-10: 10 mg via ORAL
  Administered 2021-07-10: 5 mg via ORAL
  Administered 2021-07-10: 10 mg via ORAL
  Filled 2021-07-09 (×3): qty 2

## 2021-07-09 MED ORDER — ACETAMINOPHEN 160 MG/5ML PO SOLN: 325.0000 mg | Freq: Once | ORAL | Status: DC | PRN

## 2021-07-09 MED ORDER — ACETAMINOPHEN 325 MG PO TABS: 325.0000 mg | ORAL_TABLET | Freq: Once | ORAL | Status: DC | PRN

## 2021-07-09 MED ORDER — ONDANSETRON HCL 4 MG/2ML IJ SOLN
INTRAMUSCULAR | Status: DC | PRN
  Administered 2021-07-09: 4 mg via INTRAVENOUS

## 2021-07-09 MED ORDER — PHENYLEPHRINE HCL-NACL 20-0.9 MG/250ML-% IV SOLN
INTRAVENOUS | Status: AC
  Filled 2021-07-09: qty 500

## 2021-07-09 MED ORDER — PROPOFOL 10 MG/ML IV BOLUS
INTRAVENOUS | Status: DC | PRN
  Administered 2021-07-09 (×2): 20 mg via INTRAVENOUS

## 2021-07-09 MED ORDER — AMISULPRIDE (ANTIEMETIC) 5 MG/2ML IV SOLN: 10.0000 mg | Freq: Once | INTRAVENOUS | Status: DC | PRN

## 2021-07-09 MED ORDER — FENTANYL CITRATE (PF) 100 MCG/2ML IJ SOLN
INTRAMUSCULAR | Status: DC | PRN
  Administered 2021-07-09 (×2): 50 ug via INTRAVENOUS

## 2021-07-09 MED ORDER — METHOCARBAMOL 500 MG IVPB - SIMPLE MED
500.0000 mg | Freq: Four times a day (QID) | INTRAVENOUS | Status: DC | PRN
  Filled 2021-07-09: qty 50

## 2021-07-09 MED ORDER — ALUM & MAG HYDROXIDE-SIMETH 200-200-20 MG/5ML PO SUSP: 30.0000 mL | ORAL | Status: DC | PRN

## 2021-07-09 MED ORDER — ASCORBIC ACID 500 MG PO TABS
1000.0000 mg | ORAL_TABLET | Freq: Every day | ORAL | Status: DC
  Administered 2021-07-10: 1000 mg via ORAL
  Filled 2021-07-09: qty 2

## 2021-07-09 MED ORDER — FENTANYL CITRATE (PF) 100 MCG/2ML IJ SOLN
INTRAMUSCULAR | Status: AC
  Filled 2021-07-09: qty 2

## 2021-07-09 MED ORDER — PANTOPRAZOLE SODIUM 40 MG PO TBEC
40.0000 mg | DELAYED_RELEASE_TABLET | Freq: Every day | ORAL | Status: DC
  Administered 2021-07-10: 40 mg via ORAL
  Filled 2021-07-09: qty 1

## 2021-07-09 MED ORDER — CHLORHEXIDINE GLUCONATE 0.12 % MT SOLN
15.0000 mL | Freq: Once | OROMUCOSAL | Status: AC
  Administered 2021-07-09: 15 mL via OROMUCOSAL

## 2021-07-09 MED ORDER — ACETAMINOPHEN 10 MG/ML IV SOLN
1000.0000 mg | Freq: Once | INTRAVENOUS | Status: DC | PRN
Start: 2021-07-09 — End: 2021-07-09

## 2021-07-09 MED ORDER — DEXAMETHASONE SODIUM PHOSPHATE 10 MG/ML IJ SOLN
INTRAMUSCULAR | Status: AC
  Filled 2021-07-09: qty 1

## 2021-07-09 MED ORDER — DIPHENHYDRAMINE HCL 12.5 MG/5ML PO ELIX: 12.5000 mg | ORAL_SOLUTION | ORAL | Status: DC | PRN

## 2021-07-09 MED ORDER — PHENOL 1.4 % MT LIQD: 1.0000 | OROMUCOSAL | Status: DC | PRN

## 2021-07-09 MED ORDER — ADULT MULTIVITAMIN W/MINERALS CH
1.0000 | ORAL_TABLET | Freq: Every day | ORAL | Status: DC
  Administered 2021-07-10: 1 via ORAL
  Filled 2021-07-09: qty 1

## 2021-07-09 MED ORDER — SODIUM CHLORIDE 0.9 % IR SOLN
Status: DC | PRN
  Administered 2021-07-09: 1000 mL

## 2021-07-09 MED ORDER — BENAZEPRIL HCL 20 MG PO TABS
20.0000 mg | ORAL_TABLET | Freq: Every day | ORAL | Status: DC
  Administered 2021-07-10: 20 mg via ORAL
  Filled 2021-07-09: qty 1

## 2021-07-09 MED ORDER — SODIUM CHLORIDE 0.9 % IV SOLN: INTRAVENOUS | Status: DC

## 2021-07-09 MED ORDER — BENAZEPRIL-HYDROCHLOROTHIAZIDE 20-25 MG PO TABS: 1.0000 | ORAL_TABLET | Freq: Every day | ORAL | Status: DC

## 2021-07-09 MED ORDER — PHENYLEPHRINE HCL-NACL 20-0.9 MG/250ML-% IV SOLN
INTRAVENOUS | Status: DC | PRN
  Administered 2021-07-09: 40 ug/min via INTRAVENOUS

## 2021-07-09 MED ORDER — METHOCARBAMOL 500 MG PO TABS
500.0000 mg | ORAL_TABLET | Freq: Four times a day (QID) | ORAL | Status: DC | PRN
  Administered 2021-07-09 – 2021-07-10 (×3): 500 mg via ORAL
  Filled 2021-07-09 (×3): qty 1

## 2021-07-09 MED ORDER — BUPIVACAINE IN DEXTROSE 0.75-8.25 % IT SOLN
INTRATHECAL | Status: DC | PRN
  Administered 2021-07-09: 2 mL via INTRATHECAL

## 2021-07-09 MED ORDER — CEFAZOLIN SODIUM-DEXTROSE 1-4 GM/50ML-% IV SOLN
1.0000 g | Freq: Four times a day (QID) | INTRAVENOUS | Status: AC
Start: 2021-07-09 — End: 2021-07-09
  Administered 2021-07-09 (×2): 1 g via INTRAVENOUS
  Filled 2021-07-09 (×2): qty 50

## 2021-07-09 MED ORDER — ONDANSETRON HCL 4 MG/2ML IJ SOLN: 4.0000 mg | Freq: Four times a day (QID) | INTRAMUSCULAR | Status: DC | PRN

## 2021-07-09 MED ORDER — HYDROCHLOROTHIAZIDE 25 MG PO TABS
25.0000 mg | ORAL_TABLET | Freq: Every day | ORAL | Status: DC
  Administered 2021-07-10: 25 mg via ORAL
  Filled 2021-07-09: qty 1

## 2021-07-09 MED ORDER — METOPROLOL TARTRATE 25 MG PO TABS
25.0000 mg | ORAL_TABLET | Freq: Two times a day (BID) | ORAL | Status: DC
  Administered 2021-07-09 – 2021-07-10 (×2): 25 mg via ORAL
  Filled 2021-07-09 (×2): qty 1

## 2021-07-09 MED ORDER — DOCUSATE SODIUM 100 MG PO CAPS
100.0000 mg | ORAL_CAPSULE | Freq: Two times a day (BID) | ORAL | Status: DC
  Administered 2021-07-09 – 2021-07-10 (×2): 100 mg via ORAL
  Filled 2021-07-09 (×2): qty 1

## 2021-07-09 MED ORDER — ASPIRIN 81 MG PO CHEW
81.0000 mg | CHEWABLE_TABLET | Freq: Two times a day (BID) | ORAL | Status: DC
  Administered 2021-07-09 – 2021-07-10 (×2): 81 mg via ORAL
  Filled 2021-07-09 (×2): qty 1

## 2021-07-09 MED ORDER — POVIDONE-IODINE 10 % EX SWAB
2.0000 "application " | Freq: Once | CUTANEOUS | Status: AC
  Administered 2021-07-09: 2 via TOPICAL

## 2021-07-09 MED ORDER — 0.9 % SODIUM CHLORIDE (POUR BTL) OPTIME
TOPICAL | Status: DC | PRN
  Administered 2021-07-09: 1000 mL

## 2021-07-09 MED ORDER — MEPERIDINE HCL 50 MG/ML IJ SOLN: 6.2500 mg | INTRAMUSCULAR | Status: DC | PRN

## 2021-07-09 MED ORDER — ACETAMINOPHEN 325 MG PO TABS
325.0000 mg | ORAL_TABLET | Freq: Four times a day (QID) | ORAL | Status: DC | PRN
  Administered 2021-07-09: 650 mg via ORAL
  Filled 2021-07-09: qty 2

## 2021-07-09 MED ORDER — METOCLOPRAMIDE HCL 5 MG PO TABS
5.0000 mg | ORAL_TABLET | Freq: Three times a day (TID) | ORAL | Status: DC | PRN
Start: 2021-07-09 — End: 2021-07-10
  Filled 2021-07-09: qty 2

## 2021-07-09 MED ORDER — CEFAZOLIN SODIUM-DEXTROSE 2-4 GM/100ML-% IV SOLN
2.0000 g | INTRAVENOUS | Status: AC
  Administered 2021-07-09: 2 g via INTRAVENOUS
  Filled 2021-07-09: qty 100

## 2021-07-09 MED ORDER — TRANEXAMIC ACID-NACL 1000-0.7 MG/100ML-% IV SOLN
1000.0000 mg | INTRAVENOUS | Status: AC
  Administered 2021-07-09: 1000 mg via INTRAVENOUS
  Filled 2021-07-09: qty 100

## 2021-07-09 MED ORDER — HYDROMORPHONE HCL 1 MG/ML IJ SOLN: 0.2500 mg | INTRAMUSCULAR | Status: DC | PRN

## 2021-07-09 MED ORDER — ONDANSETRON HCL 4 MG PO TABS
4.0000 mg | ORAL_TABLET | Freq: Four times a day (QID) | ORAL | Status: DC | PRN
  Filled 2021-07-09: qty 1

## 2021-07-09 MED ORDER — NITROGLYCERIN 0.4 MG SL SUBL: 0.4000 mg | SUBLINGUAL_TABLET | SUBLINGUAL | Status: DC | PRN

## 2021-07-09 MED ORDER — ONDANSETRON HCL 4 MG/2ML IJ SOLN
INTRAMUSCULAR | Status: AC
  Filled 2021-07-09: qty 2

## 2021-07-09 SURGICAL SUPPLY — 40 items
ACETAB CUP W/GRIPTION 54 (Plate) ×2 IMPLANT
ARTICULEZE HEAD (Hips) ×2 IMPLANT
BAG COUNTER SPONGE SURGICOUNT (BAG) ×2 IMPLANT
BAG ZIPLOCK 12X15 (MISCELLANEOUS) IMPLANT
BENZOIN TINCTURE PRP APPL 2/3 (GAUZE/BANDAGES/DRESSINGS) IMPLANT
BLADE SAW SGTL 18X1.27X75 (BLADE) ×2 IMPLANT
COVER PERINEAL POST (MISCELLANEOUS) ×2 IMPLANT
COVER SURGICAL LIGHT HANDLE (MISCELLANEOUS) ×2 IMPLANT
CUP ACETAB W/GRIPTION 54 (Plate) ×1 IMPLANT
DRAPE FOOT SWITCH (DRAPES) ×2 IMPLANT
DRAPE STERI IOBAN 125X83 (DRAPES) ×2 IMPLANT
DRAPE U-SHAPE 47X51 STRL (DRAPES) ×4 IMPLANT
DRSG AQUACEL AG ADV 3.5X10 (GAUZE/BANDAGES/DRESSINGS) ×2 IMPLANT
DURAPREP 26ML APPLICATOR (WOUND CARE) ×2 IMPLANT
ELECT REM PT RETURN 15FT ADLT (MISCELLANEOUS) ×2 IMPLANT
GAUZE XEROFORM 1X8 LF (GAUZE/BANDAGES/DRESSINGS) ×2 IMPLANT
GLOVE SRG 8 PF TXTR STRL LF DI (GLOVE) ×2 IMPLANT
GLOVE SURG ENC MOIS LTX SZ7.5 (GLOVE) ×2 IMPLANT
GLOVE SURG NEOPR MICRO LF SZ8 (GLOVE) ×2 IMPLANT
GLOVE SURG UNDER POLY LF SZ8 (GLOVE) ×2
GOWN STRL REUS W/TWL XL LVL3 (GOWN DISPOSABLE) ×4 IMPLANT
HANDPIECE INTERPULSE COAX TIP (DISPOSABLE) ×1
HEAD ARTICULEZE (Hips) ×1 IMPLANT
HOLDER FOLEY CATH W/STRAP (MISCELLANEOUS) ×2 IMPLANT
KIT TURNOVER KIT A (KITS) IMPLANT
LINER NEUTRAL 54X36MM PLUS 4 (Hips) ×2 IMPLANT
PACK ANTERIOR HIP CUSTOM (KITS) ×2 IMPLANT
PENCIL SMOKE EVACUATOR (MISCELLANEOUS) IMPLANT
SET HNDPC FAN SPRY TIP SCT (DISPOSABLE) ×1 IMPLANT
STAPLER VISISTAT 35W (STAPLE) IMPLANT
STEM FEMORAL SZ5 HIGH ACTIS (Stem) ×2 IMPLANT
STRIP CLOSURE SKIN 1/2X4 (GAUZE/BANDAGES/DRESSINGS) IMPLANT
SUT ETHIBOND NAB CT1 #1 30IN (SUTURE) ×2 IMPLANT
SUT ETHILON 2 0 PS N (SUTURE) IMPLANT
SUT MNCRL AB 4-0 PS2 18 (SUTURE) IMPLANT
SUT VIC AB 0 CT1 36 (SUTURE) ×2 IMPLANT
SUT VIC AB 1 CT1 36 (SUTURE) ×2 IMPLANT
SUT VIC AB 2-0 CT1 27 (SUTURE) ×2
SUT VIC AB 2-0 CT1 TAPERPNT 27 (SUTURE) ×2 IMPLANT
TRAY FOLEY MTR SLVR 16FR STAT (SET/KITS/TRAYS/PACK) IMPLANT

## 2021-07-09 NOTE — Evaluation (Signed)
Physical Therapy Evaluation Patient Details Name: Leonard Rodriguez MRN: [id number] DOB: 04-08-1946 Today's Date: 07/09/2021  History of Present Illness  Patient is 75 y.o. male s/p Lt THA anterior approach on 07/09/21 with PMH significant for CAD, HTN, HLD, OA, Rt THA in 2012.    Clinical Impression  Leonard Rodriguez is a 75 y.o. male POD 0 s/p Lt THA. Patient reports independence with mobility at baseline. Patient is now limited by functional impairments (see PT problem list below) and requires min assist for transfers and gait with RW. Patient was able to ambulate ~100 feet with RW and min guard/assist. Patient instructed in exercise to facilitate circulation to manage edema and reduce risk of DVT. Patient will benefit from continued skilled PT interventions to address impairments and progress towards PLOF. Acute PT will follow to progress mobility and stair training in preparation for safe discharge home.        Recommendations for follow up therapy are one component of a multi-disciplinary discharge planning process, led by the attending physician.  Recommendations may be updated based on patient status, additional functional criteria and insurance authorization.  Follow Up Recommendations Follow physician's recommendations for discharge plan and follow up therapies    Assistance Recommended at Discharge Intermittent Supervision/Assistance  Functional Status Assessment Patient has had a recent decline in their functional status and demonstrates the ability to make significant improvements in function in a reasonable and predictable amount of time.  Equipment Recommendations  Rolling walker (2 wheels)    Recommendations for Other Services       Precautions / Restrictions Precautions Precautions: Fall Restrictions Weight Bearing Restrictions: No Other Position/Activity Restrictions: WBAT      Mobility  Bed Mobility Overal bed mobility: Needs Assistance Bed Mobility: Supine to Sit      Supine to sit: Supervision;HOB elevated     General bed mobility comments: no assist needed, cues to use bed rail.    Transfers Overall transfer level: Needs assistance Equipment used: Rolling walker (2 wheels) Transfers: Sit to/from Stand Sit to Stand: Min assist;From elevated surface           General transfer comment: cues for hand placement on RW and light assist to steady with power up.    Ambulation/Gait Ambulation/Gait assistance: Min guard Gait Distance (Feet): 100 Feet Assistive device: Rolling walker (2 wheels) Gait Pattern/deviations: Decreased weight shift to left;Step-to pattern;Decreased stride length;Step-through pattern Gait velocity: decr   General Gait Details: cues for safe step to pattern progressing to step through, no LOB noted, guarding for safety.  Stairs            Wheelchair Mobility    Modified Rankin (Stroke Patients Only)       Balance Overall balance assessment: Needs assistance Sitting-balance support: Feet supported Sitting balance-Leahy Scale: Good     Standing balance support: Reliant on assistive device for balance;During functional activity;Bilateral upper extremity supported Standing balance-Leahy Scale: Poor                               Pertinent Vitals/Pain Pain Assessment: 0-10 Pain Location: Lt hip Pain Descriptors / Indicators: Aching Pain Intervention(s): Limited activity within patient's tolerance;Repositioned;Monitored during session    Home Living Family/patient expects to be discharged to:: Private residence Living Arrangements: Spouse/significant other Available Help at Discharge: Family Type of Home: House Home Access: Stairs to enter Entrance Stairs-Rails: None Entrance Stairs-Number of Steps: small curb at door   Home Layout:  One level Home Equipment: Toilet riser      Prior Function Prior Level of Function : Independent/Modified Independent                     Hand  Dominance   Dominant Hand: Right    Extremity/Trunk Assessment   Upper Extremity Assessment Upper Extremity Assessment: Overall WFL for tasks assessed    Lower Extremity Assessment Lower Extremity Assessment: Overall WFL for tasks assessed    Cervical / Trunk Assessment Cervical / Trunk Assessment: Normal  Communication   Communication: No difficulties  Cognition Arousal/Alertness: Awake/alert Behavior During Therapy: WFL for tasks assessed/performed Overall Cognitive Status: Within Functional Limits for tasks assessed                                          General Comments      Exercises     Assessment/Plan    PT Assessment Patient needs continued PT services  PT Problem List Decreased strength;Decreased range of motion;Decreased activity tolerance;Decreased balance;Decreased mobility;Decreased knowledge of use of DME;Decreased knowledge of precautions;Pain       PT Treatment Interventions DME instruction;Gait training;Functional mobility training;Therapeutic activities;Stair training;Therapeutic exercise;Balance training;Patient/family education    PT Goals (Current goals can be found in the Care Plan section)  Acute Rehab PT Goals Patient Stated Goal: get back to gym PT Goal Formulation: With patient Time For Goal Achievement: 07/16/21 Potential to Achieve Goals: Good    Frequency 7X/week   Barriers to discharge        Co-evaluation               AM-PAC PT "6 Clicks" Mobility  Outcome Measure Help needed turning from your back to your side while in a flat bed without using bedrails?: None Help needed moving from lying on your back to sitting on the side of a flat bed without using bedrails?: A Little Help needed moving to and from a bed to a chair (including a wheelchair)?: A Little Help needed standing up from a chair using your arms (e.g., wheelchair or bedside chair)?: A Little Help needed to walk in hospital room?: A  Little Help needed climbing 3-5 steps with a railing? : A Little 6 Click Score: 19    End of Session Equipment Utilized During Treatment: Gait belt Activity Tolerance: Patient tolerated treatment well Patient left: in chair;with call bell/phone within reach;with chair alarm set;with family/visitor present Nurse Communication: Mobility status PT Visit Diagnosis: Muscle weakness (generalized) (M62.81);Difficulty in walking, not elsewhere classified (R26.2)    Time: 6734-1937 PT Time Calculation (min) (ACUTE ONLY): 25 min   Charges:   PT Evaluation $PT Eval Low Complexity: 1 Low PT Treatments $Gait Training: 8-22 mins        Verner Mould, DPT Acute Rehabilitation Services Office [phone] Pager [phone]   Jacques Navy 07/09/2021, 5:45 PM

## 2021-07-09 NOTE — Anesthesia Procedure Notes (Signed)
Spinal  Patient location during procedure: OR Start time: 07/09/2021 8:49 AM End time: 07/09/2021 8:52 AM Reason for block: surgical anesthesia Staffing Performed: anesthesiologist  Anesthesiologist: Effie Berkshire, MD Preanesthetic Checklist Completed: patient identified, IV checked, site marked, risks and benefits discussed, surgical consent, monitors and equipment checked, pre-op evaluation and timeout performed Spinal Block Patient position: sitting Prep: DuraPrep and site prepped and draped Location: L3-4 Injection technique: single-shot Needle Needle type: Pencan  Needle gauge: 24 G Needle length: 10 cm Needle insertion depth: 10 cm Additional Notes Patient tolerated well. No immediate complications.

## 2021-07-09 NOTE — TOC Transition Note (Signed)
Transition of Care [facility]) - CM/SW Discharge Note   Patient Details  Name: Leonard Rodriguez MRN: [id number] Date of Birth: [birth year]  [facility] CM/SW Contact:  Lennart Pall, LCSW Phone Number: 07/09/2021, 12:47 PM   Clinical Narrative:    Met with pt and confirming need for rw - order placed with [employer] for delivery to pt's room this afternoon.  Pt aware he has been set up with [facility] for HHPT.  No further TOC needs.   Final next level of care: [place] Barriers to Discharge: No Barriers Identified   Patient Goals and CMS Choice Patient states their goals for this hospitalization and ongoing recovery are:: return home      Discharge Placement                       Discharge Plan and Services                DME Arranged: Walker rolling DME Agency: [employer] Date DME Agency Contacted: 07/09/21 Time DME Agency Contacted: [id number] Representative spoke with at DME Agency: [place]: PT [place]: [place] Date [place]:  (prearranged via MD office)      Social Determinants of Health (SDOH) Interventions     Readmission Risk Interventions No flowsheet data found.

## 2021-07-09 NOTE — Interval H&P Note (Signed)
History and Physical Interval Note: The patient understands that he is here today for a left total hip replacement to treat his left hip osteoarthritis.  There has been no acute or interval changes in medical status.  Please see recent H&P.  The risks and benefits of surgery been discussed in detail and informed consent was obtained.  The left hip has been marked.  07/09/2021 7:02 AM  Leonard Rodriguez  has presented today for surgery, with the diagnosis of osteoarthritis left hip.  The various methods of treatment have been discussed with the patient and family. After consideration of risks, benefits and other options for treatment, the patient has consented to  Procedure(s): LEFT TOTAL HIP ARTHROPLASTY ANTERIOR APPROACH (Left) as a surgical intervention.  The patient's history has been reviewed, patient examined, no change in status, stable for surgery.  I have reviewed the patient's chart and labs.  Questions were answered to the patient's satisfaction.     Mcarthur Rossetti

## 2021-07-09 NOTE — Plan of Care (Signed)
  Problem: Education: Goal: Knowledge of General Education information will improve Description: Including pain rating scale, medication(s)/side effects and non-pharmacologic comfort measures Outcome: Progressing   Problem: Activity: Goal: Risk for activity intolerance will decrease Outcome: Progressing   Problem: Nutrition: Goal: Adequate nutrition will be maintained Outcome: Progressing   Problem: Elimination: Goal: Will not experience complications related to bowel motility Outcome: Progressing Goal: Will not experience complications related to urinary retention Outcome: Progressing   Problem: Pain Managment: Goal: General experience of comfort will improve Outcome: Progressing   Problem: Safety: Goal: Ability to remain free from injury will improve Outcome: Progressing   Problem: Education: Goal: Knowledge of the prescribed therapeutic regimen will improve Outcome: Progressing   Problem: Activity: Goal: Ability to avoid complications of mobility impairment will improve Outcome: Progressing

## 2021-07-09 NOTE — Anesthesia Procedure Notes (Signed)
Procedure Name: MAC Date/Time: 07/09/2021 9:00 AM Performed by: Claudia Desanctis, CRNA Pre-anesthesia Checklist: Patient identified, Emergency Drugs available, Suction available and Patient being monitored Patient Re-evaluated:Patient Re-evaluated prior to induction Oxygen Delivery Method: Simple face mask

## 2021-07-09 NOTE — Anesthesia Postprocedure Evaluation (Signed)
Anesthesia Post Note  Patient: Leonard Rodriguez  Procedure(s) Performed: LEFT TOTAL HIP ARTHROPLASTY ANTERIOR APPROACH (Left: Hip)     Patient location during evaluation: PACU Anesthesia Type: Spinal Level of consciousness: oriented and awake and alert Pain management: pain level controlled Vital Signs Assessment: post-procedure vital signs reviewed and stable Respiratory status: spontaneous breathing, respiratory function stable and patient connected to nasal cannula oxygen Cardiovascular status: blood pressure returned to baseline and stable Postop Assessment: no headache, no backache, no apparent nausea or vomiting and spinal receding Anesthetic complications: no   No notable events documented.  Last Vitals:  Vitals:   07/09/21 1200 07/09/21 1222  BP: 126/75 (!) 142/62  Pulse: (!) 47 (!) 47  Resp: 15 18  Temp:  36.6 C  SpO2: 98% 99%    Last Pain:  Vitals:   07/09/21 1222  TempSrc: Oral  PainSc: 0-No pain                 Effie Berkshire

## 2021-07-09 NOTE — Op Note (Signed)
NAME: Leonard Rodriguez, Leonard Rodriguez MEDICAL RECORD NO: [id number] ACCOUNT NO: [id number] DATE OF BIRTH: 23-Nov-1945 FACILITY: [facility]ATION: [facility] PHYSICIAN: Lind Guest. Ninfa Linden, MD  Operative Report   DATE OF PROCEDURE: 07/09/2021  PREOPERATIVE DIAGNOSES: Primary osteoarthritis and degenerative joint disease, left hip.  POSTOPERATIVE DIAGNOSES:  Primary osteoarthritis and degenerative joint disease, left hip.  PROCEDURE:  Left total hip arthroplasty through direct anterior approach.  IMPLANTS:  DePuy Sector Gription acetabular component size 54, size 36+4 neutral polyethylene liner, size 5 ACTIS femoral component with high offset, size 36+5 metal hip ball.  SURGEON:  Lind Guest. Ninfa Linden, MD  ASSISTANT: Erskine Emery, PA-C  ANESTHESIA:  Spinal.  ANTIBIOTICS:  2 g IV Ancef.  BLOOD LOSS:  200-300 mL.  COMPLICATIONS:  None.  INDICATIONS:  The patient is a 75 year old gentleman with debilitating arthritis involving his left hip.  He actually has a right total hip arthroplasty done through a posterior approach by one of my partners.  His left hip pain has become debilitating.   He has well documented arthritis in the left hip and does wish to proceed with a total hip arthroplasty on the left side.  Of note, his left side is definitely longer than his right side.  He understands that I cannot shorten him due to instability  purposes.  I think he is just shorter on his right side, but he is stable and x-ray showed stability as well and he has never had instability symptoms on the right side.  We did talk in length about acute blood loss anemia, nerve and vessel injury,  fracture, infection, dislocation, DVT and implant failure, skin and soft tissue issues and leg length differences.  He understands our goals are to decrease pain, improve mobility and overall improve quality of life.  I did talk to him about anterior hip  surgery and how the differences are in these approaches.  DESCRIPTION OF  PROCEDURE:  After informed consent was obtained, appropriate left hip was marked, he was brought to the operating room and sat up on the stretcher where spinal anesthesia was obtained. He was laid in the supine position on a stretcher.  I  again assessed his leg length and he is actually longer on this left side, so I definitely cannot shorten him.  A Foley catheter was placed and I placed traction boots on both his feet.  He was placed supine on the Hana fracture table, with the perineal  post in place and both legs in line skeletal traction device and no traction applied.  We then got some good preoperative assessment radiographs, then prepped the left hip with DuraPrep and sterile drapes.  A timeout was called and he was identified  correct patient, correct left hip.  I then made an incision just inferior and posterior to the anterior superior iliac spine and carried this obliquely down the leg.  We dissected down tensor fascia lata muscle.  Tensor fascia was then divided  longitudinally to proceed with a direct anterior approach to the hip.  We identified and cauterized circumflex vessels and entered the hip capsule, opened up the hip capsule in L-type format finding a moderate joint effusion and significant periarticular  osteophytes around the lateral femoral head and neck.  I placed Cobra retractors around the medial and lateral femoral neck and made a femoral neck cut with an oscillating saw just proximal to the lesser trochanter and completed this with an osteotome.   I placed a corkscrew guide in the femoral head and removed  the femoral head in its entirety and found a wide area devoid of cartilage.  I then placed a bent Hohmann over the medial acetabular rim and removed remnants of acetabular labrum and other  debris.  We then began reaming under direct visualization from a size 43 reamer going all the way up to size 53 reamer, with all reamers placed under direct visualization, last reamer was  placed under direct fluoroscopy, so I could obtain our depth of  reaming by inclination and anteversion.  I then placed real DePuy Sector Gription acetabular component size 54 and a 36+4 polyethylene liner for that size 54 acetabular component.  Attention was then turned to the femur.  With the leg externally rotated  to 120 degrees, extended and adducted, we were able to place a Mueller retractor medially and Hohmann retractor behind the greater trochanter.  We released lateral joint capsule and used a box cutting osteotome to enter the femoral canal and a rongeur to  lateralize, then began broaching using the ACTIS broaching system from a size 0, going up to size 5.  With a size 5 in place, we trialed a high offset femoral neck based off his anatomy and a 36+1.5 hip ball, reduced this in the acetabulum.  We felt  like we needed actually a little bit more leg length for stability.  We dislocated the hip and removed the trial components.  We placed the real ACTIS femoral component with high offset size 5 and went with a real 36+5 metal hip ball and again reduced  this in the acetabulum.  He was stable throughout his arc of rotation. We were pleased with leg length and offset, knowing that we still had him longer, which he was beforehand.  The hip was stable and we appreciated the offset as well.  We then  irrigated the soft tissue with normal saline solution using pulsatile lavage.  The joint capsule was closed with interrupted #1 Vicryl suture followed by 0 Vicryl to close the deep tissue and 2-0 Vicryl to close the subcutaneous tissue.  The skin was  closed with staples.  Aquacel dressing was applied.  He was taken off the Hana table and taken to recovery room in stable condition with all final counts being correct and no complications noted.  Of note, Benita Stabile, PA-C, did assist during the entire  case.  His assistance was crucial for facilitating all aspects of this case.   SHW D: 07/09/2021 10:03:40  am T: 07/09/2021 11:30:00 pm  JOB: [id number]/ [id number]

## 2021-07-09 NOTE — Plan of Care (Signed)
  Problem: Coping: Goal: Level of anxiety will decrease Outcome: Progressing   Problem: Pain Managment: Goal: General experience of comfort will improve Outcome: Progressing   

## 2021-07-09 NOTE — Transfer of Care (Signed)
Immediate Anesthesia Transfer of Care Note  Patient: DUVAL MACLEOD  Procedure(s) Performed: LEFT TOTAL HIP ARTHROPLASTY ANTERIOR APPROACH (Left: Hip)  Patient Location: PACU  Anesthesia Type:Spinal  Level of Consciousness: awake, alert , oriented and patient cooperative  Airway & Oxygen Therapy: Patient Spontanous Breathing  Post-op Assessment: Report given to RN and Post -op Vital signs reviewed and stable  Post vital signs: Reviewed and stable  Last Vitals:  Vitals Value Taken Time  BP 103/58 07/09/21 1019  Temp    Pulse 52 07/09/21 1022  Resp 17 07/09/21 1022  SpO2 100 % 07/09/21 1022  Vitals shown include unvalidated device data.  Last Pain:  Vitals:   07/09/21 0647  TempSrc:   PainSc: 0-No pain      Patients Stated Pain Goal: 4 (24/09/73 5329)  Complications: No notable events documented.

## 2021-07-09 NOTE — Brief Op Note (Signed)
07/09/2021  10:05 AM  PATIENT:  Leonard Rodriguez  76 y.o. male  PRE-OPERATIVE DIAGNOSIS:  osteoarthritis left hip  POST-OPERATIVE DIAGNOSIS:  osteoarthritis left hip  PROCEDURE:  Procedure(s): LEFT TOTAL HIP ARTHROPLASTY ANTERIOR APPROACH (Left)  SURGEON:  Surgeon(s) and Role:    Mcarthur Rossetti, MD - Primary  PHYSICIAN ASSISTANT: Benita Stabile, PA-C  ANESTHESIA:   spinal  EBL:  200 mL   COUNTS:  YES  DICTATION: .Other Dictation: Dictation Number [id number]  PLAN OF CARE: Admit for overnight observation  PATIENT DISPOSITION:  PACU - hemodynamically stable.   Delay start of Pharmacological VTE agent (>24hrs) due to surgical blood loss or risk of bleeding: no

## 2021-07-10 DIAGNOSIS — Z79899 Other long term (current) drug therapy: Secondary | ICD-10-CM | POA: Diagnosis not present

## 2021-07-10 DIAGNOSIS — Z96641 Presence of right artificial hip joint: Secondary | ICD-10-CM | POA: Diagnosis not present

## 2021-07-10 DIAGNOSIS — I2510 Atherosclerotic heart disease of native coronary artery without angina pectoris: Secondary | ICD-10-CM | POA: Diagnosis not present

## 2021-07-10 DIAGNOSIS — I10 Essential (primary) hypertension: Secondary | ICD-10-CM | POA: Diagnosis not present

## 2021-07-10 DIAGNOSIS — M1612 Unilateral primary osteoarthritis, left hip: Secondary | ICD-10-CM | POA: Diagnosis not present

## 2021-07-10 LAB — BASIC METABOLIC PANEL
Anion gap: 6 (ref 5–15)
BUN: 17 mg/dL (ref 8–23)
CO2: 26 mmol/L (ref 22–32)
Calcium: 8.2 mg/dL — ABNORMAL LOW (ref 8.9–10.3)
Chloride: 103 mmol/L (ref 98–111)
Creatinine, Ser: 0.89 mg/dL (ref 0.61–1.24)
GFR, Estimated: >60 mL/min (ref >60)
Glucose, Bld: 132 mg/dL — ABNORMAL HIGH (ref 70–99)
Potassium: 3.7 mmol/L (ref 3.5–5.1)
Sodium: 135 mmol/L (ref 135–145)

## 2021-07-10 LAB — CBC
HCT: 35.6 % — ABNORMAL LOW (ref 39.0–52.0)
Hemoglobin: 11.9 g/dL — ABNORMAL LOW (ref 13.0–17.0)
MCH: 31.6 pg (ref 26.0–34.0)
MCHC: 33.4 g/dL (ref 30.0–36.0)
MCV: 94.4 fL (ref 80.0–100.0)
Platelets: 174 10*3/uL (ref 150–400)
RBC: 3.77 MIL/uL — ABNORMAL LOW (ref 4.22–5.81)
RDW: 12.9 % (ref 11.5–15.5)
WBC: 14.9 10*3/uL — ABNORMAL HIGH (ref 4.0–10.5)
nRBC: 0 % (ref 0.0–0.2)

## 2021-07-10 MED ORDER — OXYCODONE HCL 5 MG PO TABS: 5.0000 mg | ORAL_TABLET | Freq: Four times a day (QID) | ORAL | 0 refills | Status: DC | PRN

## 2021-07-10 MED ORDER — ASPIRIN 81 MG PO CHEW: 81.0000 mg | CHEWABLE_TABLET | Freq: Two times a day (BID) | ORAL | 0 refills | Status: DC

## 2021-07-10 MED ORDER — METHOCARBAMOL 500 MG PO TABS: 500.0000 mg | ORAL_TABLET | Freq: Four times a day (QID) | ORAL | 1 refills | Status: DC | PRN

## 2021-07-10 NOTE — Plan of Care (Signed)
Patient discharged.

## 2021-07-10 NOTE — Progress Notes (Signed)
Subjective: 1 Day Post-Op Procedure(s) (LRB): LEFT TOTAL HIP ARTHROPLASTY ANTERIOR APPROACH (Left) Patient reports pain as moderate.    Objective: Vital signs in last 24 hours: Temp:  [97.8 F (36.6 C)-99.1 F (37.3 C)] 98.5 F (36.9 C) (10/29 1006) Pulse Rate:  [42-85] 66 (10/29 1006) Resp:  [9-20] 20 (10/29 1006) BP: (115-148)/(51-75) 148/66 (10/29 1006) SpO2:  [94 %-99 %] 97 % (10/29 1006) Weight:  [89.5 kg] 89.5 kg (10/28 1222)  Intake/Output from previous day: 10/28 0701 - 10/29 0700 In: 3540 [P.O.:1290; I.V.:2000; IV Piggyback:250] Out: 2050 [Urine:1850; Blood:200] Intake/Output this shift: Total I/O In: 120 [P.O.:120] Out: 300 [Urine:300]  Recent Labs    07/10/21 0256  HGB 11.9*   Recent Labs    07/10/21 0256  WBC 14.9*  RBC 3.77*  HCT 35.6*  PLT 174   Recent Labs    07/10/21 0256  NA 135  K 3.7  CL 103  CO2 26  BUN 17  CREATININE 0.89  GLUCOSE 132*  CALCIUM 8.2*   No results for input(s): LABPT, INR in the last 72 hours.  Sensation intact distally Intact pulses distally Dorsiflexion/Plantar flexion intact Incision: dressing C/D/I   Assessment/Plan: 1 Day Post-Op Procedure(s) (LRB): LEFT TOTAL HIP ARTHROPLASTY ANTERIOR APPROACH (Left) Up with therapy Discharge home with home health      Mcarthur Rossetti 07/10/2021, 10:54 AM

## 2021-07-10 NOTE — Plan of Care (Signed)
  Problem: Education: Goal: Knowledge of General Education information will improve Description: Including pain rating scale, medication(s)/side effects and non-pharmacologic comfort measures Outcome: Progressing   Problem: Activity: Goal: Risk for activity intolerance will decrease Outcome: Progressing   Problem: Pain Managment: Goal: General experience of comfort will improve Outcome: Progressing   

## 2021-07-10 NOTE — Progress Notes (Signed)
Physical Therapy Treatment Patient Details Name: Leonard Rodriguez MRN: [id number] DOB: July 11, 1946 Today's Date: 07/10/2021   History of Present Illness Patient is 75 y.o. male s/p Lt THA anterior approach on 07/09/21 with PMH significant for CAD, HTN, HLD, OA, Rt THA in 2012.    PT Comments    POD # 1 am session General Comments: AxO x 3 very motivted also with prior THR post.  General bed mobility comments: demonstrated and instructed how to use belt to self assist LE.  General transfer comment: increased time.  General Gait Details: one VC safety with turns.  Tolerated a functional distance with light lean on walker. Number of Stairs: 1   General stair comments: one step/curb with <25% VC's on proper walker placement as well as safety.  Performed very well. Then returned to room to perform some TE's following HEP handout.  Instructed on proper tech, freq as well as use of ICE.   Addressed all mobility questions, discussed appropriate activity, educated on use of ICE.  Pt ready for D/C to home.   Recommendations for follow up therapy are one component of a multi-disciplinary discharge planning process, led by the attending physician.  Recommendations may be updated based on patient status, additional functional criteria and insurance authorization.  Follow Up Recommendations  Follow physician's recommendations for discharge plan and follow up therapies     Assistance Recommended at Discharge Intermittent Supervision/Assistance  Equipment Recommendations  Rolling walker (2 wheels)    Recommendations for Other Services       Precautions / Restrictions Precautions Precautions: Fall Restrictions Weight Bearing Restrictions: No LLE Weight Bearing: Weight bearing as tolerated     Mobility  Bed Mobility Overal bed mobility: Needs Assistance Bed Mobility: Supine to Sit     Supine to sit: Supervision;HOB elevated     General bed mobility comments: demonstrated and instructed how to  use belt to self assist LE    Transfers Overall transfer level: Needs assistance Equipment used: Rolling walker (2 wheels) Transfers: Sit to/from Stand Sit to Stand: From elevated surface;Modified independent (Device/Increase time)           General transfer comment: increased time    Ambulation/Gait Ambulation/Gait assistance: Supervision Gait Distance (Feet): 165 Feet Assistive device: Rolling walker (2 wheels) Gait Pattern/deviations: Decreased weight shift to left;Step-to pattern;Decreased stride length;Step-through pattern Gait velocity: decr   General Gait Details: one VC safety with turns.  Tolerated a functional distance with light lean on walker.   Stairs Stairs: Yes Stairs assistance: Supervision Stair Management: Forwards;No rails;Step to pattern;With walker Number of Stairs: 1 General stair comments: one step/curb with <25% VC's on proper walker placement as well as safety.  Performed very well.   Wheelchair Mobility    Modified Rankin (Stroke Patients Only)       Balance                                            Cognition Arousal/Alertness: Awake/alert Behavior During Therapy: WFL for tasks assessed/performed Overall Cognitive Status: Within Functional Limits for tasks assessed                                 General Comments: AxO x 3 very motivted also with prior THR post        Exercises   5 reps  all TE's following HEP handout   General Comments        Pertinent Vitals/Pain Pain Assessment: 0-10 Pain Location: Lt hip Pain Descriptors / Indicators: Aching;Discomfort Pain Intervention(s): Monitored during session;Premedicated before session;Repositioned;Ice applied    Home Living                          Prior Function            PT Goals (current goals can now be found in the care plan section) Progress towards PT goals: Progressing toward goals    Frequency    7X/week       PT Plan Current plan remains appropriate    Co-evaluation              AM-PAC PT "6 Clicks" Mobility   Outcome Measure  Help needed turning from your back to your side while in a flat bed without using bedrails?: None Help needed moving from lying on your back to sitting on the side of a flat bed without using bedrails?: None Help needed moving to and from a bed to a chair (including a wheelchair)?: A Little Help needed standing up from a chair using your arms (e.g., wheelchair or bedside chair)?: A Little Help needed to walk in hospital room?: A Little Help needed climbing 3-5 steps with a railing? : A Little 6 Click Score: 20    End of Session Equipment Utilized During Treatment: Gait belt Activity Tolerance: Patient tolerated treatment well Patient left: in chair;with call bell/phone within reach;with chair alarm set Nurse Communication: Mobility status (pt ready for D/C to home) PT Visit Diagnosis: Muscle weakness (generalized) (M62.81);Difficulty in walking, not elsewhere classified (R26.2)     Time: 1010-1050 PT Time Calculation (min) (ACUTE ONLY): 40 min  Charges:  $Gait Training: 8-22 mins $Therapeutic Exercise: 8-22 mins $Therapeutic Activity: 8-22 mins                    Rica Koyanagi  PTA Acute  Rehabilitation Services Pager      [phone] Office      [phone]

## 2021-07-10 NOTE — Discharge Summary (Signed)
Patient ID: Leonard Rodriguez MRN: [id number] DOB/AGE: 1945/11/12 75 y.o.  Admit date: 07/09/2021 Discharge date: 07/10/2021  Admission Diagnoses:  Principal Problem:   Unilateral primary osteoarthritis, left hip Active Problems:   Status post left hip replacement   Discharge Diagnoses:  Same  Past Medical History:  Diagnosis Date   Coronary artery disease    a. 11/2014 NSTEMI/PCI: LM nl, LAD min irregs, D1 30-40, LCX 30-78m, 100d (3.5x18 Resolute DES), OM1/2 nl, RCA 50p, R->L collats, EF 55%, mild MR.   Hyperlipidemia    Hypertension    put on bp meds. when he was heavier and was not exercising   Ischemic cardiomyopathy    Echo 3/16:  EF 45-50%, inf-lat and inf HK, Gr 2 DD, mod MR, mild LAE   Mitral regurgitation    Osteoarthritis of right hip    Recurrent upper respiratory infection (URI)    current cold being treated with otc meds.    Surgeries: Procedure(s): LEFT TOTAL HIP ARTHROPLASTY ANTERIOR APPROACH on 07/09/2021   Consultants:   Discharged Condition: Improved  Hospital Course: Leonard Rodriguez is an 75 y.o. male who was admitted 07/09/2021 for operative treatment ofUnilateral primary osteoarthritis, left hip. Patient has severe unremitting pain that affects sleep, daily activities, and work/hobbies. After pre-op clearance the patient was taken to the operating room on 07/09/2021 and underwent  Procedure(s): LEFT TOTAL HIP ARTHROPLASTY ANTERIOR APPROACH.    Patient was given perioperative antibiotics:  Anti-infectives (From admission, onward)    Start     Dose/Rate Route Frequency Ordered Stop   07/09/21 1500  ceFAZolin (ANCEF) IVPB 1 g/50 mL premix        1 g 100 mL/hr over 30 Minutes Intravenous Every 6 hours 07/09/21 1016 07/09/21 2330   07/09/21 0600  ceFAZolin (ANCEF) IVPB 2g/100 mL premix        2 g 200 mL/hr over 30 Minutes Intravenous On call to O.R. 07/09/21 0535 07/09/21 0853        Patient was given sequential compression devices, early  ambulation, and chemoprophylaxis to prevent DVT.  Patient benefited maximally from hospital stay and there were no complications.    Recent vital signs: Patient Vitals for the past 24 hrs:  BP Temp Temp src Pulse Resp SpO2 Height Weight  07/10/21 1006 (!) 148/66 98.5 F (36.9 C) Oral 66 20 97 % -- --  07/10/21 0517 138/63 97.9 F (36.6 C) Oral 67 17 96 % -- --  07/10/21 0119 125/61 98.7 F (37.1 C) Oral 73 15 96 % -- --  07/09/21 2038 140/70 99.1 F (37.3 C) Axillary 85 15 94 % -- --  07/09/21 1426 (!) 138/59 97.8 F (36.6 C) Oral 66 18 95 % -- --  07/09/21 1222 (!) 142/62 97.9 F (36.6 C) Oral (!) 47 18 99 % 5\' 10"  (1.778 m) 89.5 kg  07/09/21 1200 126/75 -- -- (!) 47 15 98 % -- --  07/09/21 1145 130/64 -- -- (!) 42 12 97 % -- --  07/09/21 1130 123/62 -- -- (!) 44 13 98 % -- --  07/09/21 1124 -- -- -- (!) 48 12 97 % -- --  07/09/21 1115 (!) 115/54 -- -- (!) 43 14 95 % -- --  07/09/21 1100 (!) 118/51 -- -- (!) 46 (!) 9 99 % -- --     Recent laboratory studies:  Recent Labs    07/10/21 0256  WBC 14.9*  HGB 11.9*  HCT 35.6*  PLT 174  NA 135  K 3.7  CL 103  CO2 26  BUN 17  CREATININE 0.89  GLUCOSE 132*  CALCIUM 8.2*     Discharge Medications:   Allergies as of 07/10/2021   No Known Allergies      Medication List     TAKE these medications    aspirin 81 MG chewable tablet Chew 1 tablet (81 mg total) by mouth 2 (two) times daily. What changed: when to take this   atorvastatin 80 MG tablet Commonly known as: LIPITOR TAKE ONE TABLET BY MOUTH DAILY AT 6:00 IN THE EVENING   benazepril-hydrochlorthiazide 20-25 MG tablet Commonly known as: LOTENSIN HCT Take 1 tablet by mouth daily.   Fish Oil 1200 MG Caps Take 1,200 mg by mouth daily.   methocarbamol 500 MG tablet Commonly known as: ROBAXIN Take 1 tablet (500 mg total) by mouth every 6 (six) hours as needed for muscle spasms.   metoprolol tartrate 25 MG tablet Commonly known as: LOPRESSOR TAKE ONE  TABLET BY MOUTH TWICE A DAY   multivitamin with minerals tablet Take 1 tablet by mouth daily.   nitroGLYCERIN 0.4 MG SL tablet Commonly known as: NITROSTAT Place 1 tablet (0.4 mg total) under the tongue every 5 (five) minutes x 3 doses as needed for chest pain.   oxyCODONE 5 MG immediate release tablet Commonly known as: Oxy IR/ROXICODONE Take 1-2 tablets (5-10 mg total) by mouth every 6 (six) hours as needed for moderate pain (pain score 4-6).   vitamin C 1000 MG tablet Take 1,000 mg by mouth daily.               Durable Medical Equipment  (From admission, onward)           Start     Ordered   07/09/21 1228  DME 3 n 1  Once        07/09/21 1227   07/09/21 1228  DME Walker rolling  Once       Question Answer Comment  Walker: With 5 Inch Wheels   Patient needs a walker to treat with the following condition Status post left hip replacement      07/09/21 1227            Diagnostic Studies: DG Pelvis Portable  Result Date: 07/09/2021 CLINICAL DATA:  Left hip replacement surgery EXAM: PORTABLE PELVIS 1-2 VIEWS COMPARISON:  09/01/2011 FINDINGS: Interval left hip arthroplasty, components project in expected location. No fracture or dislocation. Stable right hip arthroplasty components. Left lateral skin staples. IMPRESSION: Left hip arthroplasty without apparent complication. Electronically Signed   By: Lucrezia Europe M.D.   On: 07/09/2021 10:43   DG C-Arm 1-60 Min-No Report  Result Date: 07/09/2021 CLINICAL DATA:  Total left hip arthroplasty. EXAM: OPERATIVE left HIP (WITH PELVIS IF PERFORMED) 6 VIEWS TECHNIQUE: Fluoroscopic spot image(s) were submitted for interpretation post-operatively. COMPARISON:  Radiographs 03/03/2021 FINDINGS: Fluoroscopic spot images demonstrate placement of a total left hip arthroplasty. The components appear well seated without complicating features. The visualized bony pelvis is intact. IMPRESSION: Total left hip arthroplasty in good position  without complicating features. Electronically Signed   By: Marijo Sanes M.D.   On: 07/09/2021 10:11   ECHOCARDIOGRAM COMPLETE  Result Date: 06/24/2021    ECHOCARDIOGRAM REPORT   Patient Name:   Leonard Rodriguez Date of Exam: 06/24/2021 Medical Rec #:  [id number]      Height:       71.0 in Accession #:    [id number]     Weight:  195.8 lb Date of Birth:  04-29-1946      BSA:          2.090 m Patient Age:    64 years       BP:           130/72 mmHg Patient Gender: M              HR:           53 bpm. Exam Location:  [facility] Procedure: 2D Echo, Cardiac Doppler, Color Doppler and Intracardiac            Opacification Agent Indications:    I34.0 Mitral Valve Insufficiency  History:        Patient has prior history of Echocardiogram examinations, most                 recent 11/26/2014. STENT; Risk Factors:Hypertension and                 Dyslipidemia. [id number]. Ischemic cardiomyopathy.  Sonographer:    Wilford Sports Rodgers-Jones RDCS Referring Phys: [place]  1. Left ventricular ejection fraction, by estimation, is 60 to 65%. The left ventricle has normal function. The left ventricle has no regional wall motion abnormalities. Left ventricular diastolic parameters were normal.  2. Right ventricular systolic function is normal. The right ventricular size is normal. Tricuspid regurgitation signal is inadequate for assessing PA pressure.  3. The mitral valve is normal in structure. Mild mitral valve regurgitation. No evidence of mitral stenosis.  4. The aortic valve is tricuspid. Aortic valve regurgitation is not visualized. Mild to moderate aortic valve sclerosis/calcification is present, without any evidence of aortic stenosis.  5. Aortic dilatation noted. There is mild dilatation of the aortic root, measuring 39 mm.  6. The inferior vena cava is normal in size with greater than 50% respiratory variability, suggesting right atrial pressure of 3 mmHg. FINDINGS  Left Ventricle: Left  ventricular ejection fraction, by estimation, is 60 to 65%. The left ventricle has normal function. The left ventricle has no regional wall motion abnormalities. Definity contrast agent was given IV to delineate the left ventricular  endocardial borders. The left ventricular internal cavity size was normal in size. There is no left ventricular hypertrophy. Left ventricular diastolic parameters were normal. Normal left ventricular filling pressure. Right Ventricle: The right ventricular size is normal. No increase in right ventricular wall thickness. Right ventricular systolic function is normal. Tricuspid regurgitation signal is inadequate for assessing PA pressure. Left Atrium: Left atrial size was normal in size. Right Atrium: Right atrial size was normal in size. Pericardium: There is no evidence of pericardial effusion. Mitral Valve: The mitral valve is normal in structure. Mild mitral valve regurgitation. No evidence of mitral valve stenosis. Tricuspid Valve: The tricuspid valve is normal in structure. Tricuspid valve regurgitation is not demonstrated. No evidence of tricuspid stenosis. Aortic Valve: The aortic valve is tricuspid. Aortic valve regurgitation is not visualized. Mild to moderate aortic valve sclerosis/calcification is present, without any evidence of aortic stenosis. Pulmonic Valve: The pulmonic valve was normal in structure. Pulmonic valve regurgitation is trivial. No evidence of pulmonic stenosis. Aorta: Aortic dilatation noted. There is mild dilatation of the aortic root, measuring 39 mm. Venous: The inferior vena cava is normal in size with greater than 50% respiratory variability, suggesting right atrial pressure of 3 mmHg. IAS/Shunts: No atrial level shunt detected by color flow Doppler.  LEFT VENTRICLE PLAX 2D LVIDd:  4.60 cm   Diastology LVIDs:         3.30 cm   LV e' medial:    10.00 cm/s LV PW:         1.00 cm   LV E/e' medial:  9.4 LV IVS:        1.00 cm   LV e' lateral:   10.70  cm/s LVOT diam:     2.50 cm   LV E/e' lateral: 8.8 LV SV:         128 LV SV Index:   61 LVOT Area:     4.91 cm  RIGHT VENTRICLE             IVC RV Basal diam:  3.50 cm     IVC diam: 1.10 cm RV S prime:     15.50 cm/s TAPSE (M-mode): 2.2 cm LEFT ATRIUM             Index        RIGHT ATRIUM           Index LA diam:        3.90 cm 1.87 cm/m   RA Area:     12.50 cm LA Vol (A2C):   42.4 ml 20.29 ml/m  RA Volume:   30.10 ml  14.40 ml/m LA Vol (A4C):   39.4 ml 18.86 ml/m LA Biplane Vol: 41.6 ml 19.91 ml/m  AORTIC VALVE LVOT Vmax:   109.00 cm/s LVOT Vmean:  73.900 cm/s LVOT VTI:    0.262 m  AORTA Ao Root diam: 3.90 cm Ao Asc diam:  3.60 cm MITRAL VALVE MV Area (PHT): 3.99 cm    SHUNTS MV Decel Time: 190 msec    Systemic VTI:  0.26 m MV E velocity: 94.00 cm/s  Systemic Diam: 2.50 cm MV A velocity: 98.50 cm/s MV E/A ratio:  0.95 Fransico Him MD Electronically signed by Fransico Him MD Signature Date/Time: 06/24/2021/10:12:08 PM    Final    DG HIP OPERATIVE UNILAT W OR W/O PELVIS LEFT  Result Date: 07/09/2021 CLINICAL DATA:  Total left hip arthroplasty. EXAM: OPERATIVE left HIP (WITH PELVIS IF PERFORMED) 6 VIEWS TECHNIQUE: Fluoroscopic spot image(s) were submitted for interpretation post-operatively. COMPARISON:  Radiographs 03/03/2021 FINDINGS: Fluoroscopic spot images demonstrate placement of a total left hip arthroplasty. The components appear well seated without complicating features. The visualized bony pelvis is intact. IMPRESSION: Total left hip arthroplasty in good position without complicating features. Electronically Signed   By: Marijo Sanes M.D.   On: 07/09/2021 10:11    Disposition: Discharge disposition: 01-Home or [place], [place] Follow up.   Specialty: [place] Why: providing home physical therapy Contact information: [address] [place] [address] [phone]         Mcarthur Rossetti, MD  Follow up in 2 week(s).   Specialty: Orthopedic Surgery Contact information: [address] [place] [address] [phone]                  Signed: Mcarthur Rossetti 07/10/2021, 10:55 AM

## 2021-07-10 NOTE — Discharge Instructions (Signed)

## 2021-07-11 DIAGNOSIS — I252 Old myocardial infarction: Secondary | ICD-10-CM | POA: Diagnosis not present

## 2021-07-11 DIAGNOSIS — I2510 Atherosclerotic heart disease of native coronary artery without angina pectoris: Secondary | ICD-10-CM | POA: Diagnosis not present

## 2021-07-11 DIAGNOSIS — K5900 Constipation, unspecified: Secondary | ICD-10-CM | POA: Diagnosis not present

## 2021-07-11 DIAGNOSIS — Z7982 Long term (current) use of aspirin: Secondary | ICD-10-CM | POA: Diagnosis not present

## 2021-07-11 DIAGNOSIS — I340 Nonrheumatic mitral (valve) insufficiency: Secondary | ICD-10-CM | POA: Diagnosis not present

## 2021-07-11 DIAGNOSIS — Z96643 Presence of artificial hip joint, bilateral: Secondary | ICD-10-CM | POA: Diagnosis not present

## 2021-07-11 DIAGNOSIS — E785 Hyperlipidemia, unspecified: Secondary | ICD-10-CM | POA: Diagnosis not present

## 2021-07-11 DIAGNOSIS — I10 Essential (primary) hypertension: Secondary | ICD-10-CM | POA: Diagnosis not present

## 2021-07-11 DIAGNOSIS — I255 Ischemic cardiomyopathy: Secondary | ICD-10-CM | POA: Diagnosis not present

## 2021-07-11 DIAGNOSIS — Z471 Aftercare following joint replacement surgery: Secondary | ICD-10-CM | POA: Diagnosis not present

## 2021-07-12 ENCOUNTER — Encounter (HOSPITAL_COMMUNITY): Payer: Self-pay | Admitting: Orthopaedic Surgery

## 2021-07-22 ENCOUNTER — Other Ambulatory Visit: Payer: Self-pay

## 2021-07-22 ENCOUNTER — Encounter: Payer: Self-pay | Admitting: Orthopaedic Surgery

## 2021-07-22 ENCOUNTER — Ambulatory Visit (INDEPENDENT_AMBULATORY_CARE_PROVIDER_SITE_OTHER): Payer: Medicare HMO | Admitting: Orthopaedic Surgery

## 2021-07-22 DIAGNOSIS — Z96642 Presence of left artificial hip joint: Secondary | ICD-10-CM

## 2021-07-22 NOTE — Progress Notes (Signed)
The patient will be 2 weeks tomorrow status post a left total hip arthroplasty through a direct anterior approach.  He has a remote history of a posterior approach right hip surgery.  He said the left hip is definitely different and he is gotten over it quicker even though he had more thigh pain with this side early on.  He is walking without assistive device.  He is very active 75 year old gentleman and he drove last night.  He is off all pain medications.  He has been taking a baby aspirin twice a day and wearing compressive garments.  He denies any calf pain or any swelling in his foot and ankle.  He can go back down to just once a day aspirin today.  He can stop wearing his TED hose unless he develops swelling.  I did remove all the staples from his left hip incision and place Steri-Strips.  Everything looks good overall.  He will continue increase his activities as comfort allows.  I like to see him back in 4 weeks for repeat exam but no x-rays are needed.  If there are issues before then he will let us know.  All questions and concerns were answered and addressed.

## 2021-07-28 DIAGNOSIS — I2510 Atherosclerotic heart disease of native coronary artery without angina pectoris: Secondary | ICD-10-CM | POA: Diagnosis not present

## 2021-07-28 DIAGNOSIS — E7800 Pure hypercholesterolemia, unspecified: Secondary | ICD-10-CM | POA: Diagnosis not present

## 2021-07-28 DIAGNOSIS — I10 Essential (primary) hypertension: Secondary | ICD-10-CM | POA: Diagnosis not present

## 2021-08-01 ENCOUNTER — Emergency Department (HOSPITAL_BASED_OUTPATIENT_CLINIC_OR_DEPARTMENT_OTHER): Payer: Medicare HMO

## 2021-08-01 ENCOUNTER — Other Ambulatory Visit: Payer: Self-pay

## 2021-08-01 ENCOUNTER — Encounter (HOSPITAL_BASED_OUTPATIENT_CLINIC_OR_DEPARTMENT_OTHER): Payer: Self-pay | Admitting: Urology

## 2021-08-01 ENCOUNTER — Inpatient Hospital Stay (HOSPITAL_BASED_OUTPATIENT_CLINIC_OR_DEPARTMENT_OTHER)
Admission: EM | Admit: 2021-08-01 | Discharge: 2021-08-03 | DRG: 175 | Disposition: A | Discharge status: Home / Self Care | Payer: Medicare HMO | Attending: Family Medicine | Admitting: Family Medicine

## 2021-08-01 DIAGNOSIS — Z9852 Vasectomy status: Secondary | ICD-10-CM

## 2021-08-01 DIAGNOSIS — I2694 Multiple subsegmental pulmonary emboli without acute cor pulmonale: Secondary | ICD-10-CM | POA: Diagnosis not present

## 2021-08-01 DIAGNOSIS — I2510 Atherosclerotic heart disease of native coronary artery without angina pectoris: Secondary | ICD-10-CM | POA: Diagnosis present

## 2021-08-01 DIAGNOSIS — J9601 Acute respiratory failure with hypoxia: Secondary | ICD-10-CM | POA: Diagnosis present

## 2021-08-01 DIAGNOSIS — E785 Hyperlipidemia, unspecified: Secondary | ICD-10-CM | POA: Diagnosis present

## 2021-08-01 DIAGNOSIS — U071 COVID-19: Secondary | ICD-10-CM | POA: Diagnosis present

## 2021-08-01 DIAGNOSIS — I2699 Other pulmonary embolism without acute cor pulmonale: Secondary | ICD-10-CM | POA: Diagnosis present

## 2021-08-01 DIAGNOSIS — D72829 Elevated white blood cell count, unspecified: Secondary | ICD-10-CM | POA: Diagnosis present

## 2021-08-01 DIAGNOSIS — I252 Old myocardial infarction: Secondary | ICD-10-CM

## 2021-08-01 DIAGNOSIS — I2609 Other pulmonary embolism with acute cor pulmonale: Secondary | ICD-10-CM | POA: Diagnosis not present

## 2021-08-01 DIAGNOSIS — Z7901 Long term (current) use of anticoagulants: Secondary | ICD-10-CM

## 2021-08-01 DIAGNOSIS — R7989 Other specified abnormal findings of blood chemistry: Secondary | ICD-10-CM | POA: Diagnosis present

## 2021-08-01 DIAGNOSIS — R0602 Shortness of breath: Secondary | ICD-10-CM | POA: Diagnosis not present

## 2021-08-01 DIAGNOSIS — R000 Tachycardia, unspecified: Secondary | ICD-10-CM | POA: Diagnosis not present

## 2021-08-01 DIAGNOSIS — Z955 Presence of coronary angioplasty implant and graft: Secondary | ICD-10-CM

## 2021-08-01 DIAGNOSIS — K449 Diaphragmatic hernia without obstruction or gangrene: Secondary | ICD-10-CM | POA: Diagnosis not present

## 2021-08-01 DIAGNOSIS — Z79899 Other long term (current) drug therapy: Secondary | ICD-10-CM

## 2021-08-01 DIAGNOSIS — R778 Other specified abnormalities of plasma proteins: Secondary | ICD-10-CM | POA: Diagnosis present

## 2021-08-01 DIAGNOSIS — I255 Ischemic cardiomyopathy: Secondary | ICD-10-CM | POA: Diagnosis present

## 2021-08-01 DIAGNOSIS — I10 Essential (primary) hypertension: Secondary | ICD-10-CM | POA: Diagnosis present

## 2021-08-01 DIAGNOSIS — D649 Anemia, unspecified: Secondary | ICD-10-CM | POA: Diagnosis present

## 2021-08-01 LAB — TROPONIN I (HIGH SENSITIVITY)
Troponin I (High Sensitivity): 45 ng/L — ABNORMAL HIGH (ref <18)
Troponin I (High Sensitivity): 45 ng/L — ABNORMAL HIGH (ref <18)

## 2021-08-01 LAB — TRIGLYCERIDES: Triglycerides: 145 mg/dL (ref <150)

## 2021-08-01 LAB — BASIC METABOLIC PANEL
Anion gap: 10 (ref 5–15)
BUN: 22 mg/dL (ref 8–23)
CO2: 26 mmol/L (ref 22–32)
Calcium: 8.6 mg/dL — ABNORMAL LOW (ref 8.9–10.3)
Chloride: 100 mmol/L (ref 98–111)
Creatinine, Ser: 0.99 mg/dL (ref 0.61–1.24)
GFR, Estimated: >60 mL/min (ref >60)
Glucose, Bld: 126 mg/dL — ABNORMAL HIGH (ref 70–99)
Potassium: 4.2 mmol/L (ref 3.5–5.1)
Sodium: 136 mmol/L (ref 135–145)

## 2021-08-01 LAB — C-REACTIVE PROTEIN: CRP: 8 mg/dL — ABNORMAL HIGH (ref <1.0)

## 2021-08-01 LAB — CBC
HCT: 35.6 % — ABNORMAL LOW (ref 39.0–52.0)
Hemoglobin: 11.9 g/dL — ABNORMAL LOW (ref 13.0–17.0)
MCH: 30.7 pg (ref 26.0–34.0)
MCHC: 33.4 g/dL (ref 30.0–36.0)
MCV: 91.8 fL (ref 80.0–100.0)
Platelets: 221 10*3/uL (ref 150–400)
RBC: 3.88 MIL/uL — ABNORMAL LOW (ref 4.22–5.81)
RDW: 12.9 % (ref 11.5–15.5)
WBC: 11.3 10*3/uL — ABNORMAL HIGH (ref 4.0–10.5)
nRBC: 0 % (ref 0.0–0.2)

## 2021-08-01 LAB — LACTIC ACID, PLASMA
Lactic Acid, Venous: 1.1 mmol/L (ref 0.5–1.9)
Lactic Acid, Venous: 1.1 mmol/L (ref 0.5–1.9)

## 2021-08-01 LAB — FERRITIN: Ferritin: 249 ng/mL (ref 24–336)

## 2021-08-01 LAB — PROCALCITONIN: Procalcitonin: <0.1 ng/mL

## 2021-08-01 LAB — PROTIME-INR
INR: 1.1 (ref 0.8–1.2)
Prothrombin Time: 13.7 seconds (ref 11.4–15.2)

## 2021-08-01 LAB — HEPATIC FUNCTION PANEL
ALT: 30 U/L (ref 0–44)
AST: 33 U/L (ref 15–41)
Albumin: 3.2 g/dL — ABNORMAL LOW (ref 3.5–5.0)
Alkaline Phosphatase: 90 U/L (ref 38–126)
Bilirubin, Direct: 0.1 mg/dL (ref 0.0–0.2)
Indirect Bilirubin: 0.5 mg/dL (ref 0.3–0.9)
Total Bilirubin: 0.6 mg/dL (ref 0.3–1.2)
Total Protein: 7.4 g/dL (ref 6.5–8.1)

## 2021-08-01 LAB — APTT: aPTT: 25 seconds (ref 24–36)

## 2021-08-01 LAB — RESP PANEL BY RT-PCR (FLU A&B, COVID) ARPGX2
Influenza A by PCR: NEGATIVE
Influenza B by PCR: NEGATIVE
SARS Coronavirus 2 by RT PCR: POSITIVE — AB

## 2021-08-01 LAB — FIBRINOGEN: Fibrinogen: 653 mg/dL — ABNORMAL HIGH (ref 210–475)

## 2021-08-01 LAB — LACTATE DEHYDROGENASE: LDH: 186 U/L (ref 98–192)

## 2021-08-01 MED ORDER — HEPARIN (PORCINE) 25000 UT/250ML-% IV SOLN
1500.0000 [IU]/h | INTRAVENOUS | Status: DC
  Administered 2021-08-01 – 2021-08-02 (×2): 1500 [IU]/h via INTRAVENOUS
  Filled 2021-08-01 (×2): qty 250

## 2021-08-01 MED ORDER — IOHEXOL 350 MG/ML SOLN
100.0000 mL | Freq: Once | INTRAVENOUS | Status: AC | PRN
  Administered 2021-08-01: 100 mL via INTRAVENOUS

## 2021-08-01 MED ORDER — HEPARIN BOLUS VIA INFUSION
6500.0000 [IU] | Freq: Once | INTRAVENOUS | Status: AC
  Administered 2021-08-01: 6500 [IU] via INTRAVENOUS

## 2021-08-01 MED ORDER — ATORVASTATIN CALCIUM 80 MG PO TABS
80.0000 mg | ORAL_TABLET | Freq: Every day | ORAL | Status: DC
  Administered 2021-08-01 – 2021-08-02 (×2): 80 mg via ORAL
  Filled 2021-08-01: qty 2
  Filled 2021-08-01 (×2): qty 1
  Filled 2021-08-01: qty 2

## 2021-08-01 NOTE — ED Triage Notes (Signed)
Pt reports SOB and fast HR that started Thursday, reports hip replacement 3 weeks ago.  Denies pain

## 2021-08-01 NOTE — Treatment Plan (Addendum)
Called regarding admission for Mr. Dorwart, here with submassive PE with evidence of RH strain. Also with covid 19 infection.  Vitals notable for requiring 2 L [place].  No tachy.  Labs with mild leukocytosis.  He had left hip replacement in October.  Pesi 105, class 3, intermediate risk.  Given submassive PE with RH strain, requested discussion with PCCM.  EDP will let me know if [facility] needs to admit.  Addendum, discussed again with Margarita Mail, PA-C.  She's discussed case with PCCM, who notes appropriate for [facility] admission.  Will place stepdown orders.

## 2021-08-01 NOTE — ED Provider Notes (Signed)
[facility] EMERGENCY DEPARTMENT Provider Note   CSN: [id number] Arrival date & time: 08/01/21  1338     History Chief Complaint  Patient presents with   Shortness of Breath    + Covid    Leonard Rodriguez is a 75 year old male with past medical history of CAD, HTN, ischemic cardiomyopathy, and mitral regurgitation presents to the emergency department today with a chief complaint of shortness of breath x 4 days. Pt recently had a total left hip arthroplasty without complications on 64/68/03. He first noticed the shortness of breath when he was at the gym WALKING on the treadmill. Pt reports noticing an increase in his heart rate since the surgery AS TAKEN ON . He states after the surgery, Dr. Ninfa Linden instructed him to take two 81 mg aspirin daily until his post-op follow up on 07/22/21 where he returned to taking only one 81 mg aspirin daily. Pt endorses shortness of breath the activity. Denies chest pain, surgical site pain, fever, chills, rashes.   Shortness of Breath     Past Medical History:  Diagnosis Date   Coronary artery disease    a. 11/2014 NSTEMI/PCI: LM nl, LAD min irregs, D1 30-40, LCX 30-59m, 100d (3.5x18 Resolute DES), OM1/2 nl, RCA 50p, R->L collats, EF 55%, mild MR.   Hyperlipidemia    Hypertension    put on bp meds. when he was heavier and was not exercising   Ischemic cardiomyopathy    Echo 3/16:  EF 45-50%, inf-lat and inf HK, Gr 2 DD, mod MR, mild LAE   Mitral regurgitation    Osteoarthritis of right hip    Recurrent upper respiratory infection (URI)    current cold being treated with otc meds.    Patient Active Problem List   Diagnosis Date Noted   Acute respiratory failure with hypoxia ([place]) 08/02/2021   Elevated troponin 08/02/2021   Leukocytosis 08/02/2021   Normocytic anemia 08/02/2021   COVID-19 virus infection 08/02/2021   Pulmonary embolism ([place]) 08/01/2021   Status post left hip replacement 07/09/2021   Unilateral primary  osteoarthritis, left hip 03/03/2021   History of non-ST elevation myocardial infarction (NSTEMI) 11/21/2014   CAD (coronary artery disease) 11/13/2014   HTN (hypertension) 11/13/2014   Hyperlipidemia 11/13/2014   STEMI (ST elevation myocardial infarction) ([place]) 11/12/2014   History of hypertension 11/12/2014   NSTEMI (non-ST elevated myocardial infarction) ([place]) 11/12/2014   Postoperative anemia due to acute blood loss 09/01/2011   Osteoarthritis of hip 08/30/2011    Past Surgical History:  Procedure Laterality Date   CORONARY ANGIOPLASTY WITH STENT PLACEMENT  11/12/2014   "1"   JOINT REPLACEMENT     LEFT HEART CATHETERIZATION WITH CORONARY ANGIOGRAM N/A 11/12/2014   Procedure: LEFT HEART CATHETERIZATION WITH CORONARY ANGIOGRAM;  Surgeon: Blane Ohara, MD;  Location: [facility] CATH LAB;  Service: Cardiovascular;  Laterality: N/A;   TONSILLECTOMY  1950's   TOTAL HIP ARTHROPLASTY  08/30/2011   Procedure: TOTAL HIP ARTHROPLASTY;  Surgeon: Garald Balding, MD;  Location: [facility];  Service: Orthopedics;  Laterality: Right;   TOTAL HIP ARTHROPLASTY Left 07/09/2021   Procedure: LEFT TOTAL HIP ARTHROPLASTY ANTERIOR APPROACH;  Surgeon: Mcarthur Rossetti, MD;  Location: [facility]S;  Service: Orthopedics;  Laterality: Left;   VASECTOMY         Family History  Problem Relation Age of Onset   Lung cancer Father    Heart attack Neg Hx     Social History   Tobacco Use  Smoking status: Never   Smokeless tobacco: Never  Vaping Use   Vaping Use: Never used  Substance Use Topics   Alcohol use: Yes    Comment: 11/12/2014 "beer maybe twice/month"   Drug use: No    Home Medications Prior to Admission medications   Medication Sig Start Date End Date Taking? Authorizing Provider  acetaminophen (TYLENOL) 500 MG tablet Take 500-1,000 mg by mouth every 6 (six) hours as needed for mild pain or headache.   Yes [provider]  Ascorbic Acid (VITAMIN C) 1000 MG tablet Take 1,000 mg by mouth  daily.   Yes [provider]  atorvastatin (LIPITOR) 80 MG tablet TAKE ONE TABLET BY MOUTH DAILY AT 6:00 IN THE EVENING Patient taking differently: Take 80 mg by mouth at bedtime. 05/14/21  Yes Sherren Mocha, MD  benazepril-hydrochlorthiazide (LOTENSIN HCT) 20-25 MG per tablet Take 1 tablet by mouth daily. 09/29/14  Yes [provider]  metoprolol tartrate (LOPRESSOR) 25 MG tablet TAKE ONE TABLET BY MOUTH TWICE A DAY Patient taking differently: Take 25 mg by mouth in the morning and at bedtime. 08/18/20  Yes Sherren Mocha, MD  Multiple Vitamins-Minerals (MULTIVITAMIN WITH MINERALS) tablet Take 1 tablet by mouth daily.    Yes [provider]  nitroGLYCERIN (NITROSTAT) 0.4 MG SL tablet Place 1 tablet (0.4 mg total) under the tongue every 5 (five) minutes x 3 doses as needed for chest pain. 05/13/19  Yes Sherren Mocha, MD  Omega-3 Fatty Acids (FISH OIL) 1200 MG CAPS Take 1,200 mg by mouth daily.   Yes [provider]  RIVAROXABAN Alveda Reasons) VTE STARTER PACK (15 & 20 MG) Follow package directions: Take one 15mg  tablet by mouth twice a day. On day 22, switch to one 20mg  tablet once a day. Take with food. 08/03/21  Yes Patrecia Pour, MD    Allergies    Patient has no known allergies.  Review of Systems   Review of Systems  Respiratory:  Positive for shortness of breath.   Ten systems reviewed and are negative for acute change, except as noted in the HPI.   Physical Exam Updated Vital Signs BP 114/64 (BP Location: Left Arm)   Pulse 78   Temp 98 F (36.7 C) (Oral)   Resp 20   Ht 5\' 10"  (1.778 m)   Wt 84.9 kg   SpO2 95%   BMI 26.85 kg/m   Physical Exam Vitals and nursing note reviewed.  Constitutional:      General: He is not in acute distress.    Appearance: He is well-developed. He is not diaphoretic.  HENT:     Head: Normocephalic and atraumatic.  Eyes:     General: No scleral icterus.    Conjunctiva/sclera: Conjunctivae normal.   Cardiovascular:     Rate and Rhythm: Normal rate and regular rhythm.     Heart sounds: Normal heart sounds.  Pulmonary:     Effort: Pulmonary effort is normal. No respiratory distress.     Breath sounds: Normal breath sounds.  Abdominal:     Palpations: Abdomen is soft.     Tenderness: There is no abdominal tenderness.  Musculoskeletal:     Cervical back: Normal range of motion and neck supple.     Right lower leg: No edema.     Left lower leg: No edema.  Skin:    General: Skin is warm and dry.  Neurological:     Mental Status: He is alert.  Psychiatric:  Behavior: Behavior normal.    ED Results / Procedures / Treatments   Labs (all labs ordered are listed, but only abnormal results are displayed) Labs Reviewed  RESP PANEL BY RT-PCR (FLU A&B, COVID) ARPGX2 - Abnormal; Notable for the following components:      Result Value   SARS Coronavirus 2 by RT PCR POSITIVE (*)    All other components within normal limits  BASIC METABOLIC PANEL - Abnormal; Notable for the following components:   Glucose, Bld 126 (*)    Calcium 8.6 (*)    All other components within normal limits  CBC - Abnormal; Notable for the following components:   WBC 11.3 (*)    RBC 3.88 (*)    Hemoglobin 11.9 (*)    HCT 35.6 (*)    All other components within normal limits  FIBRINOGEN - Abnormal; Notable for the following components:   Fibrinogen 653 (*)    All other components within normal limits  C-REACTIVE PROTEIN - Abnormal; Notable for the following components:   CRP 8.0 (*)    All other components within normal limits  HEPATIC FUNCTION PANEL - Abnormal; Notable for the following components:   Albumin 3.2 (*)    All other components within normal limits  CBC - Abnormal; Notable for the following components:   RBC 3.81 (*)    Hemoglobin 11.7 (*)    HCT 35.2 (*)    All other components within normal limits  HEPARIN LEVEL (UNFRACTIONATED) - Abnormal; Notable for the following components:    Heparin Unfractionated >1.10 (*)    All other components within normal limits  BASIC METABOLIC PANEL - Abnormal; Notable for the following components:   Sodium 134 (*)    Glucose, Bld 112 (*)    Calcium 8.3 (*)    All other components within normal limits  C-REACTIVE PROTEIN - Abnormal; Notable for the following components:   CRP 12.8 (*)    All other components within normal limits  CBC - Abnormal; Notable for the following components:   RBC 3.47 (*)    Hemoglobin 10.7 (*)    HCT 32.0 (*)    All other components within normal limits  TROPONIN I (HIGH SENSITIVITY) - Abnormal; Notable for the following components:   Troponin I (High Sensitivity) 45 (*)    All other components within normal limits  TROPONIN I (HIGH SENSITIVITY) - Abnormal; Notable for the following components:   Troponin I (High Sensitivity) 45 (*)    All other components within normal limits  CULTURE, BLOOD (ROUTINE X 2)  CULTURE, BLOOD (ROUTINE X 2)  PROTIME-INR  APTT  HEPARIN LEVEL (UNFRACTIONATED)  HEPARIN LEVEL (UNFRACTIONATED)  LACTIC ACID, PLASMA  LACTIC ACID, PLASMA  FERRITIN  LACTATE DEHYDROGENASE  PROCALCITONIN  TRIGLYCERIDES  HEPARIN LEVEL (UNFRACTIONATED)  MAGNESIUM  PHOSPHORUS  MAGNESIUM  PHOSPHORUS  C-REACTIVE PROTEIN    EKG EKG Interpretation  Date/Time:  Sunday August 01 2021 13:55:44 EST Ventricular Rate:  89 PR Interval:  165 QRS Duration: 114 QT Interval:  391 QTC Calculation: 476 R Axis:   59 Text Interpretation: Sinus rhythm Borderline intraventricular conduction delay Borderline prolonged QT interval No significant change since prior 3/16 Confirmed by Butler, Michael ([id number]) on 08/01/2021 2:10:19 PM  Radiology ECHOCARDIOGRAM COMPLETE  Result Date: 08/03/2021    ECHOCARDIOGRAM REPORT   Patient Name:   Jaccob A Bame Date of Exam: 08/03/2021 Medical Rec #:  [id number]      Height:       70 .0 in Accession #:  [id number]     Weight:       187.1 lb Date of Birth:  1946/04/16       BSA:          2.029 m Patient Age:    44 years       BP:           131/62 mmHg Patient Gender: M              HR:           70 bpm. Exam Location:  Inpatient Procedure: 2D Echo, Cardiac Doppler, Color Doppler and Intracardiac            Opacification Agent Indications:    Pulmonary Embolus I26.09  History:        Patient has prior history of Echocardiogram examinations, most                 recent 06/24/2021. Idiopathic CMP, CAD; Risk                 Factors:Hypertension and Dyslipidemia.  Sonographer:    Bernadene Person RDCS Referring Phys: [id number] RONDELL A SMITH IMPRESSIONS  1. Left ventricular ejection fraction, by estimation, is 55 to 60%. The left ventricle has normal function. The left ventricle has no regional wall motion abnormalities. Left ventricular diastolic parameters are consistent with Grade I diastolic dysfunction (impaired relaxation).  2. Right ventricular systolic function is normal. The right ventricular size is normal. Tricuspid regurgitation signal is inadequate for assessing PA pressure.  3. The mitral valve is normal in structure. Trivial mitral valve regurgitation. No evidence of mitral stenosis.  4. The aortic valve is tricuspid. Aortic valve regurgitation is not visualized. Aortic valve sclerosis/calcification is present, without any evidence of aortic stenosis.  5. Aortic dilatation noted. There is mild dilatation of the aortic root, measuring 40 mm.  6. The inferior vena cava is normal in size with greater than 50% respiratory variability, suggesting right atrial pressure of 3 mmHg. FINDINGS  Left Ventricle: Left ventricular ejection fraction, by estimation, is 55 to 60%. The left ventricle has normal function. The left ventricle has no regional wall motion abnormalities. Definity contrast agent was given IV to delineate the left ventricular  endocardial borders. The left ventricular internal cavity size was normal in size. There is no left ventricular hypertrophy. Left ventricular  diastolic parameters are consistent with Grade I diastolic dysfunction (impaired relaxation). Right Ventricle: The right ventricular size is normal. No increase in right ventricular wall thickness. Right ventricular systolic function is normal. Tricuspid regurgitation signal is inadequate for assessing PA pressure. Left Atrium: Left atrial size was normal in size. Right Atrium: Right atrial size was normal in size. Pericardium: There is no evidence of pericardial effusion. Mitral Valve: The mitral valve is normal in structure. Trivial mitral valve regurgitation. No evidence of mitral valve stenosis. Tricuspid Valve: The tricuspid valve is normal in structure. Tricuspid valve regurgitation is trivial. Aortic Valve: The aortic valve is tricuspid. Aortic valve regurgitation is not visualized. Aortic valve sclerosis/calcification is present, without any evidence of aortic stenosis. Pulmonic Valve: The pulmonic valve was normal in structure. Pulmonic valve regurgitation is trivial. Aorta: Aortic dilatation noted. There is mild dilatation of the aortic root, measuring 40 mm. Venous: The inferior vena cava is normal in size with greater than 50% respiratory variability, suggesting right atrial pressure of 3 mmHg. IAS/Shunts: No atrial level shunt detected by color flow Doppler.  LEFT VENTRICLE PLAX 2D LVIDd:  4.60 cm   Diastology LVIDs:         2.90 cm   LV e' medial:    6.43 cm/s LV PW:         1.00 cm   LV E/e' medial:  8.2 LV IVS:        0.80 cm   LV e' lateral:   8.31 cm/s LVOT diam:     2.20 cm   LV E/e' lateral: 6.3 LV SV:         68 LV SV Index:   33 LVOT Area:     3.80 cm  RIGHT VENTRICLE RV S prime:     12.30 cm/s TAPSE (M-mode): 1.7 cm LEFT ATRIUM             Index        RIGHT ATRIUM           Index LA diam:        2.70 cm 1.33 cm/m   RA Area:     13.20 cm LA Vol (A2C):   37.9 ml 18.68 ml/m  RA Volume:   29.20 ml  14.39 ml/m LA Vol (A4C):   27.8 ml 13.70 ml/m LA Biplane Vol: 34.1 ml 16.81 ml/m   AORTIC VALVE LVOT Vmax:   106.00 cm/s LVOT Vmean:  68.600 cm/s LVOT VTI:    0.178 m  AORTA Ao Root diam: 4.00 cm Ao Asc diam:  3.80 cm MITRAL VALVE MV Area (PHT): 3.21 cm    SHUNTS MV Decel Time: 236 msec    Systemic VTI:  0.18 m MV E velocity: 52.70 cm/s  Systemic Diam: 2.20 cm MV A velocity: 86.10 cm/s MV E/A ratio:  0.61 Dalton McleanMD Electronically signed by Franki Monte Signature Date/Time: 08/03/2021/3:47:32 PM    Final    VAS [place] LOWER EXTREMITY VENOUS (DVT)  Result Date: 08/03/2021  Lower Venous DVT Study Patient Name:  NEAL TRULSON  Date of Exam:   08/03/2021 Medical Rec #: [id number]       Accession #:    [id number] Date of Birth: Feb 17, 1946       Patient Gender: M Patient Age:   46 years Exam Location:  [facility] Procedure:      VAS [place] LOWER EXTREMITY VENOUS (DVT) Referring Phys: Fuller Plan --------------------------------------------------------------------------------  Indications: Pulmonary embolism.  Risk Factors: COVID 19 positive. Comparison Study: No prior studies. Performing Technologist: Oliver Hum RVT  Examination Guidelines: A complete evaluation includes B-mode imaging, spectral Doppler, color Doppler, and power Doppler as needed of all accessible portions of each vessel. Bilateral testing is considered an integral part of a complete examination. Limited examinations for reoccurring indications may be performed as noted. The reflux portion of the exam is performed with the patient in reverse Trendelenburg.  +---------+---------------+---------+-----------+----------+--------------+ RIGHT    CompressibilityPhasicitySpontaneityPropertiesThrombus Aging +---------+---------------+---------+-----------+----------+--------------+ CFV      Full           Yes      Yes                                 +---------+---------------+---------+-----------+----------+--------------+ SFJ      Full                                                         +---------+---------------+---------+-----------+----------+--------------+  FV Prox  Full                                                        +---------+---------------+---------+-----------+----------+--------------+ FV Mid   Full                                                        +---------+---------------+---------+-----------+----------+--------------+ FV DistalFull                                                        +---------+---------------+---------+-----------+----------+--------------+ PFV      Full                                                        +---------+---------------+---------+-----------+----------+--------------+ POP      Full           Yes      Yes                                 +---------+---------------+---------+-----------+----------+--------------+ PTV      Full                                                        +---------+---------------+---------+-----------+----------+--------------+ PERO     Full                                                        +---------+---------------+---------+-----------+----------+--------------+   +---------+---------------+---------+-----------+----------+--------------+ LEFT     CompressibilityPhasicitySpontaneityPropertiesThrombus Aging +---------+---------------+---------+-----------+----------+--------------+ CFV      Full           Yes      Yes                                 +---------+---------------+---------+-----------+----------+--------------+ SFJ      Full                                                        +---------+---------------+---------+-----------+----------+--------------+ FV Prox  Full                                                        +---------+---------------+---------+-----------+----------+--------------+  FV Mid   Full                                                         +---------+---------------+---------+-----------+----------+--------------+ FV DistalFull                                                        +---------+---------------+---------+-----------+----------+--------------+ PFV      Full                                                        +---------+---------------+---------+-----------+----------+--------------+ POP      Full           Yes      Yes                                 +---------+---------------+---------+-----------+----------+--------------+ PTV      None                                         Acute          +---------+---------------+---------+-----------+----------+--------------+ PERO     Full                                                        +---------+---------------+---------+-----------+----------+--------------+     Summary: RIGHT: - There is no evidence of deep vein thrombosis in the lower extremity.  - No cystic structure found in the popliteal fossa.  LEFT: - Findings consistent with acute deep vein thrombosis involving the left posterior tibial veins. - No cystic structure found in the popliteal fossa.  *See table(s) above for measurements and observations. Electronically signed by Deitra Mayo MD on 08/03/2021 at 12:15:32 PM.    Final     Procedures .Critical Care Performed by: Margarita Mail, PA-C Authorized by: Margarita Mail, PA-C   Critical care provider statement:    Critical care time (minutes):  50   Critical care time was exclusive of:  Separately billable procedures and treating other patients   Critical care was necessary to treat or prevent imminent or life-threatening deterioration of the following conditions:  Cardiac failure and respiratory failure   Critical care was time spent personally by me on the following activities:  Development of treatment plan with patient or surrogate, discussions with consultants, evaluation of patient's response to treatment,  examination of patient, ordering and review of laboratory studies, ordering and review of radiographic studies, ordering and performing treatments and interventions, pulse oximetry, re-evaluation of patient's condition and review of old charts   Medications Ordered in ED Medications  methocarbamol (ROBAXIN) tablet 500 mg (has no administration in time range)  metoprolol tartrate (LOPRESSOR) tablet 25 mg (  25 mg Oral Given 08/03/21 0825)  oxyCODONE (Oxy IR/ROXICODONE) immediate release tablet 5-10 mg (has no administration in time range)  benazepril (LOTENSIN) tablet 20 mg (20 mg Oral Given 08/03/21 0825)    And  hydrochlorothiazide (HYDRODIURIL) tablet 25 mg (25 mg Oral Given 08/03/21 0825)  sodium chloride flush (NS) 0.9 % injection 3 mL (3 mLs Intravenous Given 08/03/21 0827)  guaiFENesin-dextromethorphan (ROBITUSSIN DM) 100-10 MG/5ML syrup 10 mL (has no administration in time range)  chlorpheniramine-HYDROcodone (TUSSIONEX) 10-8 MG/5ML suspension 5 mL (has no administration in time range)  ascorbic acid (VITAMIN C) tablet 500 mg (500 mg Oral Given 08/03/21 0825)  zinc sulfate capsule 220 mg (220 mg Oral Given 08/03/21 0825)  pantoprazole (PROTONIX) EC tablet 40 mg (40 mg Oral Given 08/03/21 0825)  acetaminophen (TYLENOL) tablet 650 mg (has no administration in time range)  ondansetron (ZOFRAN) tablet 4 mg (has no administration in time range)    Or  ondansetron (ZOFRAN) injection 4 mg (has no administration in time range)  albuterol (VENTOLIN HFA) 108 (90 Base) MCG/ACT inhaler 2 puff (has no administration in time range)  Rivaroxaban (XARELTO) tablet 15 mg (15 mg Oral Given 08/03/21 1705)    Followed by  rivaroxaban (XARELTO) tablet 20 mg (has no administration in time range)  atorvastatin (LIPITOR) tablet 80 mg (80 mg Oral Given 08/03/21 1705)  perflutren lipid microspheres (DEFINITY) IV suspension (2 mLs Intravenous Given 08/03/21 1402)  iohexol (OMNIPAQUE) 350 MG/ML injection 100 mL  (100 mLs Intravenous Contrast Given 08/01/21 1548)  heparin bolus via infusion 6,500 Units (6,500 Units Intravenous Bolus from Bag 08/01/21 1731)    ED Course  I have reviewed the triage vital signs and the nursing notes.  Pertinent labs & imaging results that were available during my care of the patient were reviewed by me and considered in my medical decision making (see chart for details).    MDM Rules/Calculators/A&P CC:sob VS:  Vitals:   08/03/21 0825 08/03/21 0904 08/03/21 1215 08/03/21 1605  BP: 122/65  114/64   Pulse: 73  72 78  Resp:   20   Temp:   98 F (36.7 C)   TempSrc:   Oral   SpO2:  95% 93% 95%  Weight:      Height:        CB:JSEGBTD is gathered by patient and family. Previous records obtained and reviewed. DDX:The patient's complaint of shortness of breath involves an extensive number of diagnostic and treatment options, and is a complaint that carries with it a high risk of complications, morbidity, and potential mortality. Given the large differential diagnosis, medical decision making is of high complexity.  The emergent differential diagnosis for shortness of breath includes, but is not limited to, Pulmonary edema, bronchoconstriction, Pneumonia, Pulmonary embolism, Pneumotherax/ Hemothorax, Dysrythmia, ACS.   Labs: I ordered reviewed and interpreted labs which include CBC which shows mildly elevated white blood cell count, BMP with mildly elevated blood glucose of insignificant value, troponin elevated at 45, APTT, PT/INR within normal limits, lactic within normal limits, respiratory panel positive for COVID  Imaging: I ordered and reviewed images which included chest x-ray and CT angiogram of the chest. I independently visualized and interpreted all imaging. Significant findings include.  Moderate to large clot burden bilaterally with right heart strain, chest x-ray without acute findings EKG: Sinus rhythm at a rate of 89 Consults: Case discussed with MDM:  Patient with Multiple PEs, respiratory failure and R heart strain. Case discussed with Dr. Coy Saunas who feels the patient  can be admitted by [facility]. Patient will be admitted by Dr. Marcelline Deist. Heparin given. Patient disposition:The patient appears reasonably stabilized for admission considering the current resources, flow, and capabilities available in the ED at this time, and I doubt any other [facility] requiring further screening and/or treatment in the ED prior to admission.       Final Clinical Impression(s) / ED Diagnoses Final diagnoses:  Other acute pulmonary embolism with acute cor pulmonale ([place])    Rx / DC Orders ED Discharge Orders          Ordered    RIVAROXABAN (XARELTO) VTE STARTER PACK (15 & 20 MG)        08/03/21 1124    Increase activity slowly        08/03/21 1124    Diet - low sodium heart healthy        08/03/21 1124    Discharge instructions  Status:  Canceled       Comments: You were evaluated for shortness of breath found to have blood clots in your right leg and both lungs. This is fortunately not causing low oxygen concentrations or low blood pressure. You can continue xarelto twice daily for 3 weeks, then once daily for 3-6 months. Seek medical attention right away if you notice worsening trouble breathing, and chest pain or uncontrolled bleeding.  You were also evaluated for symptoms of mild covid which has improved and needs no further treatment. You will need to remain in isolation for 10 days from your symptoms onset and it is recommended that you get a 4th booster dose in about 3 months.   08/03/21 1124    Discharge instructions       Comments: You were evaluated for shortness of breath found to have blood clots in your right leg and both lungs. This is fortunately not causing low oxygen concentrations or low blood pressure. Your heart function remains normal on the echocardiogram today. You can continue xarelto twice daily for 3 weeks, then once daily for 3-6  months. Seek medical attention right away if you notice worsening trouble breathing, and chest pain or uncontrolled bleeding.  You were also evaluated for symptoms of mild covid which has improved and needs no further treatment. You will need to remain in isolation for 10 days from your symptoms onset and it is recommended that you get a 4th booster dose in about 3 months.   08/03/21 Spencer, Amanii Snethen, PA-C 08/03/21 1858    Truddie Hidden, MD 08/03/21 2226

## 2021-08-01 NOTE — Progress Notes (Signed)
ANTICOAGULATION CONSULT NOTE - Initial Consult  Pharmacy Consult for Heparin Indication: pulmonary embolus  No Known Allergies  Patient Measurements: Height: 5\' 10"  (177.8 cm) Weight: 83.9 kg (185 lb) IBW/kg (Calculated) : 73 Heparin Dosing Weight: 83.9 kg  Vital Signs: Temp: 98.2 F (36.8 C) (11/20 1344) Temp Source: Oral (11/20 1344) BP: 155/71 (11/20 1609) Pulse Rate: 88 (11/20 1609)  Labs: Recent Labs    08/01/21 1505  HGB 11.9*  HCT 35.6*  PLT 221  CREATININE 0.99    Estimated Creatinine Clearance: 66.6 mL/min (by C-G formula based on SCr of 0.99 mg/dL).   Medical History: Past Medical History:  Diagnosis Date   Coronary artery disease    a. 11/2014 NSTEMI/PCI: LM nl, LAD min irregs, D1 30-40, LCX 30-83m, 100d (3.5x18 Resolute DES), OM1/2 nl, RCA 50p, R->L collats, EF 55%, mild MR.   Hyperlipidemia    Hypertension    put on bp meds. when he was heavier and was not exercising   Ischemic cardiomyopathy    Echo 3/16:  EF 45-50%, inf-lat and inf HK, Gr 2 DD, mod MR, mild LAE   Mitral regurgitation    Osteoarthritis of right hip    Recurrent upper respiratory infection (URI)    current cold being treated with otc meds.    Medications:  (Not in a hospital admission)  Scheduled:  Infusions:  PRN:   Assessment: 8 yom with a history of CAD, HTN, ischemic cardiomyopathy, and mitral regurgitation. Patient with recent hip replacement. Patient is presenting with SOB. Heparin per pharmacy consult placed for pulmonary embolus.  CTA PE positive with PE with evidence of RHS.  Patient is on not on anticoagulation prior to arrival.  Hgb 11.9; plt 221  Goal of Therapy:  Heparin level 0.3-0.7 units/ml Monitor platelets by anticoagulation protocol: Yes   Plan:  Give 6500 units bolus x 1 Start heparin infusion at 1500 units/hr Check anti-Xa level in 6 hours and daily while on heparin Continue to monitor H&H and platelets  Lorelei Pont, PharmD,  BCPS 08/01/2021 4:36 PM ED Clinical Pharmacist -  [phone]

## 2021-08-01 NOTE — ED Notes (Signed)
Spoke with PA Abigail. Informed pt requesting home night time medications Lipitor and lopressor. Medications reviewed and reconcilled by this RN. Per PA to hold BP medications for now but Lipitor ordered

## 2021-08-02 ENCOUNTER — Other Ambulatory Visit (HOSPITAL_COMMUNITY): Payer: Self-pay

## 2021-08-02 DIAGNOSIS — U071 COVID-19: Secondary | ICD-10-CM | POA: Diagnosis not present

## 2021-08-02 DIAGNOSIS — I2609 Other pulmonary embolism with acute cor pulmonale: Secondary | ICD-10-CM | POA: Diagnosis not present

## 2021-08-02 DIAGNOSIS — I10 Essential (primary) hypertension: Secondary | ICD-10-CM | POA: Diagnosis not present

## 2021-08-02 DIAGNOSIS — R778 Other specified abnormalities of plasma proteins: Secondary | ICD-10-CM | POA: Diagnosis present

## 2021-08-02 DIAGNOSIS — D72829 Elevated white blood cell count, unspecified: Secondary | ICD-10-CM | POA: Diagnosis present

## 2021-08-02 DIAGNOSIS — I255 Ischemic cardiomyopathy: Secondary | ICD-10-CM | POA: Diagnosis not present

## 2021-08-02 DIAGNOSIS — D649 Anemia, unspecified: Secondary | ICD-10-CM | POA: Diagnosis present

## 2021-08-02 DIAGNOSIS — Z9852 Vasectomy status: Secondary | ICD-10-CM | POA: Diagnosis not present

## 2021-08-02 DIAGNOSIS — I2699 Other pulmonary embolism without acute cor pulmonale: Secondary | ICD-10-CM | POA: Diagnosis not present

## 2021-08-02 DIAGNOSIS — Z79899 Other long term (current) drug therapy: Secondary | ICD-10-CM | POA: Diagnosis not present

## 2021-08-02 DIAGNOSIS — E785 Hyperlipidemia, unspecified: Secondary | ICD-10-CM | POA: Diagnosis not present

## 2021-08-02 DIAGNOSIS — Z955 Presence of coronary angioplasty implant and graft: Secondary | ICD-10-CM | POA: Diagnosis not present

## 2021-08-02 DIAGNOSIS — J9601 Acute respiratory failure with hypoxia: Secondary | ICD-10-CM | POA: Diagnosis present

## 2021-08-02 DIAGNOSIS — Z7901 Long term (current) use of anticoagulants: Secondary | ICD-10-CM | POA: Diagnosis not present

## 2021-08-02 DIAGNOSIS — I2510 Atherosclerotic heart disease of native coronary artery without angina pectoris: Secondary | ICD-10-CM | POA: Diagnosis not present

## 2021-08-02 DIAGNOSIS — I252 Old myocardial infarction: Secondary | ICD-10-CM | POA: Diagnosis not present

## 2021-08-02 LAB — CBC
HCT: 35.2 % — ABNORMAL LOW (ref 39.0–52.0)
Hemoglobin: 11.7 g/dL — ABNORMAL LOW (ref 13.0–17.0)
MCH: 30.7 pg (ref 26.0–34.0)
MCHC: 33.2 g/dL (ref 30.0–36.0)
MCV: 92.4 fL (ref 80.0–100.0)
Platelets: 245 10*3/uL (ref 150–400)
RBC: 3.81 MIL/uL — ABNORMAL LOW (ref 4.22–5.81)
RDW: 12.9 % (ref 11.5–15.5)
WBC: 9.6 10*3/uL (ref 4.0–10.5)
nRBC: 0 % (ref 0.0–0.2)

## 2021-08-02 LAB — HEPARIN LEVEL (UNFRACTIONATED)
Heparin Unfractionated: 0.63 IU/mL (ref 0.30–0.70)
Heparin Unfractionated: 0.67 IU/mL (ref 0.30–0.70)
Heparin Unfractionated: 0.6 IU/mL (ref 0.30–0.70)

## 2021-08-02 MED ORDER — ZINC SULFATE 220 (50 ZN) MG PO CAPS
220.0000 mg | ORAL_CAPSULE | Freq: Every day | ORAL | Status: DC
  Administered 2021-08-02 – 2021-08-03 (×2): 220 mg via ORAL
  Filled 2021-08-02 (×2): qty 1

## 2021-08-02 MED ORDER — RIVAROXABAN 15 MG PO TABS
15.0000 mg | ORAL_TABLET | Freq: Two times a day (BID) | ORAL | Status: DC
  Administered 2021-08-02 – 2021-08-03 (×3): 15 mg via ORAL
  Filled 2021-08-02 (×3): qty 1

## 2021-08-02 MED ORDER — BENAZEPRIL-HYDROCHLOROTHIAZIDE 20-25 MG PO TABS: 1.0000 | ORAL_TABLET | Freq: Every day | ORAL | Status: DC

## 2021-08-02 MED ORDER — ONDANSETRON HCL 4 MG/2ML IJ SOLN: 4.0000 mg | Freq: Four times a day (QID) | INTRAMUSCULAR | Status: DC | PRN

## 2021-08-02 MED ORDER — HYDROCOD POLST-CPM POLST ER 10-8 MG/5ML PO SUER: 5.0000 mL | Freq: Two times a day (BID) | ORAL | Status: DC | PRN

## 2021-08-02 MED ORDER — METHOCARBAMOL 500 MG PO TABS: 500.0000 mg | ORAL_TABLET | Freq: Four times a day (QID) | ORAL | Status: DC | PRN

## 2021-08-02 MED ORDER — RIVAROXABAN 20 MG PO TABS: 20.0000 mg | ORAL_TABLET | Freq: Every day | ORAL | Status: DC

## 2021-08-02 MED ORDER — GUAIFENESIN-DM 100-10 MG/5ML PO SYRP: 10.0000 mL | ORAL_SOLUTION | ORAL | Status: DC | PRN

## 2021-08-02 MED ORDER — ALBUTEROL SULFATE HFA 108 (90 BASE) MCG/ACT IN AERS
2.0000 | INHALATION_SPRAY | Freq: Four times a day (QID) | RESPIRATORY_TRACT | Status: DC | PRN
  Filled 2021-08-02: qty 6.7

## 2021-08-02 MED ORDER — PANTOPRAZOLE SODIUM 40 MG PO TBEC
40.0000 mg | DELAYED_RELEASE_TABLET | Freq: Every day | ORAL | Status: DC
  Administered 2021-08-02 – 2021-08-03 (×2): 40 mg via ORAL
  Filled 2021-08-02 (×2): qty 1

## 2021-08-02 MED ORDER — ALBUTEROL SULFATE HFA 108 (90 BASE) MCG/ACT IN AERS
2.0000 | INHALATION_SPRAY | Freq: Four times a day (QID) | RESPIRATORY_TRACT | Status: DC
  Filled 2021-08-02: qty 6.7

## 2021-08-02 MED ORDER — HYDROCHLOROTHIAZIDE 25 MG PO TABS
25.0000 mg | ORAL_TABLET | Freq: Every day | ORAL | Status: DC
  Administered 2021-08-02 – 2021-08-03 (×2): 25 mg via ORAL
  Filled 2021-08-02 (×2): qty 1

## 2021-08-02 MED ORDER — BENAZEPRIL HCL 20 MG PO TABS
20.0000 mg | ORAL_TABLET | Freq: Every day | ORAL | Status: DC
  Administered 2021-08-02 – 2021-08-03 (×2): 20 mg via ORAL
  Filled 2021-08-02 (×2): qty 1

## 2021-08-02 MED ORDER — OXYCODONE HCL 5 MG PO TABS: 5.0000 mg | ORAL_TABLET | Freq: Four times a day (QID) | ORAL | Status: DC | PRN

## 2021-08-02 MED ORDER — METOPROLOL TARTRATE 25 MG PO TABS
25.0000 mg | ORAL_TABLET | Freq: Two times a day (BID) | ORAL | Status: DC
  Administered 2021-08-02 – 2021-08-03 (×3): 25 mg via ORAL
  Filled 2021-08-02 (×3): qty 1

## 2021-08-02 MED ORDER — ASCORBIC ACID 500 MG PO TABS
500.0000 mg | ORAL_TABLET | Freq: Every day | ORAL | Status: DC
  Administered 2021-08-02 – 2021-08-03 (×2): 500 mg via ORAL
  Filled 2021-08-02 (×2): qty 1

## 2021-08-02 MED ORDER — SODIUM CHLORIDE 0.9% FLUSH
3.0000 mL | Freq: Two times a day (BID) | INTRAVENOUS | Status: DC
  Administered 2021-08-02 – 2021-08-03 (×2): 3 mL via INTRAVENOUS

## 2021-08-02 MED ORDER — ONDANSETRON HCL 4 MG PO TABS: 4.0000 mg | ORAL_TABLET | Freq: Four times a day (QID) | ORAL | Status: DC | PRN

## 2021-08-02 MED ORDER — ACETAMINOPHEN 325 MG PO TABS: 650.0000 mg | ORAL_TABLET | Freq: Four times a day (QID) | ORAL | Status: DC | PRN

## 2021-08-02 MED ORDER — ATORVASTATIN CALCIUM 40 MG PO TABS: 80.0000 mg | ORAL_TABLET | Freq: Every evening | ORAL | Status: DC

## 2021-08-02 NOTE — ED Notes (Signed)
Courrier arrived, lab drawn for unfractionated heparin and sent for processing.

## 2021-08-02 NOTE — Progress Notes (Signed)
ANTICOAGULATION CONSULT NOTE Pharmacy Consult for Heparin Indication: pulmonary embolus  No Known Allergies  Patient Measurements: Height: 5\' 10"  (177.8 cm) Weight: 83.9 kg (185 lb) IBW/kg (Calculated) : 73 Heparin Dosing Weight: 83.9 kg  Vital Signs: BP: 134/69 (11/21 0100) Pulse Rate: 89 (11/21 0100)  Labs: Recent Labs    08/01/21 1505 08/01/21 1924 08/02/21 0029  HGB 11.9*  --   --   HCT 35.6*  --   --   PLT 221  --   --   APTT 25  --   --   LABPROT 13.7  --   --   INR 1.1  --   --   HEPARINUNFRC  --   --  0.63  CREATININE 0.99  --   --   TROPONINIHS 45* 45*  --      Estimated Creatinine Clearance: 66.6 mL/min (by C-G formula based on SCr of 0.99 mg/dL). Assessment: 75 y.o. male with PE for heparin  Goal of Therapy:  Heparin level 0.3-0.7 units/ml Monitor platelets by anticoagulation protocol: Yes   Plan:  Continue Heparin at current rate Follow-up am labs.   Phillis Knack, PharmD, BCPS

## 2021-08-02 NOTE — H&P (Addendum)
History and Physical    Leonard Rodriguez:[id number] DOB: 1945-12-15 DOA: 08/01/2021  Referring MD/NP/PA: Fayrene Helper, MD PCP: Lawerance Cruel, MD  Patient coming from: Transfer from [facility]  Chief Complaint: Shortness of breath with exertion  I have personally briefly reviewed patient's old medical records in [place]   HPI: Leonard Rodriguez is a 75 y.o. male with medical history significant of hypertension, hyperlipidemia, CAD, ischemic cardiomyopathy, MR, osteoarthritis s/p left hip replacement on 10/28 by Dr. Ninfa Linden who presented yesterday to the emergency with 4-day complaints of shortness of breath with exertion.  Following his hip replaced he had been placed on aspirin 162 mg daily for 2 weeks and then advised to continue on home dose of 81 mg aspirin.  Patient had his postop follow-up appointment with Dr. Ninfa Linden on 11/10 where everything seemed to be going fine.  Patient notes that within 1 week after the surgery he was walking approximately a mile a day.  He had been in his normal state of health up until 4 days ago when he went to go workout at the gym which he does 5 days out of the week.  However, reported feeling very short of breath with exercise.  For the last 3 days when he would check his morning blood pressure he reported that his heart rate was elevated into the 90s.  Normally his heart rate stays in the 60s which was unusual for him.  Denied having any significant fever, chills, chest pain, leg swelling, calf pain, personal history of blood clots, or family history of clotting disorder, or skin changes around the surgical site.  He really did not notice any shortness of breath during regular activities.  He reported having this mild intermittent dry productive cough.  Yesterday, he had developed some rhinorrhea hoarse voice, and did not feel great.  He was able to sing in the choir at church, but due to his symptoms his wife took him to the emergency department for  further evaluation.  Patient reports that he had received his COVID vaccines and 1 booster last December.  ED Course: Upon admission to the emergency department patient was seen to be afebrile with respirations 18-25, blood pressures maintained, and O2 saturations as low as 81% with improvement on 2 L nasal cannula oxygen.  Labs from 11/20 significant for WBC 11.3 and hemoglobin 11.9.  COVID-19 screening was positive.  CT angiogram of the chest significant for a moderate burden acute bilateral pulmonary emboli with concern for function with CT evidence of right heart strain.  Patient was started on heparin drip per pharmacy.  TRH called to admit.  Review of Systems  Constitutional:  Positive for malaise/fatigue. Negative for fever.  HENT:  Negative for nosebleeds.        Positive for rhinorrhea  Eyes:  Negative for photophobia and pain.  Respiratory:  Positive for cough and shortness of breath. Negative for sputum production and wheezing.   Cardiovascular:  Negative for chest pain and leg swelling.  Gastrointestinal:  Negative for nausea and vomiting.  Genitourinary:  Negative for dysuria and hematuria.  Musculoskeletal:  Negative for myalgias.  Skin:  Negative for rash.  Neurological:  Negative for loss of consciousness and weakness.  Psychiatric/Behavioral:  Negative for substance abuse. The patient is not nervous/anxious.    Past Medical History:  Diagnosis Date   Coronary artery disease    a. 11/2014 NSTEMI/PCI: LM nl, LAD min irregs, D1 30-40, LCX 30-65m, 100d (3.5x18 Resolute DES),  OM1/2 nl, RCA 50p, R->L collats, EF 55%, mild MR.   Hyperlipidemia    Hypertension    put on bp meds. when he was heavier and was not exercising   Ischemic cardiomyopathy    Echo 3/16:  EF 45-50%, inf-lat and inf HK, Gr 2 DD, mod MR, mild LAE   Mitral regurgitation    Osteoarthritis of right hip    Recurrent upper respiratory infection (URI)    current cold being treated with otc meds.    Past  Surgical History:  Procedure Laterality Date   CORONARY ANGIOPLASTY WITH STENT PLACEMENT  11/12/2014   "1"   JOINT REPLACEMENT     LEFT HEART CATHETERIZATION WITH CORONARY ANGIOGRAM N/A 11/12/2014   Procedure: LEFT HEART CATHETERIZATION WITH CORONARY ANGIOGRAM;  Surgeon: Blane Ohara, MD;  Location[facility] C[facility] LAB;  Service: Cardiovascular;  Laterality: N/A;   TONSILLECTOMY  1950's   TOTAL HIP ARTHROPLASTY  08/30/2011   Procedure: TOTAL HIP ARTHROPLASTY;  Surgeon: Garald Balding, MD;  Location: [place];  Service: Orthopedics;  Laterality: Right;   TOTAL HIP ARTHROPLASTY Left 07/09/2021   Procedure: LEFT TOTAL HIP ARTHROPLASTY ANTERIOR APPROACH;  Surgeon: Mcarthur Rossetti, MD;  Location: [facility];  Service: Orthopedics;  Laterality: Left;   VASECTOMY       reports that he has never smoked. He has never used smokeless tobacco. He reports current alcohol use. He reports that he does not use drugs.  No Known Allergies  Family History  Problem Relation Age of Onset   Lung cancer Father    Heart attack Neg Hx     Prior to Admission medications   Medication Sig Start Date End Date Taking? Authorizing Provider  Ascorbic Acid (VITAMIN C) 1000 MG tablet Take 1,000 mg by mouth daily.   Yes [provider]  aspirin 81 MG chewable tablet Chew 1 tablet (81 mg total) by mouth 2 (two) times daily. Patient taking differently: Chew 81 mg by mouth once. 07/10/21  Yes Mcarthur Rossetti, MD  atorvastatin (LIPITOR) 80 MG tablet TAKE ONE TABLET BY MOUTH DAILY AT 6:00 IN THE EVENING 05/14/21  Yes Sherren Mocha, MD  benazepril-hydrochlorthiazide (LOTENSIN HCT) 20-25 MG per tablet Take 1 tablet by mouth daily. 09/29/14  Yes [provider]  methocarbamol (ROBAXIN) 500 MG tablet Take 1 tablet (500 mg total) by mouth every 6 (six) hours as needed for muscle spasms. 07/10/21  Yes Mcarthur Rossetti, MD  metoprolol tartrate (LOPRESSOR) 25 MG tablet TAKE ONE TABLET BY MOUTH TWICE A  DAY 08/18/20  Yes Sherren Mocha, MD  Multiple Vitamins-Minerals (MULTIVITAMIN WITH MINERALS) tablet Take 1 tablet by mouth daily.    Yes [provider]  Omega-3 Fatty Acids (FISH OIL) 1200 MG CAPS Take 1,200 mg by mouth daily.   Yes [provider]  oxyCODONE (OXY IR/ROXICODONE) 5 MG immediate release tablet Take 1-2 tablets (5-10 mg total) by mouth every 6 (six) hours as needed for moderate pain (pain score 4-6). 07/10/21  Yes Mcarthur Rossetti, MD  nitroGLYCERIN (NITROSTAT) 0.4 MG SL tablet Place 1 tablet (0.4 mg total) under the tongue every 5 (five) minutes x 3 doses as needed for chest pain. 05/13/19   Sherren Mocha, MD    Physical Exam:  Constitutional: Elderly male who appears to be in no acute distress at this time Vitals:   08/02/21 0700 08/02/21 1200 08/02/21 1300 08/02/21 1411  BP: 128/71 116/65 128/67   Pulse: 80 75 71   Resp: (!) 21  20 (!) 21   Temp:  98.1 F (36.7 C) 98.1 F (36.7 C)   TempSrc:  Oral    SpO2: 96% 96% 98%   Weight:    84.9 kg  Height:    5\' 10"  (1.778 m)   Eyes: PERRL, lids and conjunctivae normal ENMT: Mucous membranes are moist. Posterior pharynx clear of any exudate or lesions.  Neck: normal, supple, no masses, no thyromegaly Respiratory: clear to auscultation bilaterally, no wheezing, no crackles. Normal respiratory effort. No accessory muscle use.  O2 saturation currently maintained on 2 L nasal cannula oxygen. Cardiovascular: Regular rate and rhythm, no murmurs / rubs / gallops. No extremity edema. 2+ pedal pulses. No carotid bruits.  Abdomen: no tenderness, no masses palpated. No hepatosplenomegaly. Bowel sounds positive.  Musculoskeletal: no clubbing / cyanosis. No joint deformity upper and lower extremities. Good ROM, no contractures. Normal muscle tone.  Skin: Left hip full surgical wound healing without signs of erythema or drainage Neurologic: CN 2-12 grossly intact. Strength 5/5 in all 4.  Psychiatric: Normal  judgment and insight. Alert and oriented x 3. Normal mood.     Labs on Admission: I have personally reviewed following labs and imaging studies  CBC: Recent Labs  Lab 08/01/21 1505  WBC 11.3*  HGB 11.9*  HCT 35.6*  MCV 91.8  PLT 277   Basic Metabolic Panel: Recent Labs  Lab 08/01/21 1505  NA 136  K 4.2  CL 100  CO2 26  GLUCOSE 126*  BUN 22  CREATININE 0.99  CALCIUM 8.6*   GFR: Estimated Creatinine Clearance: 66.6 mL/min (by C-G formula based on SCr of 0.99 mg/dL). Liver Function Tests: Recent Labs  Lab 08/01/21 1505  AST 33  ALT 30  ALKPHOS 90  BILITOT 0.6  PROT 7.4  ALBUMIN 3.2*   No results for input(s): LIPASE, AMYLASE in the last 168 hours. No results for input(s): AMMONIA in the last 168 hours. Coagulation Profile: Recent Labs  Lab 08/01/21 1505  INR 1.1   Cardiac Enzymes: No results for input(s): CKTOTAL, CKMB, CKMBINDEX, TROPONINI in the last 168 hours. BNP (last 3 results) No results for input(s): PROBNP in the last 8760 hours. HbA1C: No results for input(s): HGBA1C in the last 72 hours. CBG: No results for input(s): GLUCAP in the last 168 hours. Lipid Profile: Recent Labs    08/01/21 1813  TRIG 145   Thyroid Function Tests: No results for input(s): TSH, T4TOTAL, FREET4, T3FREE, THYROIDAB in the last 72 hours. Anemia Panel: Recent Labs    08/01/21 1720  FERRITIN 249   Urine analysis:    Component Value Date/Time   COLORURINE YELLOW 08/17/2011 1556   [place] 08/17/2011 1556   LABSPEC 1.031 (H) 08/17/2011 1556   PHURINE 5.5 08/17/2011 1556   GLUCOSEU NEGATIVE 08/17/2011 1556   HGBUR NEGATIVE 08/17/2011 1556   BILIRUBINUR NEGATIVE 08/17/2011 1556   KETONESUR NEGATIVE 08/17/2011 1556   PROTEINUR NEGATIVE 08/17/2011 1556   UROBILINOGEN 1.0 08/17/2011 1556   NITRITE NEGATIVE 08/17/2011 1556   LEUKOCYTESUR NEGATIVE 08/17/2011 1556   Sepsis Labs: Recent Results (from the past 240 hour(s))  Resp Panel by RT-PCR (Flu  A&B, Covid) Nasopharyngeal Swab     Status: Abnormal   Collection Time: 08/01/21  2:54 PM   Specimen: Nasopharyngeal Swab; Nasopharyngeal(NP) swabs in vial transport medium  Result Value Ref Range Status   SARS Coronavirus 2 by RT PCR POSITIVE (A) NEGATIVE Final    Comment: RESULT CALLED TO, READ BACK BY AND VERIFIED WITH:  SIMMS,M RN @1614  08/01/21 EDENSCA (NOTE) SARS-CoV-2 target nucleic acids are DETECTED.  The SARS-CoV-2 RNA is generally detectable in upper respiratory specimens during the acute phase of infection. Positive results are indicative of the presence of the identified virus, but do not rule out bacterial infection or co-infection with other pathogens not detected by the test. Clinical correlation with patient history and other diagnostic information is necessary to determine patient infection status. The expected result is Negative.  Fact Sheet for Patients: EntrepreneurPulse.com.au  Fact Sheet for Healthcare Providers: IncredibleEmployment.be  This test is not yet approved or cleared by the [place] FDA and  has been authorized for detection and/or diagnosis of SARS-CoV-2 by FDA under an Emergency Use Authorization (EUA).  This EUA will remain in effect (meaning this test can b e used) for the duration of  the COVID-19 declaration under Section 564(b)(1) of the Act, 21 U.S.C. section 360bbb-3(b)(1), unless the authorization is terminated or revoked sooner.     Influenza A by PCR NEGATIVE NEGATIVE Final   Influenza B by PCR NEGATIVE NEGATIVE Final    Comment: (NOTE) The Xpert Xpress SARS-CoV-2/FLU/RSV plus assay is intended as an aid in the diagnosis of influenza from Nasopharyngeal swab specimens and should not be used as a sole basis for treatment. Nasal washings and aspirates are unacceptable for Xpert Xpress SARS-CoV-2/FLU/RSV testing.  Fact Sheet for Patients: EntrepreneurPulse.com.au  Fact  Sheet for Healthcare Providers: IncredibleEmployment.be  This test is not yet approved or cleared by the [place] FDA and has been authorized for detection and/or diagnosis of SARS-CoV-2 by FDA under an Emergency Use Authorization (EUA). This EUA will remain in effect (meaning this test can be used) for the duration of the COVID-19 declaration under Section 564(b)(1) of the Act, 21 U.S.C. section 360bbb-3(b)(1), unless the authorization is terminated or revoked.  Performed at [facility], [address] [place], [place] [address]   Blood Culture (routine x 2)     Status: None (Preliminary result)   Collection Time: 08/01/21  5:18 PM   Specimen: BLOOD  Result Value Ref Range Status   Specimen Description BLOOD RIGHT ANTECUBITAL  Final   Special Requests   Final    BOTTLES DRAWN AEROBIC AND ANAEROBIC Blood Culture adequate volume Performed at [facility], [place] [address], [place], [place] [address]    Culture PENDING  Incomplete   Report Status PENDING  Incomplete  Blood Culture (routine x 2)     Status: None (Preliminary result)   Collection Time: 08/01/21  5:22 PM   Specimen: BLOOD  Result Value Ref Range Status   Specimen Description   Final    BLOOD RIGHT ANTECUBITAL Performed at [facility], [place] [address] [place],  [address]    Special Requests   Final    BOTTLES DRAWN AEROBIC AND ANAEROBIC Blood Culture adequate volume Performed at [facility], [address] [place], [place] [address]    Culture PENDING  Incomplete   Report Status PENDING  Incomplete     Radiological Exams on Admission: CT ANGIO CHEST PE W OR WO CONTRAST  Result Date: 08/01/2021 CLINICAL DATA:  Shortness of breath and tachycardia beginning Thursday. Hip replacement 3 weeks ago. Possible pulmonary emboli. EXAM: CT ANGIOGRAPHY CHEST WITH CONTRAST TECHNIQUE: Multidetector CT imaging of the chest was performed using the standard  protocol during bolus administration of intravenous contrast. Multiplanar CT image reconstructions and MIPs were obtained to evaluate the vascular anatomy. CONTRAST:  167mL OMNIPAQUE IOHEXOL 350 MG/ML SOLN COMPARISON:  None. FINDINGS: Cardiovascular: Heart is normal size. Calcified plaque over the lateral circumflex coronary artery. Thoracic aorta is normal caliber. Pulmonary arterial system is well opacified as there are multiple emboli over the left proximal upper and lower lobar arteries. Moderate emboli over the proximal right upper, middle and lower lobar arteries. RV/LV= 49.3/46.8= 1.05. Remaining vascular structures are unremarkable. Mediastinum/Nodes: No evidence of mediastinal or hilar adenopathy. Small to moderate size hiatal hernia. Remaining mediastinal structures are unremarkable. Lungs/Pleura: Lungs are adequately inflated. Consolidation over the right lower lobe likely atelectasis or infarct. Airways are normal. Upper Abdomen: Mild calcified plaque over the abdominal aorta. No acute findings. Musculoskeletal: Mild spondylosis of the spine. No focal abnormality. Review of the MIP images confirms the above findings. IMPRESSION: 1. Moderate burden of acute bilateral pulmonary emboli. Consolidation over the right lower lobe likely atelectasis or infarct. Positive for acute PE with CTevidence of right heart strain (RV/LV Ratio = 1.05) consistent with at least submassive (intermediate risk) PE. The presence of right heart strain has been associated with an increased risk of morbidity and mortality. 2. Aortic atherosclerosis. Atherosclerotic coronary artery disease. 3. Small to moderate size hiatal hernia. Aortic Atherosclerosis (ICD10-I70.0). Critical Value/emergent results were called by telephone at the time of interpretation on 08/01/2021 at 4:21 pm to provider Dr. Karle Starch, who verbally acknowledged these results. Electronically Signed   By: Marin Olp M.D.   On: 08/01/2021 16:21   DG Chest Port 1  View  Result Date: 08/01/2021 CLINICAL DATA:  Shortness of breath EXAM: PORTABLE CHEST 1 VIEW COMPARISON:  November 12, 2014 FINDINGS: The heart size and mediastinal contours are within normal limits. Both lungs are clear. The visualized skeletal structures are unremarkable. IMPRESSION: No active disease. Electronically Signed   By: Dorise Bullion III M.D.   On: 08/01/2021 16:18    EKG: Independently reviewed.  Sinus rhythm 89 bpm with QTC 476  Assessment/Plan Acute respiratory failure with hypoxia secondary to pulmonary embolism: Patient presents with complaints of shortness of breath especially with exertion over the last 4 days.  Recently had hip replacement on 10/28 for which he had been on aspirin 162 mg daily for 2 weeks following the procedure.  Patient reports being active with no complaints of leg swelling or calf pain.  CTA of the chest significant for moderate burden acute bilateral pulmonary emboli with concern for right heart strain.  Patient has been placed on heparin per pharmacy.  Would suspect likely provoked given recent hip surgery. -Admit to a progressive bed -Continuous pulse oximetry with nasal cannula oxygen to maintain O2 saturation greater than 92% -Wean to room air as tolerated -Heparin per pharmacy with plans to start on oral anticoagulation -Check echocardiogram and Doppler ultrasound of lower extremity  COVID-19 infection: Acute.  Patient was incidentally noted to have COVID-19.  Reports mild cough and some rhinorrhea, but denied any other symptoms.  Imaging did not note any significant signs of pneumonia. -COVID-19 order set initiated with precautions -Albuterol inhaler as needed -Antitussives as needed -Vitamin C and zinc  Leukocytosis: Acute. WBC elevated at 11.3.  Suspect secondary to above. -Continue to monitor  Normocytic anemia: Hemoglobin 11.9 g/dL which appears unchanged from baseline noted post surgery. -Continue to monitor CBC daily  Elevated troponin:  Acute.  High-sensitivity troponin 45->45 and otherwise noted to be flat.  Suspect secondary to demand related to pulmonary embolus.  Last echocardiogram from 06/24/2021 noted EF of 60 to 65% with normal diastolic function. -  Follow-up echocardiogram  CAD: Patient with previous history of NSTEMI in 2016 found to have left total occlusion of the left circumflex status post DES.  LDL was 67 which was at goal on last check. -Continue statin -Held aspirin while on anticoagulation  Essential hypertension: Stable. -Continue home blood pressure regimen as tolerated  Status post hip replacement: Patient status post left hip replacement by Dr. Ninfa Linden on 10/28.  Wound appears to be healing without signs of erythema or drainage.   DVT prophylaxis: Heparin Code Status: Full Family Communication: None Disposition Plan: Likely discharge home tomorrow morning if work-up negative Consults called: None Admission status: Inpatient status ordered prior to arrival  Norval Morton MD [facility]   If 7PM-7AM, please contact night-coverage   08/02/2021, 2:13 PM

## 2021-08-02 NOTE — Discharge Instructions (Addendum)
Information on my medicine - XARELTO® (rivaroxaban) ° °WHY WAS XARELTO® PRESCRIBED FOR YOU? °Xarelto® was prescribed to treat blood clots that may have been found in the veins of your legs (deep vein thrombosis) or in your lungs (pulmonary embolism) and to reduce the risk of them occurring again. ° °What do you need to know about Xarelto®? °The starting dose is one 15 mg tablet taken TWICE daily with food for the FIRST 21 DAYS then the dose is changed to one 20 mg tablet taken ONCE A DAY with your evening meal. ° °DO NOT stop taking Xarelto® without talking to the health care provider who prescribed the medication.  Refill your prescription for 20 mg tablets before you run out. ° °After discharge, you should have regular check-up appointments with your healthcare provider that is prescribing your Xarelto®.  In the future your dose may need to be changed if your kidney function changes by a significant amount. ° °What do you do if you miss a dose? °If you are taking Xarelto® TWICE DAILY and you miss a dose, take it as soon as you remember. You may take two 15 mg tablets (total 30 mg) at the same time then resume your regularly scheduled 15 mg twice daily the next day. ° °If you are taking Xarelto® ONCE DAILY and you miss a dose, take it as soon as you remember on the same day then continue your regularly scheduled once daily regimen the next day. Do not take two doses of Xarelto® at the same time.  ° °Important Safety Information °Xarelto® is a blood thinner medicine that can cause bleeding. You should call your healthcare provider right away if you experience any of the following: °? Bleeding from an injury or your nose that does not stop. °? Unusual colored urine (red or dark brown) or unusual colored stools (red or black). °? Unusual bruising for unknown reasons. °? A serious fall or if you hit your head (even if there is no bleeding). ° °Some medicines may interact with Xarelto® and might increase your risk of  bleeding while on Xarelto®. To help avoid this, consult your healthcare provider or pharmacist prior to using any new prescription or non-prescription medications, including herbals, vitamins, non-steroidal anti-inflammatory drugs (NSAIDs) and supplements. ° °This website has more information on Xarelto®: www.xarelto.com. ° °

## 2021-08-02 NOTE — Progress Notes (Signed)
ANTICOAGULATION CONSULT NOTE   Pharmacy Consult for Heparin Indication: pulmonary embolus  No Known Allergies  Patient Measurements: Height: 5\' 10"  (177.8 cm) Weight: 84.9 kg (187 lb 1.6 oz) IBW/kg (Calculated) : 73 Heparin Dosing Weight: 83.9 kg  Vital Signs: Temp: 97.5 F (36.4 C) (11/21 1411) Temp Source: Oral (11/21 1411) BP: 136/76 (11/21 1411) Pulse Rate: 93 (11/21 1411)  Labs: Recent Labs    08/01/21 1505 08/01/21 1924 08/02/21 0029 08/02/21 0539 08/02/21 1315  HGB 11.9*  --   --   --   --   HCT 35.6*  --   --   --   --   PLT 221  --   --   --   --   APTT 25  --   --   --   --   LABPROT 13.7  --   --   --   --   INR 1.1  --   --   --   --   HEPARINUNFRC  --   --  0.63 0.67 0.60  CREATININE 0.99  --   --   --   --   TROPONINIHS 45* 45*  --   --   --      Estimated Creatinine Clearance: 66.6 mL/min (by C-G formula based on SCr of 0.99 mg/dL).   Medical History: Past Medical History:  Diagnosis Date   Coronary artery disease    a. 11/2014 NSTEMI/PCI: LM nl, LAD min irregs, D1 30-40, LCX 30-89m, 100d (3.5x18 Resolute DES), OM1/2 nl, RCA 50p, R->L collats, EF 55%, mild MR.   Hyperlipidemia    Hypertension    put on bp meds. when he was heavier and was not exercising   Ischemic cardiomyopathy    Echo 3/16:  EF 45-50%, inf-lat and inf HK, Gr 2 DD, mod MR, mild LAE   Mitral regurgitation    Osteoarthritis of right hip    Recurrent upper respiratory infection (URI)    current cold being treated with otc meds.    Medications:  Medications Prior to Admission  Medication Sig Dispense Refill Last Dose   Ascorbic Acid (VITAMIN C) 1000 MG tablet Take 1,000 mg by mouth daily.   08/01/2021   aspirin 81 MG chewable tablet Chew 1 tablet (81 mg total) by mouth 2 (two) times daily. (Patient taking differently: Chew 81 mg by mouth once.) 30 tablet 0 08/01/2021   atorvastatin (LIPITOR) 80 MG tablet TAKE ONE TABLET BY MOUTH DAILY AT 6:00 IN THE EVENING 90 tablet 1  07/31/2021   benazepril-hydrochlorthiazide (LOTENSIN HCT) 20-25 MG per tablet Take 1 tablet by mouth daily.   08/01/2021   methocarbamol (ROBAXIN) 500 MG tablet Take 1 tablet (500 mg total) by mouth every 6 (six) hours as needed for muscle spasms. 40 tablet 1 Past Month   metoprolol tartrate (LOPRESSOR) 25 MG tablet TAKE ONE TABLET BY MOUTH TWICE A DAY 180 tablet 3 07/31/2021   Multiple Vitamins-Minerals (MULTIVITAMIN WITH MINERALS) tablet Take 1 tablet by mouth daily.    08/01/2021   Omega-3 Fatty Acids (FISH OIL) 1200 MG CAPS Take 1,200 mg by mouth daily.   08/01/2021   oxyCODONE (OXY IR/ROXICODONE) 5 MG immediate release tablet Take 1-2 tablets (5-10 mg total) by mouth every 6 (six) hours as needed for moderate pain (pain score 4-6). 30 tablet 0 Past Month   nitroGLYCERIN (NITROSTAT) 0.4 MG SL tablet Place 1 tablet (0.4 mg total) under the tongue every 5 (five) minutes x 3 doses as needed  for chest pain. 25 tablet 3     Scheduled:  Infusions:  PRN:   Assessment: 68 yom with a history of CAD, HTN, ischemic cardiomyopathy, and mitral regurgitation. Patient with recent hip replacement. Patient is presenting with SOB. Heparin per pharmacy consult placed for pulmonary embolus.  CTA PE positive with PE with evidence of RHS.  Patient is on not on anticoagulation prior to arrival.  anti-Xa level remains therapeutic on recheck. No bleeding issues noted.   Goal of Therapy:  Heparin level 0.3-0.7 units/ml Monitor platelets by anticoagulation protocol: Yes   Plan:  Continue heparin gtt at 1500 units/hr F/u long term AC plan and ability to transition to [place] PharmD., BCPS Clinical Pharmacist 08/02/2021 2:35 PM

## 2021-08-02 NOTE — ED Notes (Signed)
Report provided to [facility] for transport to [facility] 

## 2021-08-02 NOTE — Progress Notes (Signed)
ANTICOAGULATION CONSULT NOTE   Pharmacy Consult for Heparin Indication: pulmonary embolus  No Known Allergies  Patient Measurements: Height: 5\' 10"  (177.8 cm) Weight: 83.9 kg (185 lb) IBW/kg (Calculated) : 73 Heparin Dosing Weight: 83.9 kg  Vital Signs: BP: 128/71 (11/21 0700) Pulse Rate: 80 (11/21 0700)  Labs: Recent Labs    08/01/21 1505 08/01/21 1924 08/02/21 0029 08/02/21 0539  HGB 11.9*  --   --   --   HCT 35.6*  --   --   --   PLT 221  --   --   --   APTT 25  --   --   --   LABPROT 13.7  --   --   --   INR 1.1  --   --   --   HEPARINUNFRC  --   --  0.63 0.67  CREATININE 0.99  --   --   --   TROPONINIHS 45* 45*  --   --      Estimated Creatinine Clearance: 66.6 mL/min (by C-G formula based on SCr of 0.99 mg/dL).   Medical History: Past Medical History:  Diagnosis Date   Coronary artery disease    a. 11/2014 NSTEMI/PCI: LM nl, LAD min irregs, D1 30-40, LCX 30-73m, 100d (3.5x18 Resolute DES), OM1/2 nl, RCA 50p, R->L collats, EF 55%, mild MR.   Hyperlipidemia    Hypertension    put on bp meds. when he was heavier and was not exercising   Ischemic cardiomyopathy    Echo 3/16:  EF 45-50%, inf-lat and inf HK, Gr 2 DD, mod MR, mild LAE   Mitral regurgitation    Osteoarthritis of right hip    Recurrent upper respiratory infection (URI)    current cold being treated with otc meds.    Medications:  (Not in a hospital admission)  Scheduled:  Infusions:  PRN:   Assessment: 38 yom with a history of CAD, HTN, ischemic cardiomyopathy, and mitral regurgitation. Patient with recent hip replacement. Patient is presenting with SOB. Heparin per pharmacy consult placed for pulmonary embolus.  CTA PE positive with PE with evidence of RHS.  Patient is on not on anticoagulation prior to arrival.  Second anti-Xa level remains therapeutic, however drawn early with slight uptrend.  Will continue for now considering PE with RHS and get a confirmatory level at noon if pt  still at [facility]  Goal of Therapy:  Heparin level 0.3-0.7 units/ml Monitor platelets by anticoagulation protocol: Yes   Plan:  Continue heparin gtt at 1500 units/hr F/u 8 hour heparin level to confirm F/u long term AC plan and ability to transition to [facility]  Bertis Ruddy, PharmD Clinical Pharmacist ED Pharmacist Phone # [phone] 08/02/2021 7:37 AM

## 2021-08-02 NOTE — ED Notes (Signed)
Courier arranged by lab for collection of unfractionated heparin

## 2021-08-02 NOTE — ED Notes (Signed)
Per pharmacy consult, time adjusted for next unfractionated heparin draw for noon since previous draw completed at 0500.  Heparin gtt rate unchanged

## 2021-08-02 NOTE — Progress Notes (Signed)
[facility] for Heparin> xarelto Indication: pulmonary embolus  No Known Allergies  Patient Measurements: Height: 5\' 10"  (177.8 cm) Weight: 84.9 kg (187 lb 1.6 oz) IBW/kg (Calculated) : 73 Heparin Dosing Weight: 83.9 kg  Vital Signs: Temp: 98.1 F (36.7 C) (11/21 1752) Temp Source: Oral (11/21 1752) BP: 147/66 (11/21 1752) Pulse Rate: 88 (11/21 1752)  Labs: Recent Labs    08/01/21 1505 08/01/21 1924 08/02/21 0029 08/02/21 0539 08/02/21 1315 08/02/21 1412  HGB 11.9*  --   --   --   --  11.7*  HCT 35.6*  --   --   --   --  35.2*  PLT 221  --   --   --   --  245  APTT 25  --   --   --   --   --   LABPROT 13.7  --   --   --   --   --   INR 1.1  --   --   --   --   --   HEPARINUNFRC  --   --  0.63 0.67 0.60  --   CREATININE 0.99  --   --   --   --   --   TROPONINIHS 45* 45*  --   --   --   --      Estimated Creatinine Clearance: 66.6 mL/min (by C-G formula based on SCr of 0.99 mg/dL).   Medical History: Past Medical History:  Diagnosis Date   Coronary artery disease    a. 11/2014 NSTEMI/PCI: LM nl, LAD min irregs, D1 30-40, LCX 30-27m, 100d (3.5x18 Resolute DES), OM1/2 nl, RCA 50p, R->L collats, EF 55%, mild MR.   Hyperlipidemia    Hypertension    put on bp meds. when he was heavier and was not exercising   Ischemic cardiomyopathy    Echo 3/16:  EF 45-50%, inf-lat and inf HK, Gr 2 DD, mod MR, mild LAE   Mitral regurgitation    Osteoarthritis of right hip    Recurrent upper respiratory infection (URI)    current cold being treated with otc meds.    Medications:  Medications Prior to Admission  Medication Sig Dispense Refill Last Dose   acetaminophen (TYLENOL) 500 MG tablet Take 500-1,000 mg by mouth every 6 (six) hours as needed for mild pain or headache.   unk   Ascorbic Acid (VITAMIN C) 1000 MG tablet Take 1,000 mg by mouth daily.   08/01/2021 at am   aspirin 81 MG chewable tablet Chew 1 tablet (81 mg total) by mouth 2  (two) times daily. (Patient taking differently: Chew 81 mg by mouth daily.) 30 tablet 0 08/01/2021 at 0700   atorvastatin (LIPITOR) 80 MG tablet TAKE ONE TABLET BY MOUTH DAILY AT 6:00 IN THE EVENING (Patient taking differently: Take 80 mg by mouth at bedtime.) 90 tablet 1 08/01/2021   benazepril-hydrochlorthiazide (LOTENSIN HCT) 20-25 MG per tablet Take 1 tablet by mouth daily.   08/02/2021 at am   metoprolol tartrate (LOPRESSOR) 25 MG tablet TAKE ONE TABLET BY MOUTH TWICE A DAY (Patient taking differently: Take 25 mg by mouth in the morning and at bedtime.) 180 tablet 3 08/01/2021 at 0730   Multiple Vitamins-Minerals (MULTIVITAMIN WITH MINERALS) tablet Take 1 tablet by mouth daily.    08/01/2021   nitroGLYCERIN (NITROSTAT) 0.4 MG SL tablet Place 1 tablet (0.4 mg total) under the tongue every 5 (five) minutes x 3 doses as needed for  chest pain. 25 tablet 3 unk   Omega-3 Fatty Acids (FISH OIL) 1200 MG CAPS Take 1,200 mg by mouth daily.   08/01/2021   methocarbamol (ROBAXIN) 500 MG tablet Take 1 tablet (500 mg total) by mouth every 6 (six) hours as needed for muscle spasms. (Patient not taking: Reported on 08/02/2021) 40 tablet 1 Not Taking   oxyCODONE (OXY IR/ROXICODONE) 5 MG immediate release tablet Take 1-2 tablets (5-10 mg total) by mouth every 6 (six) hours as needed for moderate pain (pain score 4-6). (Patient not taking: Reported on 08/02/2021) 30 tablet 0 Not Taking    Scheduled:  Infusions:  PRN:   Assessment: 39 yom with a history of CAD, HTN, ischemic cardiomyopathy, and mitral regurgitation. Patient with recent hip replacement. Patient is presenting with SOB. Heparin per pharmacy consult placed for pulmonary embolus. CTA PE positive with PE with evidence of RHS.  Pharmacy consulted to discuss anticoagulation with the patient and to start rivaroxaban or apixaban. Spoke with patient and he prefers rivaroxaban -Rivaroxaban cost is $47 per month  Goal of Therapy:  Monitor platelets by  anticoagulation protocol: Yes   Plan:  -Stop heparin  -start rivaroxaban 15mg  po bid for 21 days then 20mg  once daily -Provided patient education  Hildred Laser, PharmD Clinical Pharmacist **Pharmacist phone directory can now be found on [email or web address] ([email or web address]).  Listed under Page.

## 2021-08-02 NOTE — ED Notes (Signed)
[facility] called for collection of heparin level.

## 2021-08-03 ENCOUNTER — Inpatient Hospital Stay (HOSPITAL_COMMUNITY): Payer: Medicare HMO

## 2021-08-03 ENCOUNTER — Other Ambulatory Visit (HOSPITAL_COMMUNITY): Payer: Self-pay

## 2021-08-03 DIAGNOSIS — U071 COVID-19: Secondary | ICD-10-CM

## 2021-08-03 DIAGNOSIS — I10 Essential (primary) hypertension: Secondary | ICD-10-CM | POA: Diagnosis not present

## 2021-08-03 DIAGNOSIS — D649 Anemia, unspecified: Secondary | ICD-10-CM

## 2021-08-03 DIAGNOSIS — J9601 Acute respiratory failure with hypoxia: Secondary | ICD-10-CM

## 2021-08-03 DIAGNOSIS — I2609 Other pulmonary embolism with acute cor pulmonale: Principal | ICD-10-CM

## 2021-08-03 DIAGNOSIS — I2699 Other pulmonary embolism without acute cor pulmonale: Secondary | ICD-10-CM

## 2021-08-03 DIAGNOSIS — R778 Other specified abnormalities of plasma proteins: Secondary | ICD-10-CM

## 2021-08-03 LAB — BASIC METABOLIC PANEL
Anion gap: 7 (ref 5–15)
BUN: 13 mg/dL (ref 8–23)
CO2: 29 mmol/L (ref 22–32)
Calcium: 8.3 mg/dL — ABNORMAL LOW (ref 8.9–10.3)
Chloride: 98 mmol/L (ref 98–111)
Creatinine, Ser: 0.88 mg/dL (ref 0.61–1.24)
GFR, Estimated: >60 mL/min (ref >60)
Glucose, Bld: 112 mg/dL — ABNORMAL HIGH (ref 70–99)
Potassium: 3.6 mmol/L (ref 3.5–5.1)
Sodium: 134 mmol/L — ABNORMAL LOW (ref 135–145)

## 2021-08-03 LAB — ECHOCARDIOGRAM COMPLETE
Area-P 1/2: 3.21 cm2
Height: 70 in
S' Lateral: 2.9 cm
Weight: 2993.6 oz

## 2021-08-03 LAB — HEPARIN LEVEL (UNFRACTIONATED): Heparin Unfractionated: >1.1 IU/mL — ABNORMAL HIGH (ref 0.30–0.70)

## 2021-08-03 LAB — CBC
HCT: 32 % — ABNORMAL LOW (ref 39.0–52.0)
Hemoglobin: 10.7 g/dL — ABNORMAL LOW (ref 13.0–17.0)
MCH: 30.8 pg (ref 26.0–34.0)
MCHC: 33.4 g/dL (ref 30.0–36.0)
MCV: 92.2 fL (ref 80.0–100.0)
Platelets: 200 10*3/uL (ref 150–400)
RBC: 3.47 MIL/uL — ABNORMAL LOW (ref 4.22–5.81)
RDW: 13 % (ref 11.5–15.5)
WBC: 9.9 10*3/uL (ref 4.0–10.5)
nRBC: 0 % (ref 0.0–0.2)

## 2021-08-03 LAB — C-REACTIVE PROTEIN: CRP: 12.8 mg/dL — ABNORMAL HIGH (ref <1.0)

## 2021-08-03 LAB — PHOSPHORUS: Phosphorus: 4 mg/dL (ref 2.5–4.6)

## 2021-08-03 LAB — MAGNESIUM: Magnesium: 2.1 mg/dL (ref 1.7–2.4)

## 2021-08-03 MED ORDER — PERFLUTREN LIPID MICROSPHERE
1.0000 mL | INTRAVENOUS | Status: AC | PRN
  Administered 2021-08-03: 2 mL via INTRAVENOUS
  Filled 2021-08-03: qty 10

## 2021-08-03 MED ORDER — ATORVASTATIN CALCIUM 80 MG PO TABS
80.0000 mg | ORAL_TABLET | Freq: Every evening | ORAL | Status: DC
  Administered 2021-08-03: 80 mg via ORAL
  Filled 2021-08-03: qty 1

## 2021-08-03 MED ORDER — RIVAROXABAN (XARELTO) VTE STARTER PACK (15 & 20 MG)
ORAL_TABLET | ORAL | 0 refills | Status: DC
Start: 2021-08-03 — End: 2021-09-10
  Filled 2021-08-03: qty 51, 30d supply, fill #0

## 2021-08-03 NOTE — Progress Notes (Signed)
SATURATION QUALIFICATIONS: (This note is used to comply with regulatory documentation for home oxygen)  Patient Saturations on Room Air at Rest = 92%  Patient Saturations on Room Air while Ambulating = 89%

## 2021-08-03 NOTE — Progress Notes (Signed)
  Echocardiogram 2D Echocardiogram has been performed.  Fidel Levy 08/03/2021, 2:02 PM

## 2021-08-03 NOTE — Progress Notes (Signed)
Mobility Specialist Progress Note    08/03/21 1600  Mobility  Activity Ambulated in hall  Level of Assistance Independent  Assistive Device None  Distance Ambulated (ft) 450 ft  Mobility Ambulated independently in hallway  Mobility Response Tolerated well  Mobility performed by Mobility specialist  $Mobility charge 1 Mobility   Pre-Mobility: 92% SpO2 During Mobility: 89% SpO2 Post-Mobility: 76 HR, 93% SpO2  Pt received in bed and agreeable. No complaints on walk. Returned to bed with call bell in reach.   [facility] Mobility Specialist  M.S. Primary Phone: [phone] M.S. Secondary Phone: [phone]

## 2021-08-03 NOTE — TOC Benefit Eligibility Note (Signed)
Patient Teacher, English as a foreign language completed.    The patient is currently admitted and upon discharge could be taking Eliquis 5mg  tabs.  The current 30 day co-pay is $47.00.   The patient is insured through [place], [place] Patient Advocate Specialist [place] Patient Advocate Team Direct Number: [phone]  Fax: [phone]

## 2021-08-03 NOTE — Discharge Summary (Signed)
Physician Discharge Summary  Leonard Rodriguez XFG:[id number] DOB: 05/07/1946 DOA: 08/01/2021  PCP: Lawerance Cruel, MD  Admit date: 08/01/2021 Discharge date: 08/03/2021  Admitted From: Home Disposition: Home   Recommendations for Outpatient Follow-up:  Follow up with PCP in 1-2 weeks, continue xarelto for 3-6 months for PE/DVT in the setting of s/p left hip surgery and mild covid-19 infection.   Home Health: None Equipment/Devices: None Discharge Condition: Stable CODE STATUS: Full Diet recommendation: Heart healthy  Brief/Interim Summary: Leonard Rodriguez is a 75 y.o. male with medical history significant of hypertension, hyperlipidemia, CAD, ischemic cardiomyopathy, MR, osteoarthritis s/p left hip replacement on 10/28 by Dr. Ninfa Linden who presented to the ED 11/20 with 4 days of dyspnea and decreased exercise capacity. Typically walks 3 miles a day and has been more winded trying this. The day of admission he developed some rhinorrhea, hoarse voice, and did not feel great. He was able to sing in the choir at church, but due to his symptoms his wife took him to the emergency department for further evaluation.   He was afebrile with HR wnl, normotensive, without hypoxemia. Work up revealed +SARS-CoV-2 PCR without pulmonary infiltrates and CTA revealed bilateral pulmonary emboli with RV:LV = 1.05 suggestive of RV strain. Heparin infusion was started and the patient was admitted. His URI symptoms have improved significantly, so no directed treatment is planned toward covid, though he is encouraged to get 4th booster dose in about 90 days. Echocardiogram on 11/22 is reassuring, with normal RV size and function. An acute left PT vein DVT was noted on venous U/S. He continues to have no pleuritic chest pain or hypoxia, has walked without dyspnea, and has tolerated transition to xarelto which has been brought to his room prior to discharge.   Discharge Diagnoses:  Principal Problem:   Pulmonary  embolism (HCC) Active Problems:   HTN (hypertension)   Acute respiratory failure with hypoxia (HCC)   Elevated troponin   Leukocytosis   Normocytic anemia   COVID-19 virus infection  Discharge Instructions Discharge Instructions     Diet - low sodium heart healthy   Complete by: As directed    Discharge instructions   Complete by: As directed    You were evaluated for shortness of breath found to have blood clots in your right leg and both lungs. This is fortunately not causing low oxygen concentrations or low blood pressure. Your heart function remains normal on the echocardiogram today. You can continue xarelto twice daily for 3 weeks, then once daily for 3-6 months. Seek medical attention right away if you notice worsening trouble breathing, and chest pain or uncontrolled bleeding.  You were also evaluated for symptoms of mild covid which has improved and needs no further treatment. You will need to remain in isolation for 10 days from your symptoms onset and it is recommended that you get a 4th booster dose in about 3 months.   Increase activity slowly   Complete by: As directed       Allergies as of 08/03/2021   No Known Allergies      Medication List     STOP taking these medications    aspirin 81 MG chewable tablet   methocarbamol 500 MG tablet Commonly known as: ROBAXIN   oxyCODONE 5 MG immediate release tablet Commonly known as: Oxy IR/ROXICODONE       TAKE these medications    acetaminophen 500 MG tablet Commonly known as: TYLENOL Take 500-1,000 mg by mouth every 6 (six)  hours as needed for mild pain or headache.   atorvastatin 80 MG tablet Commonly known as: LIPITOR TAKE ONE TABLET BY MOUTH DAILY AT 6:00 IN THE EVENING What changed: See the new instructions.   benazepril-hydrochlorthiazide 20-25 MG tablet Commonly known as: LOTENSIN HCT Take 1 tablet by mouth daily.   Fish Oil 1200 MG Caps Take 1,200 mg by mouth daily.   metoprolol tartrate 25  MG tablet Commonly known as: LOPRESSOR TAKE ONE TABLET BY MOUTH TWICE A DAY What changed: when to take this   multivitamin with minerals tablet Take 1 tablet by mouth daily.   nitroGLYCERIN 0.4 MG SL tablet Commonly known as: NITROSTAT Place 1 tablet (0.4 mg total) under the tongue every 5 (five) minutes x 3 doses as needed for chest pain.   vitamin C 1000 MG tablet Take 1,000 mg by mouth daily.   Xarelto Starter Pack Generic drug: Rivaroxaban Stater Pack (15 mg and 20 mg) Follow package directions: Take one 15mg  tablet by mouth twice a day. On day 22, switch to one 20mg  tablet once a day. Take with food.        Follow-up Information     Lawerance Cruel, MD. Schedule an appointment as soon as possible for a visit in 1 week(s).   Specialty: Family Medicine Contact information: [address] [place] [address] [phone]         Sherren Mocha, MD .   Specialty: Cardiology Contact information: [phone] [address] [address] [phone]                No Known Allergies  Consultations: None  Procedures/Studies: CT ANGIO CHEST PE W OR WO CONTRAST  Result Date: 08/01/2021 CLINICAL DATA:  Shortness of breath and tachycardia beginning Thursday. Hip replacement 3 weeks ago. Possible pulmonary emboli. EXAM: CT ANGIOGRAPHY CHEST WITH CONTRAST TECHNIQUE: Multidetector CT imaging of the chest was performed using the standard protocol during bolus administration of intravenous contrast. Multiplanar CT image reconstructions and MIPs were obtained to evaluate the vascular anatomy. CONTRAST:  189mL OMNIPAQUE IOHEXOL 350 MG/ML SOLN COMPARISON:  None. FINDINGS: Cardiovascular: Heart is normal size. Calcified plaque over the lateral circumflex coronary artery. Thoracic aorta is normal caliber. Pulmonary arterial system is well opacified as there are multiple emboli over the left proximal upper and lower lobar arteries. Moderate emboli over  the proximal right upper, middle and lower lobar arteries. RV/LV= 49.3/46.8= 1.05. Remaining vascular structures are unremarkable. Mediastinum/Nodes: No evidence of mediastinal or hilar adenopathy. Small to moderate size hiatal hernia. Remaining mediastinal structures are unremarkable. Lungs/Pleura: Lungs are adequately inflated. Consolidation over the right lower lobe likely atelectasis or infarct. Airways are normal. Upper Abdomen: Mild calcified plaque over the abdominal aorta. No acute findings. Musculoskeletal: Mild spondylosis of the spine. No focal abnormality. Review of the MIP images confirms the above findings. IMPRESSION: 1. Moderate burden of acute bilateral pulmonary emboli. Consolidation over the right lower lobe likely atelectasis or infarct. Positive for acute PE with CTevidence of right heart strain (RV/LV Ratio = 1.05) consistent with at least submassive (intermediate risk) PE. The presence of right heart strain has been associated with an increased risk of morbidity and mortality. 2. Aortic atherosclerosis. Atherosclerotic coronary artery disease. 3. Small to moderate size hiatal hernia. Aortic Atherosclerosis (ICD10-I70.0). Critical Value/emergent results were called by telephone at the time of interpretation on 08/01/2021 at 4:21 pm to provider Dr. Karle Starch, who verbally acknowledged these results. Electronically Signed   By: Marin Olp  M.D.   On: 08/01/2021 16:21   DG Pelvis Portable  Result Date: 07/09/2021 CLINICAL DATA:  Left hip replacement surgery EXAM: PORTABLE PELVIS 1-2 VIEWS COMPARISON:  09/01/2011 FINDINGS: Interval left hip arthroplasty, components project in expected location. No fracture or dislocation. Stable right hip arthroplasty components. Left lateral skin staples. IMPRESSION: Left hip arthroplasty without apparent complication. Electronically Signed   By: Lucrezia Europe M.D.   On: 07/09/2021 10:43   DG Chest Port 1 View  Result Date: 08/01/2021 CLINICAL DATA:   Shortness of breath EXAM: PORTABLE CHEST 1 VIEW COMPARISON:  November 12, 2014 FINDINGS: The heart size and mediastinal contours are within normal limits. Both lungs are clear. The visualized skeletal structures are unremarkable. IMPRESSION: No active disease. Electronically Signed   By: Dorise Bullion III M.D.   On: 08/01/2021 16:18   DG C-Arm 1-60 Min-No Report  Result Date: 07/09/2021 CLINICAL DATA:  Total left hip arthroplasty. EXAM: OPERATIVE left HIP (WITH PELVIS IF PERFORMED) 6 VIEWS TECHNIQUE: Fluoroscopic spot image(s) were submitted for interpretation post-operatively. COMPARISON:  Radiographs 03/03/2021 FINDINGS: Fluoroscopic spot images demonstrate placement of a total left hip arthroplasty. The components appear well seated without complicating features. The visualized bony pelvis is intact. IMPRESSION: Total left hip arthroplasty in good position without complicating features. Electronically Signed   By: Marijo Sanes M.D.   On: 07/09/2021 10:11   ECHOCARDIOGRAM COMPLETE  Result Date: 08/03/2021    ECHOCARDIOGRAM REPORT   Patient Name:   Leonard Rodriguez Date of Exam: 08/03/2021 Medical Rec #:  [id number]      Height:       70.0 in Accession #:    [id number]     Weight:       187.1 lb Date of Birth:  1946-07-25      BSA:          2.029 m Patient Age:    75 years       BP:           131/62 mmHg Patient Gender: M              HR:           70 bpm. Exam Location:  Inpatient Procedure: 2D Echo, Cardiac Doppler, Color Doppler and Intracardiac            Opacification Agent Indications:    Pulmonary Embolus I26.09  History:        Patient has prior history of Echocardiogram examinations, most                 recent 06/24/2021. Idiopathic CMP, CAD; Risk                 Factors:Hypertension and Dyslipidemia.  Sonographer:    Bernadene Person RDCS Referring Phys: [id number] RONDELL A SMITH IMPRESSIONS  1. Left ventricular ejection fraction, by estimation, is 55 to 60%. The left ventricle has normal function. The  left ventricle has no regional wall motion abnormalities. Left ventricular diastolic parameters are consistent with Grade I diastolic dysfunction (impaired relaxation).  2. Right ventricular systolic function is normal. The right ventricular size is normal. Tricuspid regurgitation signal is inadequate for assessing PA pressure.  3. The mitral valve is normal in structure. Trivial mitral valve regurgitation. No evidence of mitral stenosis.  4. The aortic valve is tricuspid. Aortic valve regurgitation is not visualized. Aortic valve sclerosis/calcification is present, without any evidence of aortic stenosis.  5. Aortic dilatation noted. There is mild dilatation of the aortic root,  measuring 40 mm.  6. The inferior vena cava is normal in size with greater than 50% respiratory variability, suggesting right atrial pressure of 3 mmHg. FINDINGS  Left Ventricle: Left ventricular ejection fraction, by estimation, is 55 to 60%. The left ventricle has normal function. The left ventricle has no regional wall motion abnormalities. Definity contrast agent was given IV to delineate the left ventricular  endocardial borders. The left ventricular internal cavity size was normal in size. There is no left ventricular hypertrophy. Left ventricular diastolic parameters are consistent with Grade I diastolic dysfunction (impaired relaxation). Right Ventricle: The right ventricular size is normal. No increase in right ventricular wall thickness. Right ventricular systolic function is normal. Tricuspid regurgitation signal is inadequate for assessing PA pressure. Left Atrium: Left atrial size was normal in size. Right Atrium: Right atrial size was normal in size. Pericardium: There is no evidence of pericardial effusion. Mitral Valve: The mitral valve is normal in structure. Trivial mitral valve regurgitation. No evidence of mitral valve stenosis. Tricuspid Valve: The tricuspid valve is normal in structure. Tricuspid valve regurgitation is  trivial. Aortic Valve: The aortic valve is tricuspid. Aortic valve regurgitation is not visualized. Aortic valve sclerosis/calcification is present, without any evidence of aortic stenosis. Pulmonic Valve: The pulmonic valve was normal in structure. Pulmonic valve regurgitation is trivial. Aorta: Aortic dilatation noted. There is mild dilatation of the aortic root, measuring 40 mm. Venous: The inferior vena cava is normal in size with greater than 50% respiratory variability, suggesting right atrial pressure of 3 mmHg. IAS/Shunts: No atrial level shunt detected by color flow Doppler.  LEFT VENTRICLE PLAX 2D LVIDd:         4.60 cm   Diastology LVIDs:         2.90 cm   LV e' medial:    6.43 cm/s LV PW:         1.00 cm   LV E/e' medial:  8.2 LV IVS:        0.80 cm   LV e' lateral:   8.31 cm/s LVOT diam:     2.20 cm   LV E/e' lateral: 6.3 LV SV:         68 LV SV Index:   33 LVOT Area:     3.80 cm  RIGHT VENTRICLE RV S prime:     12.30 cm/s TAPSE (M-mode): 1.7 cm LEFT ATRIUM             Index        RIGHT ATRIUM           Index LA diam:        2.70 cm 1.33 cm/m   RA Area:     13.20 cm LA Vol (A2C):   37.9 ml 18.68 ml/m  RA Volume:   29.20 ml  14.39 ml/m LA Vol (A4C):   27.8 ml 13.70 ml/m LA Biplane Vol: 34.1 ml 16.81 ml/m  AORTIC VALVE LVOT Vmax:   106.00 cm/s LVOT Vmean:  68.600 cm/s LVOT VTI:    0.178 m  AORTA Ao Root diam: 4.00 cm Ao Asc diam:  3.80 cm MITRAL VALVE MV Area (PHT): 3.21 cm    SHUNTS MV Decel Time: 236 msec    Systemic VTI:  0.18 m MV E velocity: 52.70 cm/s  Systemic Diam: 2.20 cm MV A velocity: 86.10 cm/s MV E/A ratio:  0.61 Dalton McleanMD Electronically signed by Franki Monte Signature Date/Time: 08/03/2021/3:47:32 PM    Final    DG HIP OPERATIVE UNILAT  W OR W/O PELVIS LEFT  Result Date: 07/09/2021 CLINICAL DATA:  Total left hip arthroplasty. EXAM: OPERATIVE left HIP (WITH PELVIS IF PERFORMED) 6 VIEWS TECHNIQUE: Fluoroscopic spot image(s) were submitted for interpretation  post-operatively. COMPARISON:  Radiographs 03/03/2021 FINDINGS: Fluoroscopic spot images demonstrate placement of a total left hip arthroplasty. The components appear well seated without complicating features. The visualized bony pelvis is intact. IMPRESSION: Total left hip arthroplasty in good position without complicating features. Electronically Signed   By: Marijo Sanes M.D.   On: 07/09/2021 10:11   VAS [place] LOWER EXTREMITY VENOUS (DVT)  Result Date: 08/03/2021  Lower Venous DVT Study Patient Name:  Leonard Rodriguez  Date of Exam:   08/03/2021 Medical Rec #: [id number]       Accession #:    [id number] Date of Birth: Feb 14, 1946       Patient Gender: M Patient Age:   23 years Exam Location:  [facility] Procedure:      VAS [place] LOWER EXTREMITY VENOUS (DVT) Referring Phys: Fuller Plan --------------------------------------------------------------------------------  Indications: Pulmonary embolism.  Risk Factors: COVID 19 positive. Comparison Study: No prior studies. Performing Technologist: Oliver Hum RVT  Examination Guidelines: A complete evaluation includes B-mode imaging, spectral Doppler, color Doppler, and power Doppler as needed of all accessible portions of each vessel. Bilateral testing is considered an integral part of a complete examination. Limited examinations for reoccurring indications may be performed as noted. The reflux portion of the exam is performed with the patient in reverse Trendelenburg.  +---------+---------------+---------+-----------+----------+--------------+ RIGHT    CompressibilityPhasicitySpontaneityPropertiesThrombus Aging +---------+---------------+---------+-----------+----------+--------------+ CFV      Full           Yes      Yes                                 +---------+---------------+---------+-----------+----------+--------------+ SFJ      Full                                                         +---------+---------------+---------+-----------+----------+--------------+ FV Prox  Full                                                        +---------+---------------+---------+-----------+----------+--------------+ FV Mid   Full                                                        +---------+---------------+---------+-----------+----------+--------------+ FV DistalFull                                                        +---------+---------------+---------+-----------+----------+--------------+ PFV      Full                                                        +---------+---------------+---------+-----------+----------+--------------+  POP      Full           Yes      Yes                                 +---------+---------------+---------+-----------+----------+--------------+ PTV      Full                                                        +---------+---------------+---------+-----------+----------+--------------+ PERO     Full                                                        +---------+---------------+---------+-----------+----------+--------------+   +---------+---------------+---------+-----------+----------+--------------+ LEFT     CompressibilityPhasicitySpontaneityPropertiesThrombus Aging +---------+---------------+---------+-----------+----------+--------------+ CFV      Full           Yes      Yes                                 +---------+---------------+---------+-----------+----------+--------------+ SFJ      Full                                                        +---------+---------------+---------+-----------+----------+--------------+ FV Prox  Full                                                        +---------+---------------+---------+-----------+----------+--------------+ FV Mid   Full                                                         +---------+---------------+---------+-----------+----------+--------------+ FV DistalFull                                                        +---------+---------------+---------+-----------+----------+--------------+ PFV      Full                                                        +---------+---------------+---------+-----------+----------+--------------+ POP      Full           Yes      Yes                                 +---------+---------------+---------+-----------+----------+--------------+  PTV      None                                         Acute          +---------+---------------+---------+-----------+----------+--------------+ PERO     Full                                                        +---------+---------------+---------+-----------+----------+--------------+     Summary: RIGHT: - There is no evidence of deep vein thrombosis in the lower extremity.  - No cystic structure found in the popliteal fossa.  LEFT: - Findings consistent with acute deep vein thrombosis involving the left posterior tibial veins. - No cystic structure found in the popliteal fossa.  *See table(s) above for measurements and observations. Electronically signed by Deitra Mayo MD on 08/03/2021 at 12:15:32 PM.    Final      Subjective: Feels well, much improved. No fatigue, fever. His voice is back to normal. He has no chest pain. Getting up and down to bathroom without dyspnea.   Discharge Exam: Vitals:   08/03/21 1215 08/03/21 1605  BP: 114/64   Pulse: 72 78  Resp: 20   Temp: 98 F (36.7 C)   SpO2: 93% 95%   General: Pt is alert, awake, not in acute distress Cardiovascular: RRR, S1/S2 +, no rubs, no gallops Respiratory: CTA bilaterally, no wheezing, no rhonchi Abdominal: Soft, NT, ND, bowel sounds + Extremities: No edema, no cyanosis  Labs: BNP (last 3 results) No results for input(s): BNP in the last 8760 hours. Basic Metabolic Panel: Recent Labs   Lab 08/01/21 1505 08/03/21 0156  NA 136 134*  K 4.2 3.6  CL 100 98  CO2 26 29  GLUCOSE 126* 112*  BUN 22 13  CREATININE 0.99 0.88  CALCIUM 8.6* 8.3*  MG  --  2.1  PHOS  --  4.0   Liver Function Tests: Recent Labs  Lab 08/01/21 1505  AST 33  ALT 30  ALKPHOS 90  BILITOT 0.6  PROT 7.4  ALBUMIN 3.2*   No results for input(s): LIPASE, AMYLASE in the last 168 hours. No results for input(s): AMMONIA in the last 168 hours. CBC: Recent Labs  Lab 08/01/21 1505 08/02/21 1412 08/03/21 0156  WBC 11.3* 9.6 9.9  HGB 11.9* 11.7* 10.7*  HCT 35.6* 35.2* 32.0*  MCV 91.8 92.4 92.2  PLT 221 245 200   Cardiac Enzymes: No results for input(s): CKTOTAL, CKMB, CKMBINDEX, TROPONINI in the last 168 hours. BNP: Invalid input(s): POCBNP CBG: No results for input(s): GLUCAP in the last 168 hours. D-Dimer No results for input(s): DDIMER in the last 72 hours. Hgb A1c No results for input(s): HGBA1C in the last 72 hours. Lipid Profile Recent Labs    08/01/21 1813  TRIG 145   Thyroid function studies No results for input(s): TSH, T4TOTAL, T3FREE, THYROIDAB in the last 72 hours.  Invalid input(s): FREET3 Anemia work up Recent Labs    08/01/21 1720  FERRITIN 249   Urinalysis    Component Value Date/Time   COLORURINE YELLOW 08/17/2011 [place] 08/17/2011 1556   LABSPEC 1.031 (H) 08/17/2011 1556   PHURINE 5.5 08/17/2011 1556   GLUCOSEU  NEGATIVE 08/17/2011 1556   HGBUR NEGATIVE 08/17/2011 1556   [place] 08/17/2011 1556   KETONESUR NEGATIVE 08/17/2011 1556   PROTEINUR NEGATIVE 08/17/2011 1556   UROBILINOGEN 1.0 08/17/2011 1556   NITRITE NEGATIVE 08/17/2011 1556   LEUKOCYTESUR NEGATIVE 08/17/2011 1556    Microbiology Recent Results (from the past 240 hour(s))  Resp Panel by RT-PCR (Flu A&B, Covid) Nasopharyngeal Swab     Status: Abnormal   Collection Time: 08/01/21  2:54 PM   Specimen: Nasopharyngeal Swab; Nasopharyngeal(NP) swabs in vial  transport medium  Result Value Ref Range Status   SARS Coronavirus 2 by RT PCR POSITIVE (A) NEGATIVE Final    Comment: RESULT CALLED TO, READ BACK BY AND VERIFIED WITH:  SIMMS,M RN @1614  08/01/21 EDENSCA (NOTE) SARS-CoV-2 target nucleic acids are DETECTED.  The SARS-CoV-2 RNA is generally detectable in upper respiratory specimens during the acute phase of infection. Positive results are indicative of the presence of the identified virus, but do not rule out bacterial infection or co-infection with other pathogens not detected by the test. Clinical correlation with patient history and other diagnostic information is necessary to determine patient infection status. The expected result is Negative.  Fact Sheet for Patients: EntrepreneurPulse.com.au  Fact Sheet for Healthcare Providers: IncredibleEmployment.be  This test is not yet approved or cleared by the [place] FDA and  has been authorized for detection and/or diagnosis of SARS-CoV-2 by FDA under an Emergency Use Authorization (EUA).  This EUA will remain in effect (meaning this test can b e used) for the duration of  the COVID-19 declaration under Section 564(b)(1) of the Act, 21 U.S.C. section 360bbb-3(b)(1), unless the authorization is terminated or revoked sooner.     Influenza A by PCR NEGATIVE NEGATIVE Final   Influenza B by PCR NEGATIVE NEGATIVE Final    Comment: (NOTE) The Xpert Xpress SARS-CoV-2/FLU/RSV plus assay is intended as an aid in the diagnosis of influenza from Nasopharyngeal swab specimens and should not be used as a sole basis for treatment. Nasal washings and aspirates are unacceptable for Xpert Xpress SARS-CoV-2/FLU/RSV testing.  Fact Sheet for Patients: EntrepreneurPulse.com.au  Fact Sheet for Healthcare Providers: IncredibleEmployment.be  This test is not yet approved or cleared by the [place] FDA and has been  authorized for detection and/or diagnosis of SARS-CoV-2 by FDA under an Emergency Use Authorization (EUA). This EUA will remain in effect (meaning this test can be used) for the duration of the COVID-19 declaration under Section 564(b)(1) of the Act, 21 U.S.C. section 360bbb-3(b)(1), unless the authorization is terminated or revoked.  Performed at [facility], [address] [place], [place] [address]   Blood Culture (routine x 2)     Status: None (Preliminary result)   Collection Time: 08/01/21  5:18 PM   Specimen: BLOOD  Result Value Ref Range Status   Specimen Description BLOOD RIGHT ANTECUBITAL  Final   Special Requests   Final    BOTTLES DRAWN AEROBIC AND ANAEROBIC Blood Culture adequate volume   Culture   Final    NO GROWTH 2 DAYS Performed at [facility], [place] [address] [place], [place] [address]    Report Status PENDING  Incomplete  Blood Culture (routine x 2)     Status: None (Preliminary result)   Collection Time: 08/01/21  5:22 PM   Specimen: BLOOD  Result Value Ref Range Status   Specimen Description   Final    BLOOD RIGHT ANTECUBITAL Performed at [facility], [place]  [address] [place], [place] [address]    Special Requests   Final    BOTTLES DRAWN AEROBIC AND ANAEROBIC Blood Culture adequate volume Performed at [facility], [address] [place], [place] [address]    Culture   Final    NO GROWTH 2 DAYS Performed at [facility], [place] [address], [place], [place] [address]    Report Status PENDING  Incomplete    Time coordinating discharge: Approximately 40 minutes  Patrecia Pour, MD  [facility] 08/03/2021, 6:55 PM

## 2021-08-03 NOTE — Progress Notes (Signed)
Bilateral lower extremity venous duplex has been completed. Preliminary results can be found in CV Proc through chart review.  Results were given to Dr. Bonner Puna.  08/03/21 10:52 AM Leonard Rodriguez RVT

## 2021-08-06 LAB — CULTURE, BLOOD (ROUTINE X 2)
Culture: NO GROWTH
Culture: NO GROWTH
Special Requests: ADEQUATE
Special Requests: ADEQUATE

## 2021-08-13 ENCOUNTER — Telehealth (HOSPITAL_COMMUNITY): Payer: Self-pay | Admitting: Pharmacist

## 2021-08-13 ENCOUNTER — Other Ambulatory Visit (HOSPITAL_COMMUNITY): Payer: Self-pay

## 2021-08-16 DIAGNOSIS — Z125 Encounter for screening for malignant neoplasm of prostate: Secondary | ICD-10-CM | POA: Diagnosis not present

## 2021-08-16 DIAGNOSIS — I10 Essential (primary) hypertension: Secondary | ICD-10-CM | POA: Diagnosis not present

## 2021-08-16 DIAGNOSIS — I2699 Other pulmonary embolism without acute cor pulmonale: Secondary | ICD-10-CM | POA: Diagnosis not present

## 2021-08-16 DIAGNOSIS — E7800 Pure hypercholesterolemia, unspecified: Secondary | ICD-10-CM | POA: Diagnosis not present

## 2021-08-16 DIAGNOSIS — Z09 Encounter for follow-up examination after completed treatment for conditions other than malignant neoplasm: Secondary | ICD-10-CM | POA: Diagnosis not present

## 2021-08-16 DIAGNOSIS — Z23 Encounter for immunization: Secondary | ICD-10-CM | POA: Diagnosis not present

## 2021-08-16 DIAGNOSIS — Z79899 Other long term (current) drug therapy: Secondary | ICD-10-CM | POA: Diagnosis not present

## 2021-08-19 ENCOUNTER — Other Ambulatory Visit: Payer: Self-pay

## 2021-08-19 ENCOUNTER — Ambulatory Visit (INDEPENDENT_AMBULATORY_CARE_PROVIDER_SITE_OTHER): Payer: Medicare HMO | Admitting: Orthopaedic Surgery

## 2021-08-19 ENCOUNTER — Encounter: Payer: Self-pay | Admitting: Orthopaedic Surgery

## 2021-08-19 DIAGNOSIS — Z96642 Presence of left artificial hip joint: Secondary | ICD-10-CM

## 2021-08-19 NOTE — Progress Notes (Signed)
The patient is now 6 weeks status post a left total hip arthroplasty.  We had him on a baby aspirin twice a day after surgery.  He was doing incredibly well ambulating and back to the gym and walking daily.  He had no calf pain or leg or foot swelling.  3 weeks.  He was developing shortness of breath.  He went to the emergency room was found to have multiple PEs and DVT.  He is on Xarelto.  He says he is not short of breath anymore and is back in the gym and walking regularly.  He says his hip is doing well.  He will be on Xarelto for a minimum of 6 months he says.  His calf is soft today on the left side.  His left operative hip moves smoothly and fluidly.  His incision is healed nicely.  He will continue to crease his activities as comfort allows from my standpoint.  I would like to see him back in 3 months.  No x-rays are needed at that visit.  If there are issues before then he knows to let us know.

## 2021-08-24 ENCOUNTER — Other Ambulatory Visit (HOSPITAL_COMMUNITY): Payer: Self-pay

## 2021-08-24 NOTE — Telephone Encounter (Signed)
Pharmacy Transitions of Care Follow-up Telephone Call   Date of discharge: 08/03/21  Discharge Diagnosis: PE/DVT   How have you been since you were released from the hospital? Doing great    Medication changes made at discharge: START taking: Xarelto Starter Pack (Rivaroxaban Stater Pack (15 mg and 20 mg))  STOP taking: aspirin 81 MG chewable tablet  methocarbamol 500 MG tablet (ROBAXIN)  oxyCODONE 5 MG immediate release tablet (Oxy IR/ROXICODONE)    Medication changes verified by the patient?  Y     Medication Accessibility:   [facility]: [facility]    Was the patient provided with refills on discharged medications? No    Have all prescriptions been transferred from [facility] to home pharmacy? N/A    Is the patient able to afford medications? Y Notable copays: $47/month     Medication Review:   RIVAROXABAN (XARELTO)  Rivaroxaban 15 mg BID initiated on 08/02/21. Will switch to 20 mg daily after 21 days on 08/25/21 per pt. -Plan is for 3-6 months of Xarelto -Declined patient education -Follow-up Appointments:   Hospital f/u appt confirmed?  Seen Jean Rosenthal on 08/19/21 @ 2pm and Dr. Harrington Challenger on 08/16/21. Will see Dr. Harrington Challenger on 09/01/21 again and Dr. Harrington Challenger will provide script for maintenance Xarelto per pt.   [facility] f/u appt confirmed?  Scheduled to see Sherren Mocha on 09/10/21 @ 0820.    If their condition worsens, is the pt aware to call PCP or go to the Emergency Dept.? Y   Final Patient Assessment:   -Pt is doing well.  -Declined medication review of Xarelto (already educated while in hospital)  -Pt has post discharge appointments and Dr. Harrington Challenger to provide script for Xarelto.  Garnet Sierras, PharmD        Documentation  Total Entries: 1 View All Documentation 08/13/2021 1416  Garnet Sierras, [place]

## 2021-08-27 DIAGNOSIS — Z125 Encounter for screening for malignant neoplasm of prostate: Secondary | ICD-10-CM | POA: Diagnosis not present

## 2021-08-27 DIAGNOSIS — Z79899 Other long term (current) drug therapy: Secondary | ICD-10-CM | POA: Diagnosis not present

## 2021-08-27 DIAGNOSIS — E7800 Pure hypercholesterolemia, unspecified: Secondary | ICD-10-CM | POA: Diagnosis not present

## 2021-08-27 DIAGNOSIS — I10 Essential (primary) hypertension: Secondary | ICD-10-CM | POA: Diagnosis not present

## 2021-09-01 DIAGNOSIS — Z86711 Personal history of pulmonary embolism: Secondary | ICD-10-CM | POA: Diagnosis not present

## 2021-09-01 DIAGNOSIS — R799 Abnormal finding of blood chemistry, unspecified: Secondary | ICD-10-CM | POA: Diagnosis not present

## 2021-09-01 DIAGNOSIS — Z0000 Encounter for general adult medical examination without abnormal findings: Secondary | ICD-10-CM | POA: Diagnosis not present

## 2021-09-01 DIAGNOSIS — E7800 Pure hypercholesterolemia, unspecified: Secondary | ICD-10-CM | POA: Diagnosis not present

## 2021-09-01 DIAGNOSIS — I10 Essential (primary) hypertension: Secondary | ICD-10-CM | POA: Diagnosis not present

## 2021-09-01 DIAGNOSIS — Z125 Encounter for screening for malignant neoplasm of prostate: Secondary | ICD-10-CM | POA: Diagnosis not present

## 2021-09-09 NOTE — Progress Notes (Signed)
Cardiology Office Note:    Date:  09/10/2021   ID:  Leonard Rodriguez, DOB 14-Jan-1946, MRN [id number]  PCP:  Leonard Cruel, MD   [facility] Providers Cardiologist:  Sherren Mocha, MD     Referring MD: Leonard Cruel, MD   Chief Complaint  Patient presents with   Coronary Artery Disease    History of Present Illness:    Leonard Rodriguez is a 75 y.o. male with a hx of coronary artery disease, presenting for follow-up evaluation. The patient initially presented in 2016 with a non-STEMI and was found to have total occlusion of the left circumflex treated with a drug-eluting stent.  He otherwise had nonobstructive disease and was noted to have mildly reduced LV systolic function with an LVEF of 50 to 55%. He was hospitalized with a PE in November 2022 and has done well with anticoagulation on Xarelto. He was found to have a DVT, all of this occurring after hip replacement surgery.   The patient states that the few weeks after his hip surgery, he went back to the gym and began walking on the treadmill.  He did well for a few days but then developed significant exertional dyspnea.  He ultimately was taken to the emergency room and was found to have mild tachycardia and hypoxemia.  He was diagnosed with a DVT and pulmonary embolus.  He has done very well on rivaroxaban and reports complete resolution of his symptoms.  The patient denies recurrent shortness of breath and he has back to walking for exercise.  No chest pain, heart palpitations, edema, orthopnea, or PND.  Past Medical History:  Diagnosis Date   Coronary artery disease    a. 11/2014 NSTEMI/PCI: LM nl, LAD min irregs, D1 30-40, LCX 30-51m, 100d (3.5x18 Resolute DES), OM1/2 nl, RCA 50p, R->L collats, EF 55%, mild MR.   Hyperlipidemia    Hypertension    put on bp meds. when he was heavier and was not exercising   Ischemic cardiomyopathy    Echo 3/16:  EF 45-50%, inf-lat and inf HK, Gr 2 DD, mod MR, mild LAE   Mitral  regurgitation    Osteoarthritis of right hip    Recurrent upper respiratory infection (URI)    current cold being treated with otc meds.    Past Surgical History:  Procedure Laterality Date   CORONARY ANGIOPLASTY WITH STENT PLACEMENT  11/12/2014   "1"   JOINT REPLACEMENT     LEFT HEART CATHETERIZATION WITH CORONARY ANGIOGRAM N/A 11/12/2014   Procedure: LEFT HEART CATHETERIZATION WITH CORONARY ANGIOGRAM;  Surgeon: Blane Ohara, MD;  Location: [facility] CATH LAB;  Service: Cardiovascular;  Laterality: N/A;   TONSILLECTOMY  1950's   TOTAL HIP ARTHROPLASTY  08/30/2011   Procedure: TOTAL HIP ARTHROPLASTY;  Surgeon: Garald Balding, MD;  Location: [facility];  Service: Orthopedics;  Laterality: Right;   TOTAL HIP ARTHROPLASTY Left 07/09/2021   Procedure: LEFT TOTAL HIP ARTHROPLASTY ANTERIOR APPROACH;  Surgeon: Mcarthur Rossetti, MD;  Location: [facility] ORS;  Service: Orthopedics;  Laterality: Left;   VASECTOMY      Current Medications: Current Meds  Medication Sig   Ascorbic Acid (VITAMIN C) 1000 MG tablet Take 1,000 mg by mouth daily.   atorvastatin (LIPITOR) 80 MG tablet TAKE ONE TABLET BY MOUTH DAILY AT 6:00 IN THE EVENING (Patient taking differently: Take 80 mg by mouth at bedtime.)   benazepril-hydrochlorthiazide (LOTENSIN HCT) 20-25 MG per tablet Take 1 tablet by mouth daily.   metoprolol tartrate (  LOPRESSOR) 25 MG tablet TAKE ONE TABLET BY MOUTH TWICE A DAY (Patient taking differently: Take 25 mg by mouth in the morning and at bedtime.)   Multiple Vitamins-Minerals (MULTIVITAMIN WITH MINERALS) tablet Take 1 tablet by mouth daily.    nitroGLYCERIN (NITROSTAT) 0.4 MG SL tablet Place 1 tablet (0.4 mg total) under the tongue every 5 (five) minutes x 3 doses as needed for chest pain.   Omega-3 Fatty Acids (FISH OIL) 1200 MG CAPS Take 1,200 mg by mouth daily.     Allergies:   Patient has no known allergies.   Social History   Socioeconomic History   Marital status: Married    Spouse name: Not  on file   Number of children: Not on file   Years of education: Not on file   Highest education level: Not on file  Occupational History   Not on file  Tobacco Use   Smoking status: Never   Smokeless tobacco: Never  Vaping Use   Vaping Use: Never used  Substance and Sexual Activity   Alcohol use: Yes    Comment: 11/12/2014 "beer maybe twice/month"   Drug use: No   Sexual activity: Not Currently  Other Topics Concern   Not on file  Social History Narrative   Not on file   Social Determinants of Health   Financial Resource Strain: Not on file  Food Insecurity: Not on file  Transportation Needs: Not on file  Physical Activity: Not on file  Stress: Not on file  Social Connections: Not on file     Family History: The patient's family history includes Lung cancer in his father. There is no history of Heart attack.  ROS:   Please see the history of present illness.    All other systems reviewed and are negative.  EKGs/Labs/Other Studies Reviewed:    The following studies were reviewed today: Echo 08/03/2021: 1. Left ventricular ejection fraction, by estimation, is 55 to 60%. The  left ventricle has normal function. The left ventricle has no regional  wall motion abnormalities. Left ventricular diastolic parameters are  consistent with Grade I diastolic  dysfunction (impaired relaxation).   2. Right ventricular systolic function is normal. The right ventricular  size is normal. Tricuspid regurgitation signal is inadequate for assessing  PA pressure.   3. The mitral valve is normal in structure. Trivial mitral valve  regurgitation. No evidence of mitral stenosis.   4. The aortic valve is tricuspid. Aortic valve regurgitation is not  visualized. Aortic valve sclerosis/calcification is present, without any  evidence of aortic stenosis.   5. Aortic dilatation noted. There is mild dilatation of the aortic root,  measuring 40 mm.   6. The inferior vena cava is normal in size  with greater than 50%  respiratory variability, suggesting right atrial pressure of 3 mmHg.  EKG:  EKG is not ordered today.    Recent Labs: 06/08/2021: TSH 1.900 08/01/2021: ALT 30 08/03/2021: BUN 13; Creatinine, Ser 0.88; Hemoglobin 10.7; Magnesium 2.1; Platelets 200; Potassium 3.6; Sodium 134  Recent Lipid Panel    Component Value Date/Time   CHOL 136 06/08/2021 1151   TRIG 145 08/01/2021 1813   HDL 32 (L) 06/08/2021 1151   CHOLHDL 4.3 06/08/2021 1151   CHOLHDL 3 01/14/2015 0922   VLDL 25.0 01/14/2015 0922   LDLCALC 70 06/08/2021 1151     Risk Assessment/Calculations:           Physical Exam:    VS:  BP 102/64    Pulse [name])  55    Ht 5\' 10"  (1.778 m)    Wt 191 lb (86.6 kg)    SpO2 98%    BMI 27.41 kg/m     Wt Readings from Last 3 Encounters:  09/10/21 191 lb (86.6 kg)  08/02/21 187 lb 1.6 oz (84.9 kg)  07/09/21 197 lb 5 oz (89.5 kg)     GEN:  Well nourished, well developed in no acute distress HEENT: Normal NECK: No JVD; No carotid bruits LYMPHATICS: No lymphadenopathy CARDIAC: RRR, no murmurs, rubs, gallops RESPIRATORY:  Clear to auscultation without rales, wheezing or rhonchi  ABDOMEN: Soft, non-tender, non-distended MUSCULOSKELETAL:  No edema; No deformity  SKIN: Warm and dry NEUROLOGIC:  Alert and oriented x 3 PSYCHIATRIC:  Normal affect   ASSESSMENT:    1. Coronary artery disease involving native coronary artery of native heart without angina pectoris   2. Mixed hyperlipidemia   3. Essential hypertension   4. Pulmonary embolism without acute cor pulmonale, unspecified chronicity, unspecified pulmonary embolism type (HCC)    PLAN:    In order of problems listed above:  Stable with no symptoms of angina.  Currently off of aspirin because of anticoagulation.  Remains on a beta-blocker and high intensity statin drug.  Once he has completed his course of rivaroxaban which I suspect will be 6 months, he should be started back on aspirin 81 mg  daily. Treated with atorvastatin 80 mg daily.  LDL cholesterol is 68. Blood pressure well controlled continue metoprolol, benazepril, and hydrochlorothiazide. Postoperative, associated with hip replacement.  He will complete 6 months of oral anticoagulation.  I reviewed the echocardiogram that was performed while he was in the hospital and it showed no evidence of RV dysfunction or dilatation.     Medication Adjustments/Labs and Tests Ordered: Current medicines are reviewed at length with the patient today.  Concerns regarding medicines are outlined above.  No orders of the defined types were placed in this encounter.  No orders of the defined types were placed in this encounter.   Patient Instructions  Medication Instructions:  Your physician recommends that you continue on your current medications as directed. Please refer to the Current Medication list given to you today.  *If you need a refill on your cardiac medications before your next appointment, please call your pharmacy*   Lab Work: None today  If you have labs (blood work) drawn today and your tests are completely normal, you will receive your results only by: [place] (if you have MyChart) OR A paper copy in the mail If you have any lab test that is abnormal or we need to change your treatment, we will call you to review the results.    Follow-Up: At [facility], you and your health needs are our priority.  As part of our continuing mission to provide you with exceptional heart care, we have created designated Provider Care Teams.  These Care Teams include your primary Cardiologist (physician) and Advanced Practice Providers (APPs -  Physician Assistants and Nurse Practitioners) who all work together to provide you with the care you need, when you need it.  We recommend signing up for the patient portal called "MyChart".  Sign up information is provided on this After Visit Summary.  MyChart is used to connect  with patients for Virtual Visits (Telemedicine).  Patients are able to view lab/test results, encounter notes, upcoming appointments, etc.  Non-urgent messages can be sent to your provider as well.   To learn more about what  you can do with MyChart, go to [email or web address].    Your next appointment:   1 year(s)  The format for your next appointment:   In Person  Provider:   Sherren Mocha, MD {      Signed, Sherren Mocha, MD  09/10/2021 9:42 AM    [place]

## 2021-09-10 ENCOUNTER — Other Ambulatory Visit: Payer: Self-pay

## 2021-09-10 ENCOUNTER — Ambulatory Visit: Payer: Medicare HMO | Admitting: Cardiovascular Disease

## 2021-09-10 ENCOUNTER — Encounter: Payer: Self-pay | Admitting: Cardiovascular Disease

## 2021-09-10 VITALS — BP 102/64 | HR 55 | Ht 70.0 in | Wt 191.0 lb

## 2021-09-10 DIAGNOSIS — I2699 Other pulmonary embolism without acute cor pulmonale: Secondary | ICD-10-CM

## 2021-09-10 DIAGNOSIS — I10 Essential (primary) hypertension: Secondary | ICD-10-CM | POA: Diagnosis not present

## 2021-09-10 DIAGNOSIS — I2510 Atherosclerotic heart disease of native coronary artery without angina pectoris: Secondary | ICD-10-CM | POA: Diagnosis not present

## 2021-09-10 DIAGNOSIS — E782 Mixed hyperlipidemia: Secondary | ICD-10-CM

## 2021-09-10 NOTE — Patient Instructions (Signed)
Medication Instructions:  Your physician recommends that you continue on your current medications as directed. Please refer to the Current Medication list given to you today.  *If you need a refill on your cardiac medications before your next appointment, please call your pharmacy*   Lab Work: None today  If you have labs (blood work) drawn today and your tests are completely normal, you will receive your results only by: [place] (if you have MyChart) OR A paper copy in the mail If you have any lab test that is abnormal or we need to change your treatment, we will call you to review the results.    Follow-Up: At [facility], you and your health needs are our priority.  As part of our continuing mission to provide you with exceptional heart care, we have created designated Provider Care Teams.  These Care Teams include your primary Cardiologist (physician) and Advanced Practice Providers (APPs -  Physician Assistants and Nurse Practitioners) who all work together to provide you with the care you need, when you need it.  We recommend signing up for the patient portal called "MyChart".  Sign up information is provided on this After Visit Summary.  MyChart is used to connect with patients for Virtual Visits (Telemedicine).  Patients are able to view lab/test results, encounter notes, upcoming appointments, etc.  Non-urgent messages can be sent to your provider as well.   To learn more about what you can do with MyChart, go to [email or web address].    Your next appointment:   1 year(s)  The format for your next appointment:   In Person  Provider:   Sherren Mocha, MD {

## 2021-10-11 ENCOUNTER — Telehealth: Payer: Self-pay | Admitting: Orthopaedic Surgery

## 2021-10-11 NOTE — Telephone Encounter (Signed)
Pt called stating he has an upcoming dental appt  2/13. Pt states he needs pre meds for appt. Please send to [place] on [facility]. Please call pt when meds have been sent in. Pt phone number is [phone].

## 2021-10-11 NOTE — Telephone Encounter (Signed)
Pt is 3 months out. No longer needs this.

## 2021-10-11 NOTE — Telephone Encounter (Signed)
Lvm advising pt.

## 2021-10-29 ENCOUNTER — Other Ambulatory Visit: Payer: Self-pay | Admitting: Cardiovascular Disease

## 2021-10-30 DIAGNOSIS — H524 Presbyopia: Secondary | ICD-10-CM | POA: Diagnosis not present

## 2021-10-30 DIAGNOSIS — H52209 Unspecified astigmatism, unspecified eye: Secondary | ICD-10-CM | POA: Diagnosis not present

## 2021-10-30 DIAGNOSIS — H5213 Myopia, bilateral: Secondary | ICD-10-CM | POA: Diagnosis not present

## 2021-11-11 ENCOUNTER — Other Ambulatory Visit: Payer: Self-pay | Admitting: Cardiovascular Disease

## 2021-11-13 IMAGING — XA DG HIP (WITH PELVIS) OPERATIVE*L*
1 series · 6 of 6 positions shown · non-contrast
Comparison: Radiographs 03/03/2021

CLINICAL DATA: Total left hip arthroplasty.

EXAM:
OPERATIVE left HIP (WITH PELVIS IF PERFORMED) 6 VIEWS
TECHNIQUE: Fluoroscopic spot image(s) were submitted for interpretation
post-operatively.

[Series 1: unknown protocol · 6 of 6 slices shown]
[im 1/6]
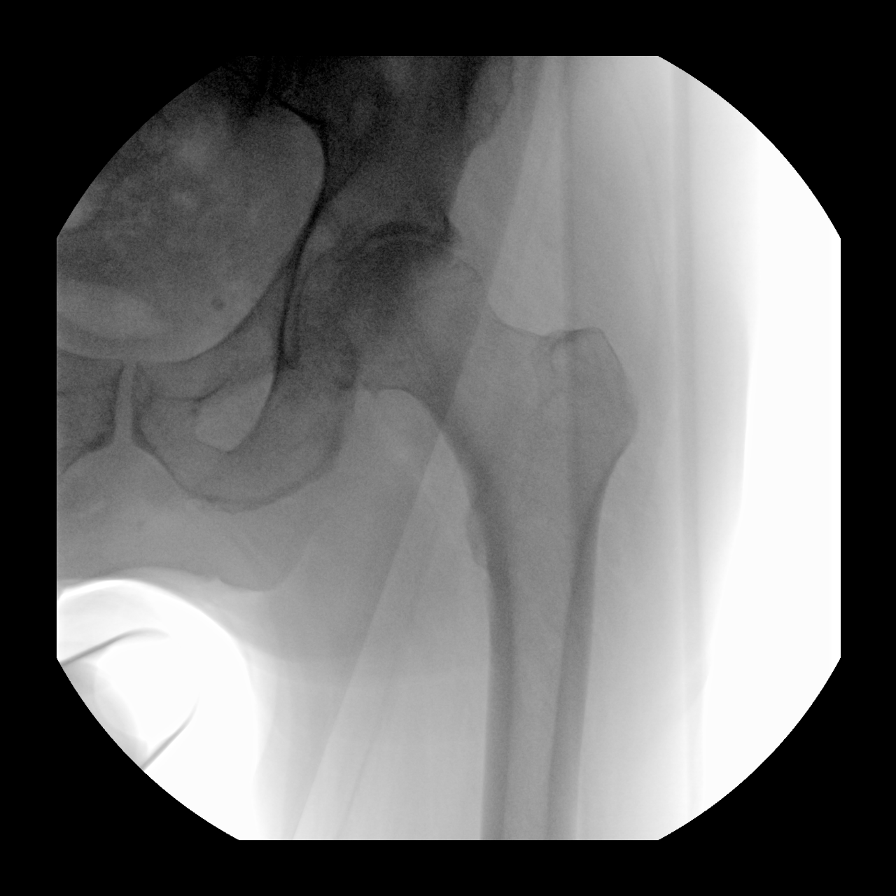
[im 2/6]
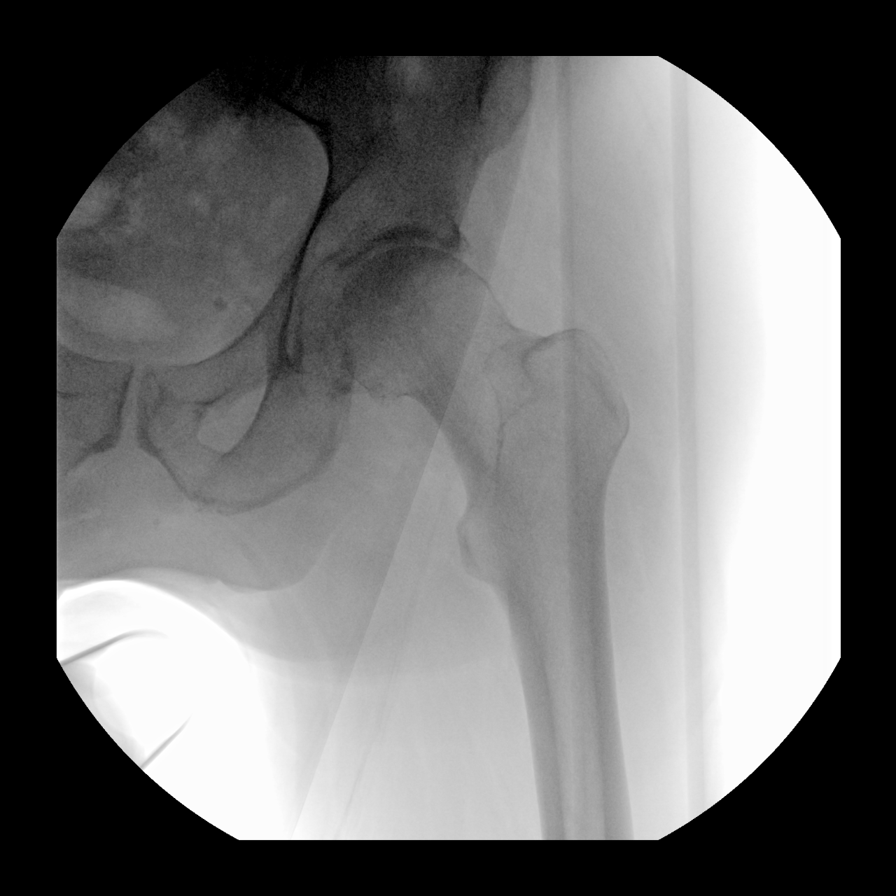
[im 3/6]
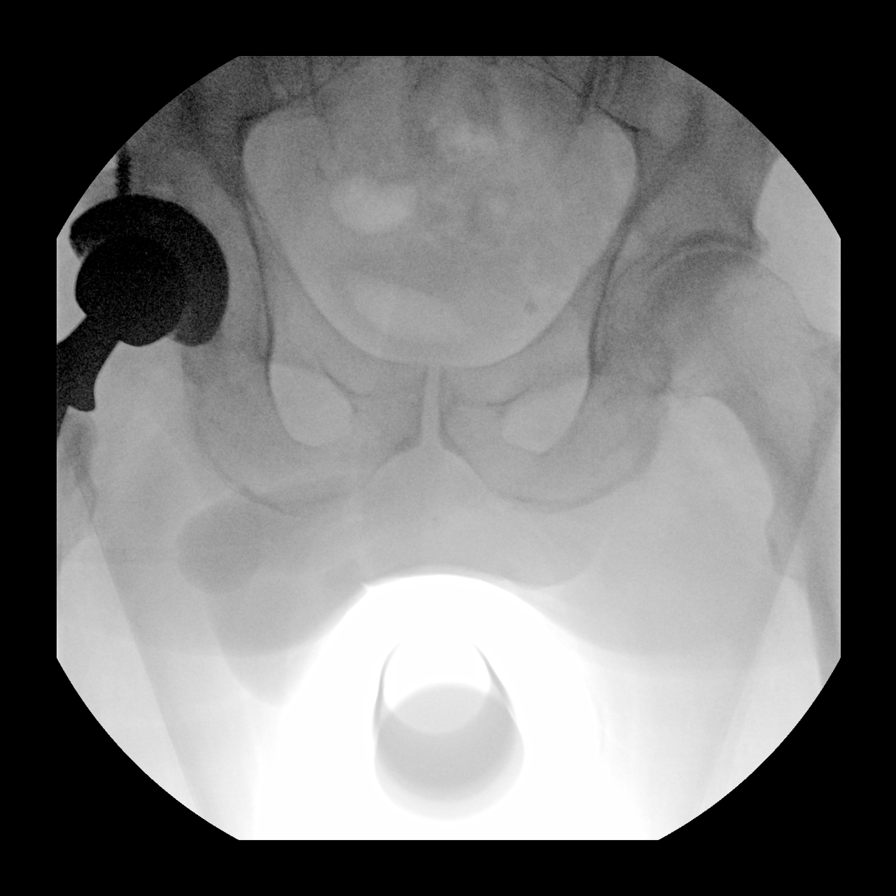
[im 4/6]
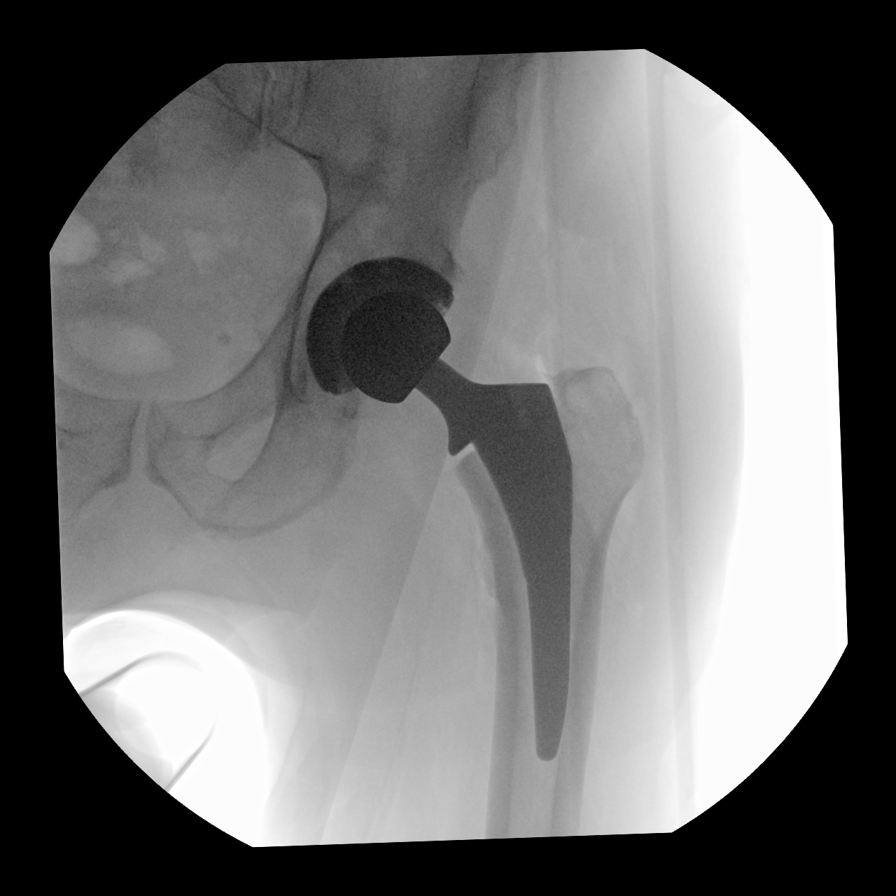
[im 5/6]
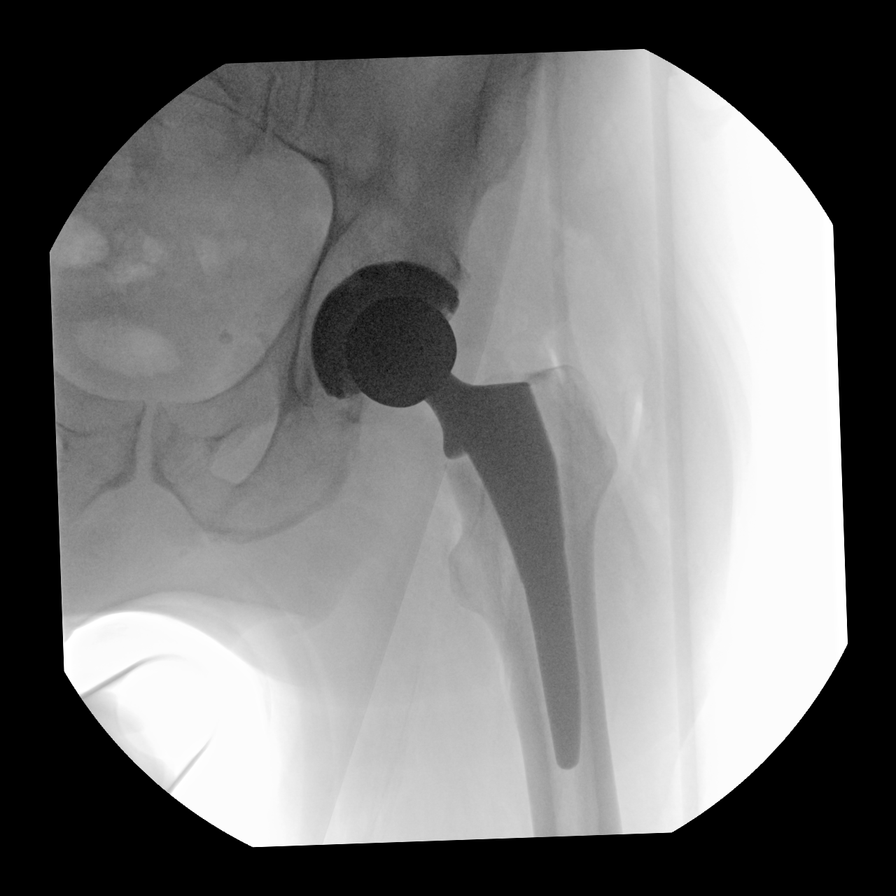
[im 6/6]
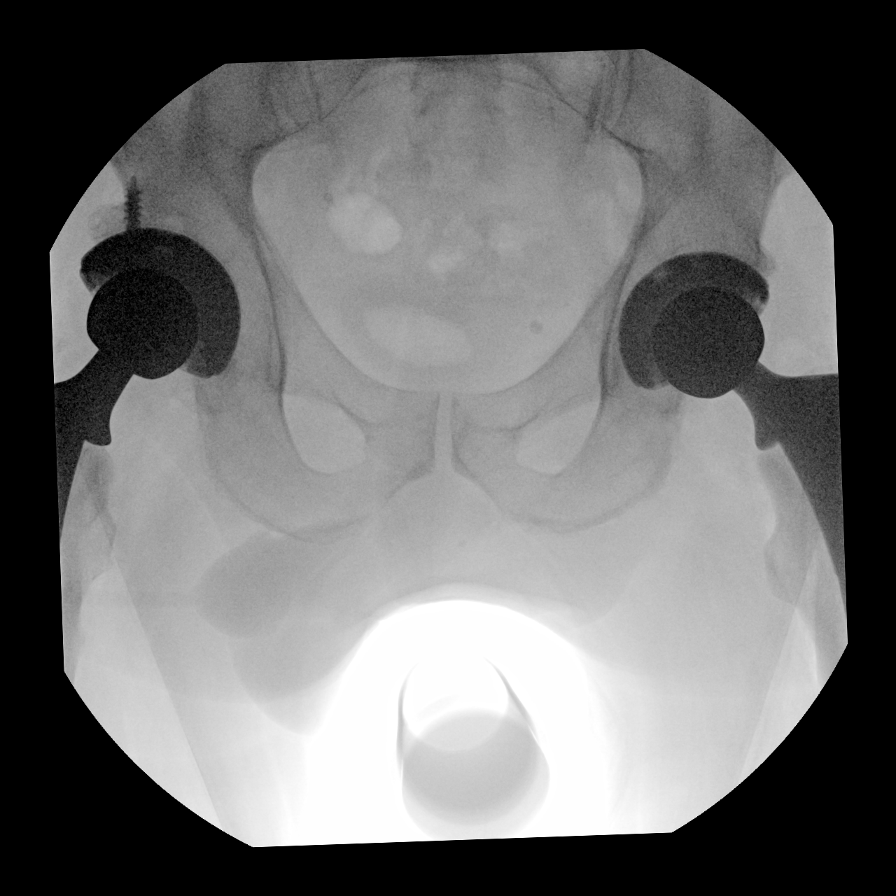

[6 of 6 positions shown; findings below may reference images not displayed]

FINDINGS: Fluoroscopic spot images demonstrate placement of a total left hip
arthroplasty. The components appear well seated without complicating
features. The visualized bony pelvis is intact.
IMPRESSION: Total left hip arthroplasty in good position without complicating
features.

## 2021-11-13 IMAGING — DX DG PORTABLE PELVIS
1 series · 1 of 1 positions shown · non-contrast
Comparison: 09/01/2011

CLINICAL DATA: Left hip replacement surgery

EXAM:
PORTABLE PELVIS 1-2 VIEWS

[pelvis ap]
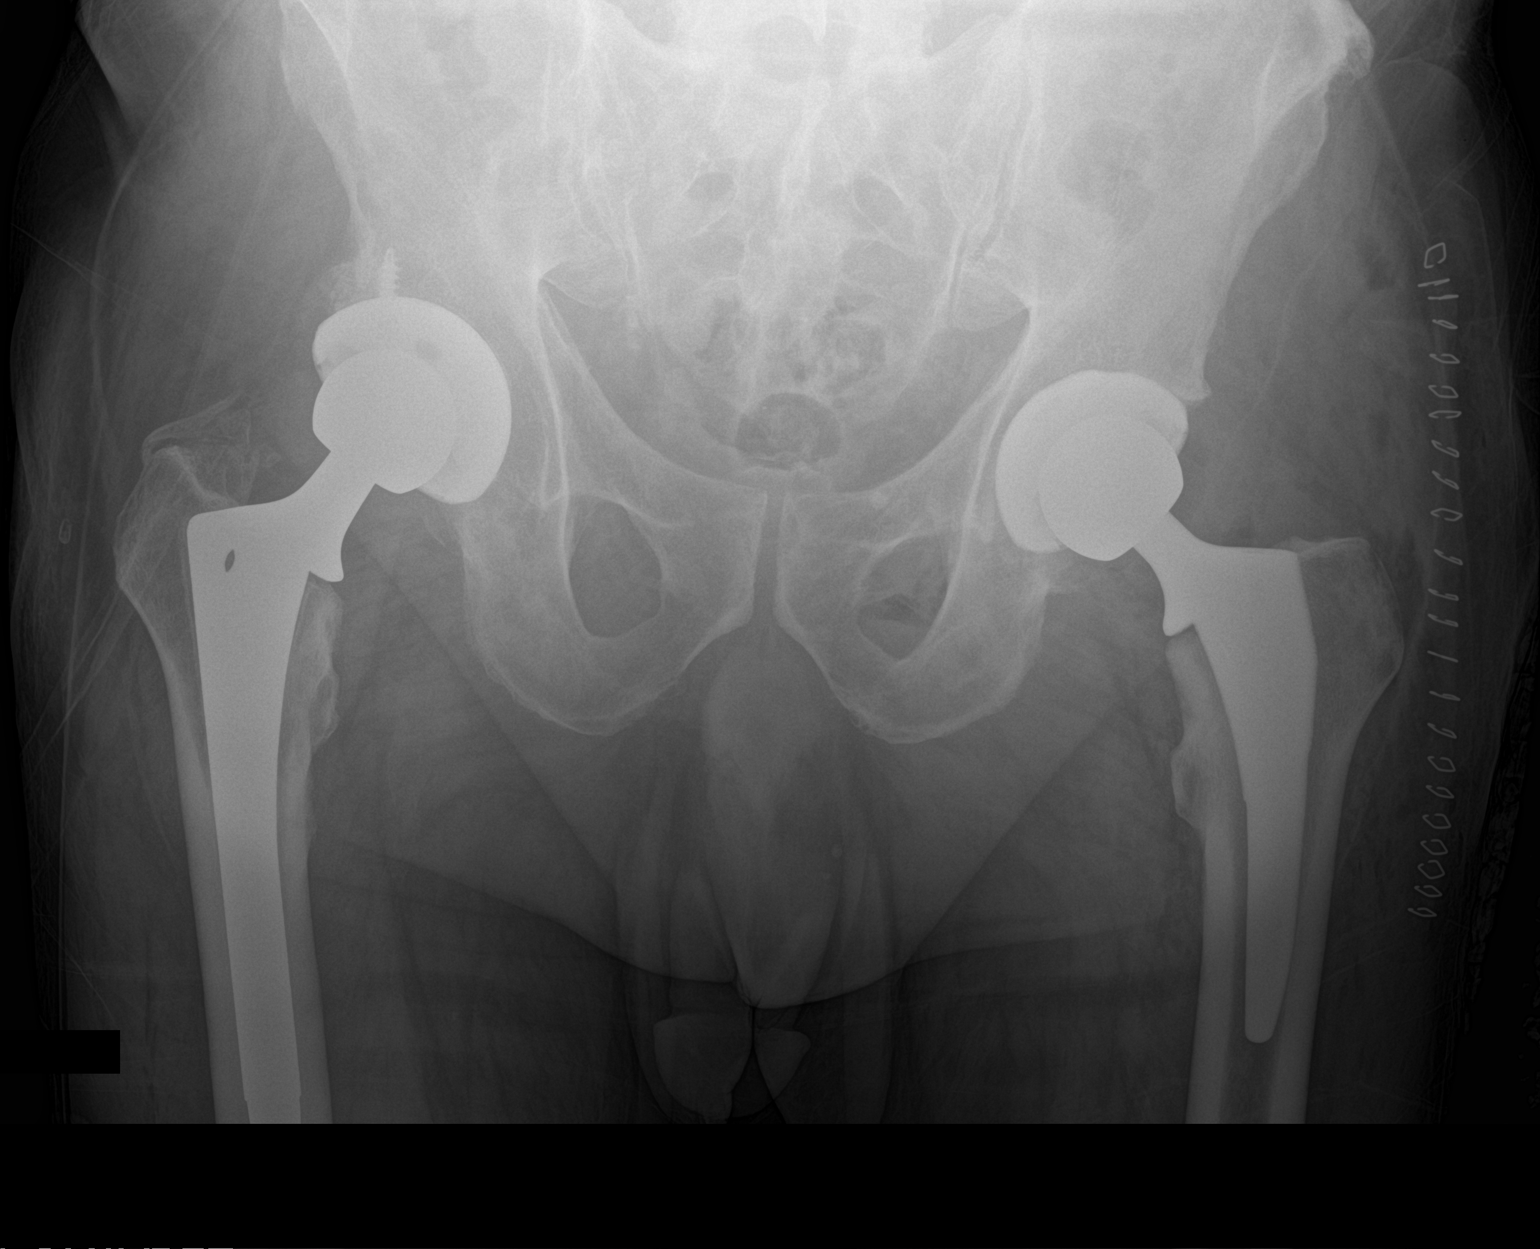

[1 of 1 positions shown; findings below may reference images not displayed]

FINDINGS: Interval left hip arthroplasty, components project in expected
location. No fracture or dislocation. Stable right hip arthroplasty
components. Left lateral skin staples.
IMPRESSION: Left hip arthroplasty without apparent complication.

## 2021-11-18 ENCOUNTER — Other Ambulatory Visit: Payer: Self-pay

## 2021-11-18 ENCOUNTER — Ambulatory Visit (INDEPENDENT_AMBULATORY_CARE_PROVIDER_SITE_OTHER): Payer: Medicare HMO | Admitting: Orthopaedic Surgery

## 2021-11-18 ENCOUNTER — Encounter: Payer: Self-pay | Admitting: Orthopaedic Surgery

## 2021-11-18 DIAGNOSIS — Z96642 Presence of left artificial hip joint: Secondary | ICD-10-CM

## 2021-11-18 NOTE — Progress Notes (Signed)
The patient is 3 months status post a left total hip arthroplasty through anterior approach.  His right hip replaced back in 2012.  He states he is doing well overall and has no complaints at all.  His postoperative course was complicated by DVT and a PE.  He will be on Xarelto apparently for about 6 months. ? ?Both hips move smoothly and fluidly.  He has no limp at all.  His leg lengths are equal. ? ?This point we will see him back in 6 months to see how he is doing overall.  At that visit we will have a standing low AP pelvis and lateral of his more recent left operative hip.  If there are issues before then he knows to let us know. ?

## 2021-12-06 IMAGING — CT CT ANGIO CHEST
2 of 8 series · 18 of 36 positions shown · IV contrast (Omnipaque)
Comparison: None.

CLINICAL DATA: Shortness of breath and tachycardia beginning
[REDACTED]. Hip replacement 3 weeks ago. Possible pulmonary emboli.

EXAM:
CT ANGIOGRAPHY CHEST WITH CONTRAST
TECHNIQUE: Multidetector CT imaging of the chest was performed using the
standard protocol during bolus administration of intravenous
contrast. Multiplanar CT image reconstructions and MIPs were
obtained to evaluate the vascular anatomy.
CONTRAST:  100mL OMNIPAQUE IOHEXOL 350 MG/ML SOLN

[Series 6: pe coronal mpr · coronal · 0.63mm/px · 1 of 139 slices shown]
[im 70/139  mediastinal]
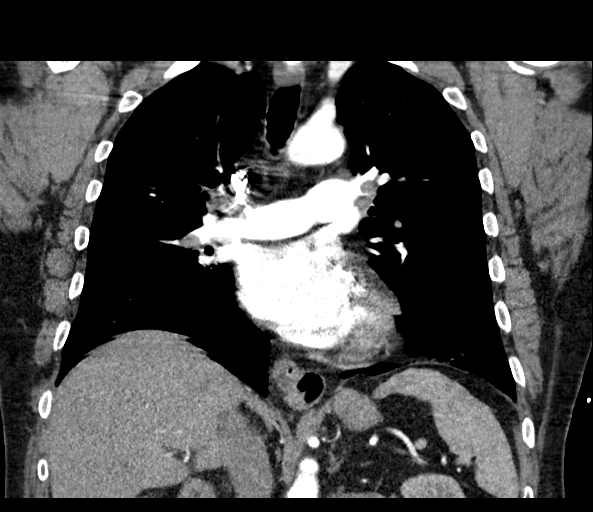

[Series 10: pe thins · axial · 0.77mm/px · z∈[+1093,+1370]mm · 17 of 311 slices shown]
[im 17/311  lung]
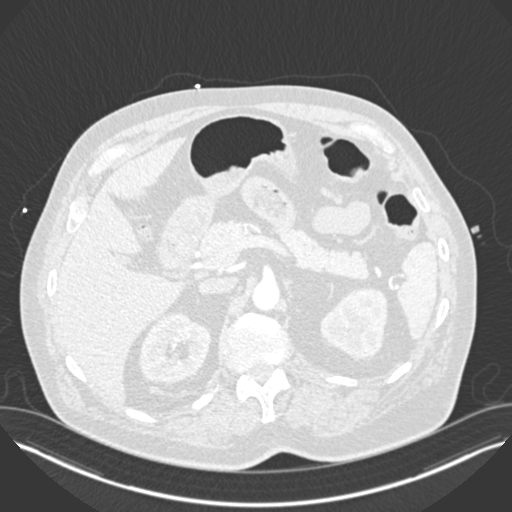
[im 33/311  mediastinal]
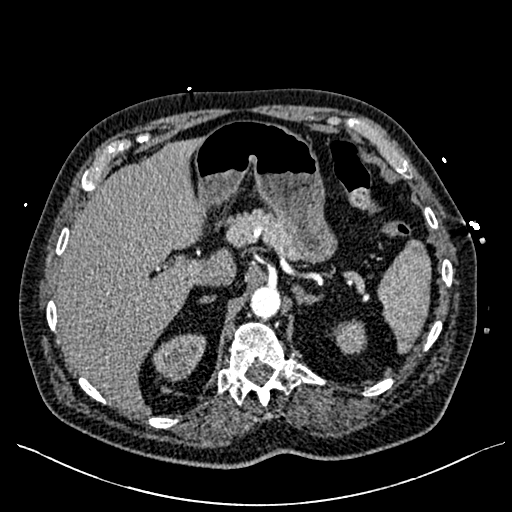
[im 49/311  lung]
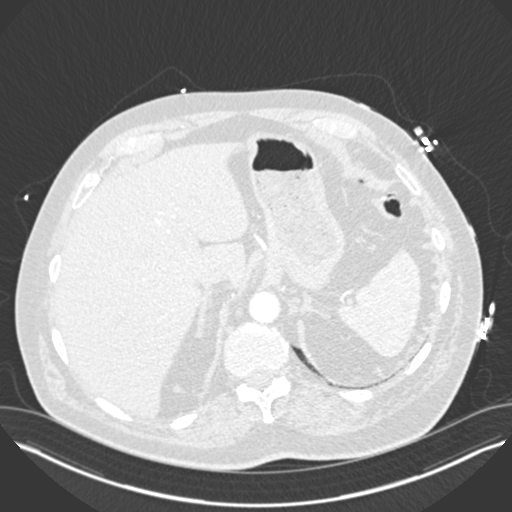
[im 66/311  mediastinal]
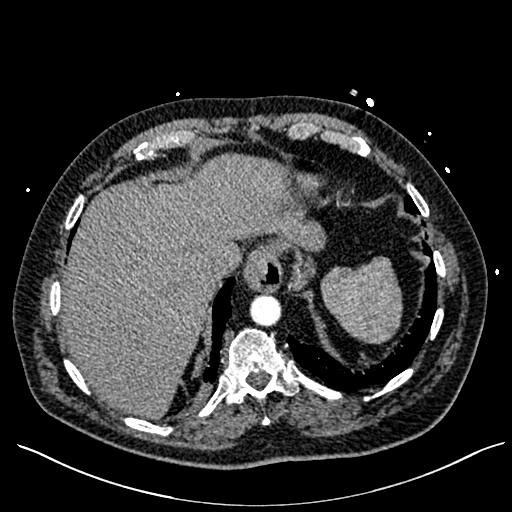
[im 82/311  lung]
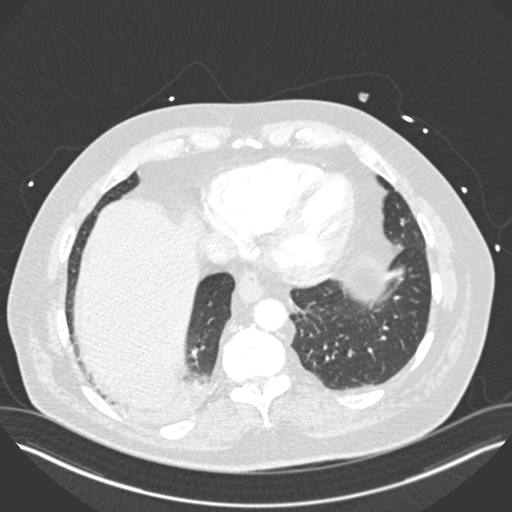
[im 98/311  mediastinal]
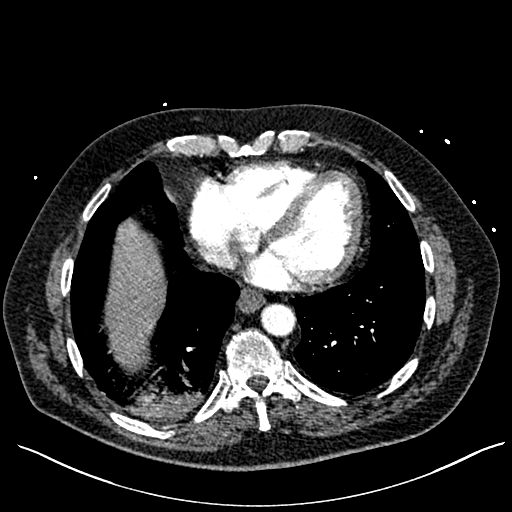
[im 115/311  lung]
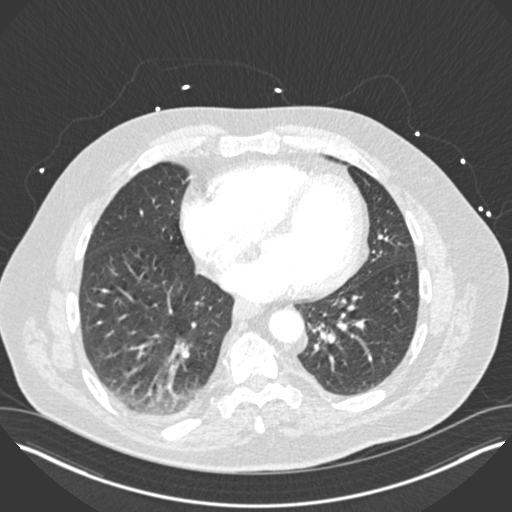
[im 131/311  mediastinal]
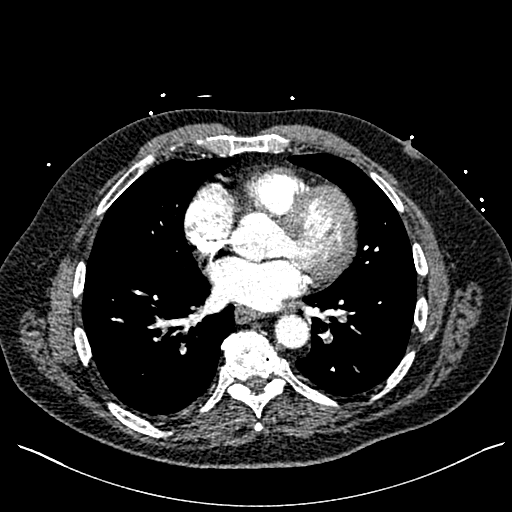
[im 164/311  lung]
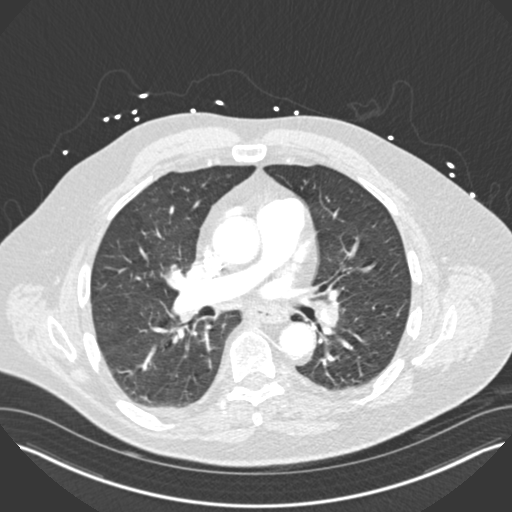
[im 180/311  mediastinal]
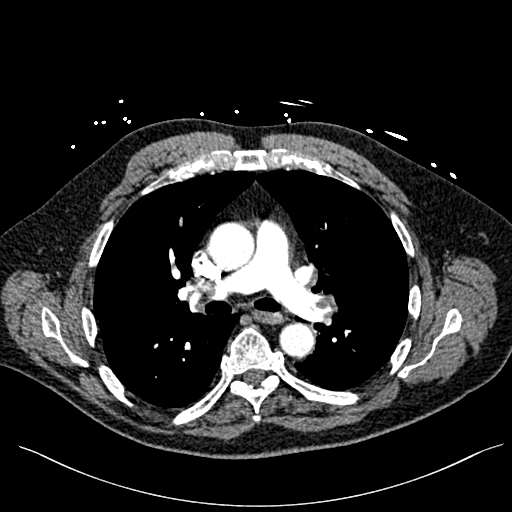
[im 196/311  lung]
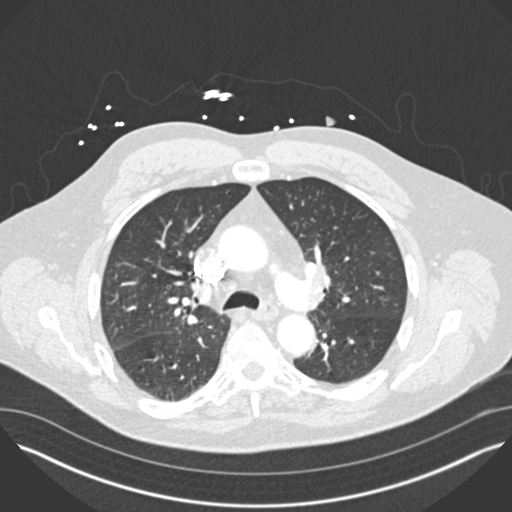
[im 213/311  mediastinal]
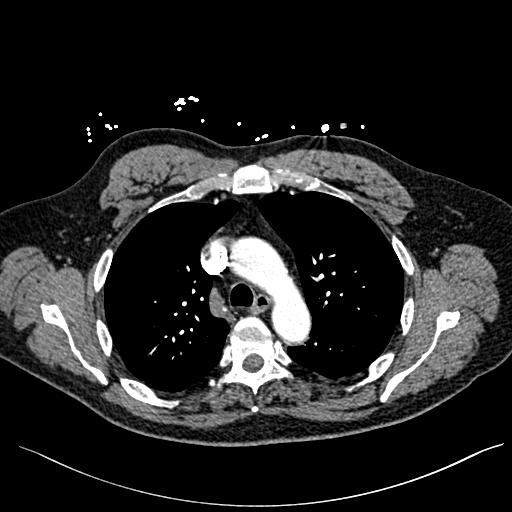
[im 229/311  lung]
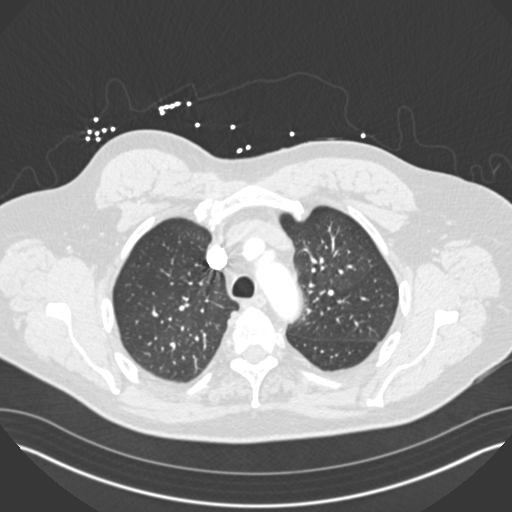
[im 245/311  mediastinal]
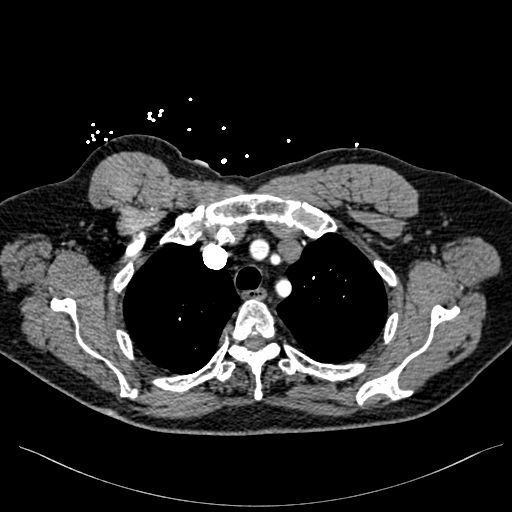
[im 262/311  lung]
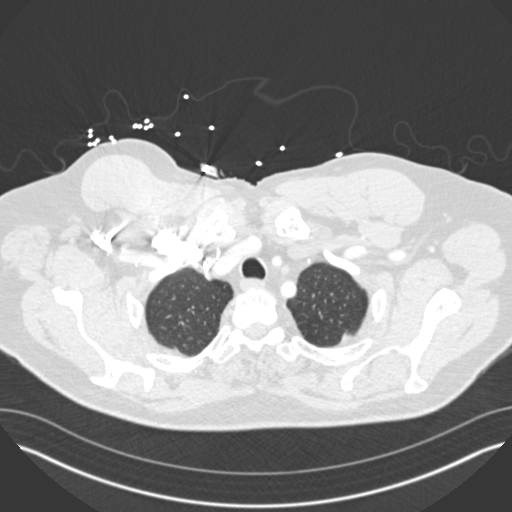
[im 278/311  mediastinal]
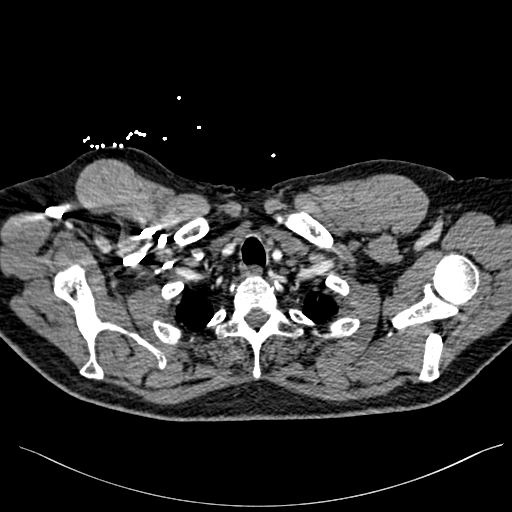
[im 294/311  lung]
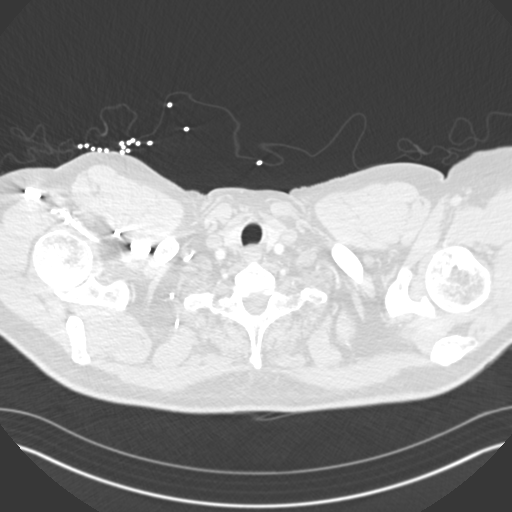

[18 of 36 positions shown; findings below may reference images not displayed]

FINDINGS: Cardiovascular: Heart is normal size. Calcified plaque over the
lateral circumflex coronary artery. Thoracic aorta is normal
caliber. Pulmonary arterial system is well opacified as there are
multiple emboli over the left proximal upper and lower lobar
arteries. Moderate emboli over the proximal right upper, middle and
lower lobar arteries. RV/LV= 49.3/46.8= 1.05. Remaining vascular
structures are unremarkable.

Mediastinum/Nodes: No evidence of mediastinal or hilar adenopathy.
Small to moderate size hiatal hernia. Remaining mediastinal
structures are unremarkable.

Lungs/Pleura: Lungs are adequately inflated. Consolidation over the
right lower lobe likely atelectasis or infarct. Airways are normal.

Upper Abdomen: Mild calcified plaque over the abdominal aorta. No
acute findings.

Musculoskeletal: Mild spondylosis of the spine. No focal
abnormality.

Review of the MIP images confirms the above findings.
IMPRESSION: 1. Moderate burden of acute bilateral pulmonary emboli.
Consolidation over the right lower lobe likely atelectasis or
infarct. Positive for acute PE with CTevidence of right heart strain
(RV/LV Ratio = 1.05) consistent with at least submassive
(intermediate risk) PE. The presence of right heart strain has been
associated with an increased risk of morbidity and mortality.
2. Aortic atherosclerosis. Atherosclerotic coronary artery disease.
3. Small to moderate size hiatal hernia.

Aortic Atherosclerosis ([id number]-K8X.X).

Critical Value/emergent results were called by telephone at the time
of interpretation on 08/01/2021 at [DATE] to provider Dr. Jaydon,
who verbally acknowledged these results.

## 2021-12-07 ENCOUNTER — Ambulatory Visit: Payer: Medicare HMO | Admitting: Cardiovascular Disease

## 2022-03-02 DIAGNOSIS — I2699 Other pulmonary embolism without acute cor pulmonale: Secondary | ICD-10-CM | POA: Diagnosis not present

## 2022-04-18 DIAGNOSIS — E785 Hyperlipidemia, unspecified: Secondary | ICD-10-CM | POA: Diagnosis not present

## 2022-04-18 DIAGNOSIS — Z809 Family history of malignant neoplasm, unspecified: Secondary | ICD-10-CM | POA: Diagnosis not present

## 2022-04-18 DIAGNOSIS — I2510 Atherosclerotic heart disease of native coronary artery without angina pectoris: Secondary | ICD-10-CM | POA: Diagnosis not present

## 2022-04-18 DIAGNOSIS — N529 Male erectile dysfunction, unspecified: Secondary | ICD-10-CM | POA: Diagnosis not present

## 2022-04-18 DIAGNOSIS — I252 Old myocardial infarction: Secondary | ICD-10-CM | POA: Diagnosis not present

## 2022-04-18 DIAGNOSIS — Z7982 Long term (current) use of aspirin: Secondary | ICD-10-CM | POA: Diagnosis not present

## 2022-04-18 DIAGNOSIS — Z96649 Presence of unspecified artificial hip joint: Secondary | ICD-10-CM | POA: Diagnosis not present

## 2022-04-18 DIAGNOSIS — I10 Essential (primary) hypertension: Secondary | ICD-10-CM | POA: Diagnosis not present

## 2022-05-12 ENCOUNTER — Telehealth: Payer: Self-pay | Admitting: *Deleted

## 2022-05-12 NOTE — Telephone Encounter (Signed)
   Pre-operative Risk Assessment    Patient Name: Leonard Rodriguez  DOB: 09/17/1945 MRN: [id number]      Request for Surgical Clearance    Procedure:   COLONOSCOPY; H/O COLON POLYPS  Date of Surgery:  Clearance 07/12/22                                 Surgeon:  DR. Dellia Cloud Surgeon's Group or Practice Name:  [facility] Phone number:  [phone] Fax number:  [phone]   Type of Clearance Requested:   - Medical ; NO MEDICATIONS LISTED AS NEEDING TO BE HELD    Type of Anesthesia:   PROPOFOL   Additional requests/questions:    Leonard Rodriguez   05/12/2022, 12:54 PM

## 2022-05-13 NOTE — Telephone Encounter (Signed)
Patient is currently scheduled to see Dr. Burt Knack for 1 year follow-up on 06/21/2022, his colonoscopy is currently scheduled for 07/12/2022, cardiac clearance can be addressed by MD during the next office visit

## 2022-05-24 ENCOUNTER — Encounter: Payer: Self-pay | Admitting: Orthopaedic Surgery

## 2022-05-24 ENCOUNTER — Ambulatory Visit: Payer: Medicare HMO | Admitting: Orthopaedic Surgery

## 2022-05-24 ENCOUNTER — Ambulatory Visit (INDEPENDENT_AMBULATORY_CARE_PROVIDER_SITE_OTHER): Payer: Medicare HMO

## 2022-05-24 DIAGNOSIS — Z96642 Presence of left artificial hip joint: Secondary | ICD-10-CM

## 2022-05-24 NOTE — Progress Notes (Signed)
The patient is now 11 months post a left total hip replacement.  He is doing well and has no complaints.  He has a right posterior hip replacement that was performed 12 years ago and that has had no issues either.  He is a very active 76 year old gentleman.  He ambulates normally without a limp.  Of note he is now off of Xarelto after having a postoperative DVT with PE.  He has no acute changes in medical status and no current issues.  Both hips move smoothly and fluidly with no difficulty at all.  His leg lengths are equal.  AP pelvis lateral left hip shows bilateral total hip arthroplasties that are well-seated with no evidence of loosening or complicating features.  At this point follow-up for his hip on the left side can be as needed.  If he does develop any issues with either hip he knows to let us know and we can always see him for any other orthopedic issues.  All questions and concerns were answered and addressed.

## 2022-06-21 ENCOUNTER — Ambulatory Visit: Payer: Medicare HMO | Attending: Cardiovascular Disease | Admitting: Cardiovascular Disease

## 2022-06-21 ENCOUNTER — Encounter: Payer: Self-pay | Admitting: Cardiovascular Disease

## 2022-06-21 VITALS — BP 120/76 | HR 60 | Ht 70.0 in | Wt 200.6 lb

## 2022-06-21 DIAGNOSIS — I2699 Other pulmonary embolism without acute cor pulmonale: Secondary | ICD-10-CM | POA: Diagnosis not present

## 2022-06-21 DIAGNOSIS — I2510 Atherosclerotic heart disease of native coronary artery without angina pectoris: Secondary | ICD-10-CM

## 2022-06-21 DIAGNOSIS — E782 Mixed hyperlipidemia: Secondary | ICD-10-CM

## 2022-06-21 DIAGNOSIS — I10 Essential (primary) hypertension: Secondary | ICD-10-CM

## 2022-06-21 DIAGNOSIS — Z01810 Encounter for preprocedural cardiovascular examination: Secondary | ICD-10-CM | POA: Diagnosis not present

## 2022-06-21 MED ORDER — ASPIRIN 81 MG PO TBEC: 81.0000 mg | DELAYED_RELEASE_TABLET | Freq: Every day | ORAL | 3 refills | Status: AC

## 2022-06-21 NOTE — Progress Notes (Signed)
Cardiology Office Note:    Date:  06/27/2022   ID:  Leonard Rodriguez, DOB 29-May-1946, MRN [id number]  PCP:  Lawerance Cruel, [facility] Cardiologist:  Sherren Mocha, MD     Referring MD: Lawerance Cruel, MD   Chief Complaint  Patient presents with   Coronary Artery Disease    History of Present Illness:    Leonard Rodriguez is a 76 y.o. male with a hx of coronary artery disease, presenting for follow-up evaluation.  The patient initially presented in 2016 with non-STEMI was found to have total occlusion of left circumflex treated with PCI using a drug-eluting stent.  He had nonobstructive CAD elsewhere and had mildly reduced LV systolic function with an LVEF of 50 to 55%.  Comorbid conditions include hypertension and hyperlipidemia as well as pulmonary embolus in November 2022 occurring after hip replacement surgery.  The patient is here alone today. He remains very active, walking 6-7 miles on Tuesdays and Thursdays. On mon, wed, fri, he walks 3-4 miles and does resistance training. Today, he denies symptoms of palpitations, chest pain, shortness of breath, orthopnea, PND, lower extremity edema, dizziness, or syncope. He stopped Xarelto in June after completing 6 months of anticoagulation for post-op pulmonary embolus. He is now back on ASA 81 mg daily.   Past Medical History:  Diagnosis Date   Coronary artery disease    a. 11/2014 NSTEMI/PCI: LM nl, LAD min irregs, D1 30-40, LCX 30-55m 100d (3.5x18 Resolute DES), OM1/2 nl, RCA 50p, R->L collats, EF 55%, mild MR.   Hyperlipidemia    Hypertension    put on bp meds. when he was heavier and was not exercising   Ischemic cardiomyopathy    Echo 3/16:  EF 45-50%, inf-lat and inf HK, Gr 2 DD, mod MR, mild LAE   Mitral regurgitation    Osteoarthritis of right hip    Recurrent upper respiratory infection (URI)    current cold being treated with otc meds.    Past Surgical History:  Procedure Laterality Date    CORONARY ANGIOPLASTY WITH STENT PLACEMENT  11/12/2014   "1"   JOINT REPLACEMENT     LEFT HEART CATHETERIZATION WITH CORONARY ANGIOGRAM N/A 11/12/2014   Procedure: LEFT HEART CATHETERIZATION WITH CORONARY ANGIOGRAM;  Surgeon: MBlane Ohara MD;  [facility];  Service: Cardiovascular;  Laterality: N/A;   TONSILLECTOMY  1950's   TOTAL HIP ARTHROPLASTY  08/30/2011   Procedure: TOTAL HIP ARTHROPLASTY;  Surgeon: PGarald Balding MD;  [facility]  Service: Orthopedics;  Laterality: Right;   TOTAL HIP ARTHROPLASTY Left 07/09/2021   Procedure: LEFT TOTAL HIP ARTHROPLASTY ANTERIOR APPROACH;  Surgeon: BMcarthur Rossetti MD;  Location: [facility];  Service: Orthopedics;  Laterality: Left;   VASECTOMY      Current Medications: Current Meds  Medication Sig   Ascorbic Acid (VITAMIN C) 1000 MG tablet Take 1,000 mg by mouth daily.   aspirin EC 81 MG tablet Take 1 tablet (81 mg total) by mouth daily. Swallow whole.   atorvastatin (LIPITOR) 80 MG tablet TAKE ONE TABLET BY MOUTH DAILY AT 6:00 IN THE EVENING   benazepril-hydrochlorthiazide (LOTENSIN HCT) 20-25 MG per tablet Take 1 tablet by mouth daily.   GAVILYTE-G 236 g solution SMARTSIG:Milliliter(s) By Mouth   metoprolol tartrate (LOPRESSOR) 25 MG tablet TAKE ONE TABLET BY MOUTH TWICE A DAY   Multiple Vitamins-Minerals (MULTIVITAMIN WITH MINERALS) tablet Take 1 tablet by mouth daily.    nitroGLYCERIN (NITROSTAT)  0.4 MG SL tablet Place 1 tablet (0.4 mg total) under the tongue every 5 (five) minutes x 3 doses as needed for chest pain.   Omega-3 Fatty Acids (FISH OIL) 1200 MG CAPS Take 1,200 mg by mouth daily.     Allergies:   Patient has no known allergies.   Social History   Socioeconomic History   Marital status: Married    Spouse name: Not on file   Number of children: Not on file   Years of education: Not on file   Highest education level: Not on file  Occupational History   Not on file  Tobacco Use   Smoking status:  Never   Smokeless tobacco: Never  Vaping Use   Vaping Use: Never used  Substance and Sexual Activity   Alcohol use: Yes    Comment: 11/12/2014 "beer maybe twice/month"   Drug use: No   Sexual activity: Not Currently  Other Topics Concern   Not on file  Social History Narrative   Not on file   Social Determinants of Health   Financial Resource Strain: Not on file  Food Insecurity: Not on file  Transportation Needs: Not on file  Physical Activity: Not on file  Stress: Not on file  Social Connections: Not on file     Family History: The patient's family history includes Lung cancer in his father. There is no history of Heart attack.  ROS:   Please see the history of present illness.    All other systems reviewed and are negative.  EKGs/Labs/Other Studies Reviewed:    The following studies were reviewed today: Echo 08/03/2021: 1. Left ventricular ejection fraction, by estimation, is 55 to 60%. The  left ventricle has normal function. The left ventricle has no regional  wall motion abnormalities. Left ventricular diastolic parameters are  consistent with Grade I diastolic  dysfunction (impaired relaxation).   2. Right ventricular systolic function is normal. The right ventricular  size is normal. Tricuspid regurgitation signal is inadequate for assessing  PA pressure.   3. The mitral valve is normal in structure. Trivial mitral valve  regurgitation. No evidence of mitral stenosis.   4. The aortic valve is tricuspid. Aortic valve regurgitation is not  visualized. Aortic valve sclerosis/calcification is present, without any  evidence of aortic stenosis.   5. Aortic dilatation noted. There is mild dilatation of the aortic root,  measuring 40 mm.   6. The inferior vena cava is normal in size with greater than 50%  respiratory variability, suggesting right atrial pressure of 3 mmHg.   EKG:  EKG is ordered today.  The ekg ordered today demonstrates NSR 60 bpm,  age-indeterminate inferior infarct. No change from baseline tracings.   Recent Labs: 08/01/2021: ALT 30 08/03/2021: BUN 13; Creatinine, Ser 0.88; Hemoglobin 10.7; Magnesium 2.1; Platelets 200; Potassium 3.6; Sodium 134  Recent Lipid Panel    Component Value Date/Time   CHOL 136 06/08/2021 1151   TRIG 145 08/01/2021 1813   HDL 32 (L) 06/08/2021 1151   CHOLHDL 4.3 06/08/2021 1151   CHOLHDL 3 01/14/2015 0922   VLDL 25.0 01/14/2015 0922   LDLCALC 70 06/08/2021 1151     Risk Assessment/Calculations:          Physical Exam:    VS:  BP 120/76   Pulse 60   Ht '5\' 10"'$  (1.778 m)   Wt 200 lb 9.6 oz (91 kg)   SpO2 94%   BMI 28.78 kg/m     Wt Readings from Last  3 Encounters:  06/21/22 200 lb 9.6 oz (91 kg)  09/10/21 191 lb (86.6 kg)  08/02/21 187 lb 1.6 oz (84.9 kg)     GEN:  Well nourished, well developed in no acute distress HEENT: Normal NECK: No JVD; No carotid bruits LYMPHATICS: No lymphadenopathy CARDIAC: RRR, no murmurs, rubs, gallops RESPIRATORY:  Clear to auscultation without rales, wheezing or rhonchi  ABDOMEN: Soft, non-tender, non-distended MUSCULOSKELETAL:  No edema; No deformity  SKIN: Warm and dry NEUROLOGIC:  Alert and oriented x 3 PSYCHIATRIC:  Normal affect   ASSESSMENT:    1. Coronary artery disease involving native coronary artery of native heart without angina pectoris   2. Mixed hyperlipidemia   3. Essential hypertension   4. Pulmonary embolism without acute cor pulmonale, unspecified chronicity, unspecified pulmonary embolism type ([place])   5. Preop cardiovascular exam    PLAN:    In order of problems listed above:  The patient continues to do very well with an excellent exercise regimen and no exertional symptoms.  His medicine program is reviewed and he will continue aspirin, atorvastatin, and metoprolol.  I will see him back in 1 year for follow-up evaluation. Lipids are excellent with a cholesterol 133, LDL 68, HDL 38.  He is treated with  atorvastatin 80 mg daily. Blood pressure is well controlled on a combination of benazepril, hydrochlorothiazide, and metoprolol. The patient completed 6 months of oral anticoagulation for a postoperative DVT/PE that occurred following hip surgery.  He is having no symptoms and he is back on aspirin 81 mg daily. The patient needs clearance for colonoscopy.  He is at low risk with excellent exercise tolerance, no cardiac-related symptoms, specifically no arrhythmia, no angina, and no symptoms of heart failure.  He can proceed without further testing.       Medication Adjustments/Labs and Tests Ordered: Current medicines are reviewed at length with the patient today.  Concerns regarding medicines are outlined above.  Orders Placed This Encounter  Procedures   EKG 12-Lead   Meds ordered this encounter  Medications   aspirin EC 81 MG tablet    Sig: Take 1 tablet (81 mg total) by mouth daily. Swallow whole.    Dispense:  90 tablet    Refill:  3    Patient Instructions  Medication Instructions:  Your physician recommends that you continue on your current medications as directed. Please refer to the Current Medication list given to you today.  *If you need a refill on your cardiac medications before your next appointment, please call your pharmacy*   Lab Work: NONE If you have labs (blood work) drawn today and your tests are completely normal, you will receive your results only by: [place] (if you have MyChart) OR A paper copy in the mail If you have any lab test that is abnormal or we need to change your treatment, we will call you to review the results.   Testing/Procedures: NONE   Follow-Up: At [facility], you and your health needs are our priority.  As part of our continuing mission to provide you with exceptional heart care, we have created designated Provider Care Teams.  These Care Teams include your primary Cardiologist (physician) and Advanced Practice  Providers (APPs -  Physician Assistants and Nurse Practitioners) who all work together to provide you with the care you need, when you need it.  We recommend signing up for the patient portal called "MyChart".  Sign up information is provided on this After Visit Summary.  MyChart  is used to connect with patients for Virtual Visits (Telemedicine).  Patients are able to view lab/test results, encounter notes, upcoming appointments, etc.  Non-urgent messages can be sent to your provider as well.   To learn more about what you can do with MyChart, go to [email or web address].    Your next appointment:   1 year(s)  The format for your next appointment:   In Person  Provider:   Sherren Mocha, MD  or APP     Important Information About Sugar         Signed, Sherren Mocha, MD  06/27/2022 1:45 PM    [place]

## 2022-06-21 NOTE — Patient Instructions (Signed)
Medication Instructions:  Your physician recommends that you continue on your current medications as directed. Please refer to the Current Medication list given to you today.  *If you need a refill on your cardiac medications before your next appointment, please call your pharmacy*   Lab Work: NONE If you have labs (blood work) drawn today and your tests are completely normal, you will receive your results only by: MyChart Message (if you have MyChart) OR A paper copy in the mail If you have any lab test that is abnormal or we need to change your treatment, we will call you to review the results.   Testing/Procedures: NONE   Follow-Up: At [facility], you and your health needs are our priority.  As part of our continuing mission to provide you with exceptional heart care, we have created designated Provider Care Teams.  These Care Teams include your primary Cardiologist (physician) and Advanced Practice Providers (APPs -  Physician Assistants and Nurse Practitioners) who all work together to provide you with the care you need, when you need it.  We recommend signing up for the patient portal called "MyChart".  Sign up information is provided on this After Visit Summary.  MyChart is used to connect with patients for Virtual Visits (Telemedicine).  Patients are able to view lab/test results, encounter notes, upcoming appointments, etc.  Non-urgent messages can be sent to your provider as well.   To learn more about what you can do with MyChart, go to [email or web address].    Your next appointment:   1 year(s)  The format for your next appointment:   In Person  Provider:   Michael Cooper, MD  or APP     Important Information About Sugar       

## 2022-06-27 NOTE — Progress Notes (Signed)
DR. Burt Knack OFFICE NOTES NOW REFLECT CARDIAC CLEARANCE, SEE PLAN: WILL FAX NOTES TO DR. Cristina Gong

## 2022-07-09 ENCOUNTER — Other Ambulatory Visit: Payer: Self-pay | Admitting: Cardiovascular Disease

## 2022-07-12 DIAGNOSIS — D120 Benign neoplasm of cecum: Secondary | ICD-10-CM | POA: Diagnosis not present

## 2022-07-12 DIAGNOSIS — D122 Benign neoplasm of ascending colon: Secondary | ICD-10-CM | POA: Diagnosis not present

## 2022-07-12 DIAGNOSIS — Z09 Encounter for follow-up examination after completed treatment for conditions other than malignant neoplasm: Secondary | ICD-10-CM | POA: Diagnosis not present

## 2022-07-12 DIAGNOSIS — K5730 Diverticulosis of large intestine without perforation or abscess without bleeding: Secondary | ICD-10-CM | POA: Diagnosis not present

## 2022-07-12 DIAGNOSIS — Z86010 Personal history of colonic polyps: Secondary | ICD-10-CM | POA: Diagnosis not present

## 2022-07-15 DIAGNOSIS — D120 Benign neoplasm of cecum: Secondary | ICD-10-CM | POA: Diagnosis not present

## 2022-07-15 DIAGNOSIS — D122 Benign neoplasm of ascending colon: Secondary | ICD-10-CM | POA: Diagnosis not present

## 2022-09-06 DIAGNOSIS — I10 Essential (primary) hypertension: Secondary | ICD-10-CM | POA: Diagnosis not present

## 2022-09-06 DIAGNOSIS — R7989 Other specified abnormal findings of blood chemistry: Secondary | ICD-10-CM | POA: Diagnosis not present

## 2022-09-06 DIAGNOSIS — E7800 Pure hypercholesterolemia, unspecified: Secondary | ICD-10-CM | POA: Diagnosis not present

## 2022-09-06 DIAGNOSIS — Z125 Encounter for screening for malignant neoplasm of prostate: Secondary | ICD-10-CM | POA: Diagnosis not present

## 2022-09-13 ENCOUNTER — Other Ambulatory Visit: Payer: Self-pay | Admitting: Cardiovascular Disease

## 2022-09-13 ENCOUNTER — Ambulatory Visit (INDEPENDENT_AMBULATORY_CARE_PROVIDER_SITE_OTHER): Payer: Medicare HMO

## 2022-09-13 ENCOUNTER — Encounter: Payer: Self-pay | Admitting: Podiatry

## 2022-09-13 ENCOUNTER — Ambulatory Visit: Payer: Medicare HMO | Admitting: Podiatry

## 2022-09-13 DIAGNOSIS — Z0000 Encounter for general adult medical examination without abnormal findings: Secondary | ICD-10-CM | POA: Diagnosis not present

## 2022-09-13 DIAGNOSIS — M7750 Other enthesopathy of unspecified foot: Secondary | ICD-10-CM

## 2022-09-13 DIAGNOSIS — Z125 Encounter for screening for malignant neoplasm of prostate: Secondary | ICD-10-CM | POA: Diagnosis not present

## 2022-09-13 DIAGNOSIS — M7751 Other enthesopathy of right foot: Secondary | ICD-10-CM

## 2022-09-13 DIAGNOSIS — E7800 Pure hypercholesterolemia, unspecified: Secondary | ICD-10-CM | POA: Diagnosis not present

## 2022-09-13 DIAGNOSIS — I10 Essential (primary) hypertension: Secondary | ICD-10-CM | POA: Diagnosis not present

## 2022-09-13 DIAGNOSIS — Z6828 Body mass index (BMI) 28.0-28.9, adult: Secondary | ICD-10-CM | POA: Diagnosis not present

## 2022-09-13 DIAGNOSIS — R7309 Other abnormal glucose: Secondary | ICD-10-CM | POA: Diagnosis not present

## 2022-09-13 NOTE — Patient Instructions (Signed)

## 2022-09-14 ENCOUNTER — Encounter: Payer: Self-pay | Admitting: Podiatry

## 2022-09-14 NOTE — Progress Notes (Signed)
  Subjective:  Patient ID: Leonard Rodriguez, male    DOB: 1945/10/28,  MRN: [id number]  Chief Complaint  Patient presents with   Foot Pain    NP- Right arch of his foot hurts    77 y.o. male presents with the above complaint. History confirmed with patient.  Started few weeks ago with severe in the heel spread into the arch and was very painful especially for steps in the morning, seems to be improving but has not gone away completely is now all under the heel  Objective:  Physical Exam: warm, good capillary refill, no trophic changes or ulcerative lesions, normal DP and PT pulses, and normal sensory exam. Left Foot: normal exam, no swelling, tenderness, instability; ligaments intact, full range of motion of all ankle/foot joints Right Foot: point tenderness over the heel pad  No images are attached to the encounter.  Radiographs: Multiple views x-ray of the right foot: no fracture, dislocation, swelling or degenerative changes noted and heel spur noted Assessment:   1. Tendonitis of ankle or foot      Plan:  Patient was evaluated and treated and all questions answered.  Discussed the etiology and treatment options for plantar fasciitis including stretching, formal physical therapy, supportive shoegears such as a running shoe or sneaker, pre fabricated orthoses, injection therapy, and oral medications. We also discussed the role of surgical treatment of this for patients who do not improve after exhausting non-surgical treatment options.   -XR reviewed with patient -Educated patient on stretching and icing of the affected limb -Injection delivered to the plantar fascia of the right foot.  After sterile prep with povidone-iodine solution and alcohol, the right heel was injected with 0.5cc 2% xylocaine plain, 0.5cc 0.5% marcaine plain, '5mg'$  triamcinolone acetonide, and '2mg'$  dexamethasone was injected along the medial plantar fascia at the insertion on the plantar calcaneus. The patient  tolerated the procedure well without complication.  Return if symptoms worsen or fail to improve.

## 2022-12-09 DIAGNOSIS — H52209 Unspecified astigmatism, unspecified eye: Secondary | ICD-10-CM | POA: Diagnosis not present

## 2022-12-09 DIAGNOSIS — H524 Presbyopia: Secondary | ICD-10-CM | POA: Diagnosis not present

## 2022-12-09 DIAGNOSIS — H5213 Myopia, bilateral: Secondary | ICD-10-CM | POA: Diagnosis not present

## 2023-03-06 ENCOUNTER — Ambulatory Visit: Payer: Medicare HMO | Admitting: Podiatry

## 2023-03-06 DIAGNOSIS — M722 Plantar fascial fibromatosis: Secondary | ICD-10-CM

## 2023-03-06 MED ORDER — TRIAMCINOLONE ACETONIDE 10 MG/ML IJ SUSP
10.0000 mg | Freq: Once | INTRAMUSCULAR | Status: AC
  Administered 2023-03-06: 10 mg

## 2023-03-07 NOTE — Progress Notes (Signed)
Subjective:   Patient ID: Leonard Rodriguez, male   DOB: 77 y.o.   MRN: [id number]   HPI Patient presents stating having a lot of pain in the right arch and states that it is worse when he gets up in the morning after periods of sitting   ROS      Objective:  Physical Exam  Neurovascular status intact with inflammation pain of the right medial arch with fluid buildup     Assessment:  Acute fasciitis right mid arch fascia with failure to properly stretch     Plan:  H&P reviewed sterile prep went ahead today injected the mid arch right 3 mg Kenalog 5 mg Xylocaine and applied night splint in order to stretch the arch properly and take pressure off the foot

## 2023-03-09 DIAGNOSIS — I252 Old myocardial infarction: Secondary | ICD-10-CM | POA: Diagnosis not present

## 2023-03-09 DIAGNOSIS — Z008 Encounter for other general examination: Secondary | ICD-10-CM | POA: Diagnosis not present

## 2023-03-09 DIAGNOSIS — I255 Ischemic cardiomyopathy: Secondary | ICD-10-CM | POA: Diagnosis not present

## 2023-03-09 DIAGNOSIS — Z8249 Family history of ischemic heart disease and other diseases of the circulatory system: Secondary | ICD-10-CM | POA: Diagnosis not present

## 2023-03-09 DIAGNOSIS — H269 Unspecified cataract: Secondary | ICD-10-CM | POA: Diagnosis not present

## 2023-03-09 DIAGNOSIS — E785 Hyperlipidemia, unspecified: Secondary | ICD-10-CM | POA: Diagnosis not present

## 2023-03-09 DIAGNOSIS — M1990 Unspecified osteoarthritis, unspecified site: Secondary | ICD-10-CM | POA: Diagnosis not present

## 2023-03-09 DIAGNOSIS — I10 Essential (primary) hypertension: Secondary | ICD-10-CM | POA: Diagnosis not present

## 2023-03-09 DIAGNOSIS — I700 Atherosclerosis of aorta: Secondary | ICD-10-CM | POA: Diagnosis not present

## 2023-03-09 DIAGNOSIS — I77819 Aortic ectasia, unspecified site: Secondary | ICD-10-CM | POA: Diagnosis not present

## 2023-03-09 DIAGNOSIS — I25119 Atherosclerotic heart disease of native coronary artery with unspecified angina pectoris: Secondary | ICD-10-CM | POA: Diagnosis not present

## 2023-06-09 ENCOUNTER — Other Ambulatory Visit: Payer: Self-pay | Admitting: Cardiovascular Disease

## 2023-07-13 NOTE — Progress Notes (Signed)
Cardiology Office Note:    Date:  07/14/2023   ID:  Leonard Rodriguez, DOB 12/07/1945, MRN [id number]  PCP:  Daisy Floro, MD   [facility] Providers Cardiologist:  Tonny Bollman, MD     Referring MD: Daisy Floro, MD   Chief Complaint  Patient presents with   Coronary Artery Disease    History of Present Illness:    Leonard Rodriguez is a 77 y.o. male with a hx of coronary artery disease, presenting for follow-up evaluation.  The patient initially presented in 2016 with non-STEMI found to have total occlusion of the left circumflex treated with PCI using a drug-eluting stent.  He otherwise had nonobstructive coronary artery disease and was noted to have mild LV dysfunction with an LVEF of 50 to 55%.  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  The patient is been physically active over time with no exertional symptoms.  The patient is here alone today.  He continues to do very well.  He goes to the gym for a few hours each day with no exertional symptoms.  He specifically denies any chest pain, chest pressure, or shortness of breath.  He denies edema, orthopnea, PND, or heart palpitations.  He is compliant with his medications.  He has his annual physical coming up in January.   Current Medications: Current Meds  Medication Sig   aspirin EC 81 MG tablet Take 1 tablet (81 mg total) by mouth daily. Swallow whole.   atorvastatin (LIPITOR) 80 MG tablet TAKE 1 TABLET BY MOUTH DAILY AT 6:00PM   benazepril-hydrochlorthiazide (LOTENSIN HCT) 20-25 MG per tablet Take 1 tablet by mouth daily.   metoprolol tartrate (LOPRESSOR) 25 MG tablet TAKE 1 TABLET BY MOUTH TWICE A DAY   Multiple Vitamins-Minerals (MULTIVITAMIN WITH MINERALS) tablet Take 1 tablet by mouth daily.    nitroGLYCERIN (NITROSTAT) 0.4 MG SL tablet Place 1 tablet (0.4 mg total) under the tongue every 5 (five) minutes x 3 doses  as needed for chest pain.   Omega-3 Fatty Acids (FISH OIL) 1200 MG CAPS Take 1,200 mg by mouth daily.     Allergies:   Patient has no known allergies.   ROS:   Please see the history of present illness.    All other systems reviewed and are negative.  EKGs/Labs/Other Studies Reviewed:    The following studies were reviewed today: Cardiac Studies & Procedures       ECHOCARDIOGRAM  ECHOCARDIOGRAM COMPLETE 08/03/2021  Narrative ECHOCARDIOGRAM REPORT    Patient Name:   Leonard Rodriguez Date of Exam: 08/03/2021 Medical Rec #:  [id number]      Height:       70.0 in Accession #:    [id number]     Weight:       187.1 lb Date of Birth:  [birth year]      BSA:          2.029 m Patient Age:    75 years       BP:           131/62 mmHg Patient Gender: M              HR:           70 bpm. Exam Location:  Inpatient  Procedure: 2D Echo, Cardiac Doppler, Color Doppler and Intracardiac Opacification Agent  Indications:    Pulmonary Embolus I26.09  History:        Patient has prior  history of Echocardiogram examinations, most recent 06/24/2021. Idiopathic CMP, CAD; Risk Factors:Hypertension and Dyslipidemia.  Sonographer:    Eulah Pont RDCS Referring Phys: [id number] RONDELL A SMITH  IMPRESSIONS   1. Left ventricular ejection fraction, by estimation, is 55 to 60%. The left ventricle has normal function. The left ventricle has no regional wall motion abnormalities. Left ventricular diastolic parameters are consistent with Grade I diastolic dysfunction (impaired relaxation). 2. Right ventricular systolic function is normal. The right ventricular size is normal. Tricuspid regurgitation signal is inadequate for assessing PA pressure. 3. The mitral valve is normal in structure. Trivial mitral valve regurgitation. No evidence of mitral stenosis. 4. The aortic valve is tricuspid. Aortic valve regurgitation is not visualized. Aortic valve sclerosis/calcification is present, without any evidence  of aortic stenosis. 5. Aortic dilatation noted. There is mild dilatation of the aortic root, measuring 40 mm. 6. The inferior vena cava is normal in size with greater than 50% respiratory variability, suggesting right atrial pressure of 3 mmHg.  FINDINGS Left Ventricle: Left ventricular ejection fraction, by estimation, is 55 to 60%. The left ventricle has normal function. The left ventricle has no regional wall motion abnormalities. Definity contrast agent was given IV to delineate the left ventricular endocardial borders. The left ventricular internal cavity size was normal in size. There is no left ventricular hypertrophy. Left ventricular diastolic parameters are consistent with Grade I diastolic dysfunction (impaired relaxation).  Right Ventricle: The right ventricular size is normal. No increase in right ventricular wall thickness. Right ventricular systolic function is normal. Tricuspid regurgitation signal is inadequate for assessing PA pressure.  Left Atrium: Left atrial size was normal in size.  Right Atrium: Right atrial size was normal in size.  Pericardium: There is no evidence of pericardial effusion.  Mitral Valve: The mitral valve is normal in structure. Trivial mitral valve regurgitation. No evidence of mitral valve stenosis.  Tricuspid Valve: The tricuspid valve is normal in structure. Tricuspid valve regurgitation is trivial.  Aortic Valve: The aortic valve is tricuspid. Aortic valve regurgitation is not visualized. Aortic valve sclerosis/calcification is present, without any evidence of aortic stenosis.  Pulmonic Valve: The pulmonic valve was normal in structure. Pulmonic valve regurgitation is trivial.  Aorta: Aortic dilatation noted. There is mild dilatation of the aortic root, measuring 40 mm.  Venous: The inferior vena cava is normal in size with greater than 50% respiratory variability, suggesting right atrial pressure of 3 mmHg.  IAS/Shunts: No atrial level shunt  detected by color flow Doppler.   LEFT VENTRICLE PLAX 2D LVIDd:         4.60 cm   Diastology LVIDs:         2.90 cm   LV e' medial:    6.43 cm/s LV PW:         1.00 cm   LV E/e' medial:  8.2 LV IVS:        0.80 cm   LV e' lateral:   8.31 cm/s LVOT diam:     2.20 cm   LV E/e' lateral: 6.3 LV SV:         68 LV SV Index:   33 LVOT Area:     3.80 cm   RIGHT VENTRICLE RV S prime:     12.30 cm/s TAPSE (M-mode): 1.7 cm  LEFT ATRIUM             Index        RIGHT ATRIUM           Index  LA diam:        2.70 cm 1.33 cm/m   RA Area:     13.20 cm LA Vol (A2C):   37.9 ml 18.68 ml/m  RA Volume:   29.20 ml  14.39 ml/m LA Vol (A4C):   27.8 ml 13.70 ml/m LA Biplane Vol: 34.1 ml 16.81 ml/m AORTIC VALVE LVOT Vmax:   106.00 cm/s LVOT Vmean:  68.600 cm/s LVOT VTI:    0.178 m  AORTA Ao Root diam: 4.00 cm Ao Asc diam:  3.80 cm  MITRAL VALVE MV Area (PHT): 3.21 cm    SHUNTS MV Decel Time: 236 msec    Systemic VTI:  0.18 m MV E velocity: 52.70 cm/s  Systemic Diam: 2.20 cm MV A velocity: 86.10 cm/s MV E/A ratio:  0.61  Dalton McleanMD Electronically signed by Wilfred Lacy Signature Date/Time: 08/03/2021/3:47:32 PM    Final             EKG:   EKG Interpretation Date/Time:  Friday July 14 2023 10:23:11 EDT Ventricular Rate:  60 PR Interval:  182 QRS Duration:  92 QT Interval:  400 QTC Calculation: 400 R Axis:   52  Text Interpretation: Normal sinus rhythm Normal ECG When compared with ECG of 01-Aug-2021 13:55, PREVIOUS ECG IS PRESENT No significant change was found Confirmed by Tonny Bollman [phone]) on 07/14/2023 10:33:54 AM    Recent Labs: No results found for requested labs within last 365 days.  Recent Lipid Panel    Component Value Date/Time   CHOL 136 06/08/2021 1151   TRIG 145 08/01/2021 1813   HDL 32 (L) 06/08/2021 1151   CHOLHDL 4.3 06/08/2021 1151   CHOLHDL 3 01/14/2015 0922   VLDL 25.0 01/14/2015 0922   LDLCALC 70 06/08/2021 1151     Risk  Assessment/Calculations:                Physical Exam:    VS:  BP 110/70   Pulse 60   Ht 5\' 10"  (1.778 m)   Wt 197 lb 12.8 oz (89.7 kg)   SpO2 97%   BMI 28.38 kg/m     Wt Readings from Last 3 Encounters:  07/14/23 197 lb 12.8 oz (89.7 kg)  06/21/22 200 lb 9.6 oz (91 kg)  09/10/21 191 lb (86.6 kg)     GEN:  Well nourished, well developed in no acute distress HEENT: Normal NECK: No JVD; No carotid bruits LYMPHATICS: No lymphadenopathy CARDIAC: RRR, no murmurs, rubs, gallops RESPIRATORY:  Clear to auscultation without rales, wheezing or rhonchi  ABDOMEN: Soft, non-tender, non-distended MUSCULOSKELETAL:  No edema; No deformity  SKIN: Warm and dry NEUROLOGIC:  Alert and oriented x 3 PSYCHIATRIC:  Normal affect   Assessment & Plan Coronary artery disease involving native coronary artery of native heart without angina pectoris The patient remains asymptomatic.  His LDL cholesterol is 47.  He continues on aspirin for antiplatelet therapy and high intensity statin drug. Mixed hyperlipidemia His lipids are at goal.  LDL is 47, total cholesterol 113.  Continue atorvastatin 80 mg daily. Essential hypertension Blood pressure well-controlled on benazepril, hydrochlorothiazide, and metoprolol.      Medication Adjustments/Labs and Tests Ordered: Current medicines are reviewed at length with the patient today.  Concerns regarding medicines are outlined above.  Orders Placed This Encounter  Procedures   EKG 12-Lead   No orders of the defined types were placed in this encounter.   Patient Instructions  Follow-Up: At [facility], you and your health needs are  our priority.  As part of our continuing mission to provide you with exceptional heart care, we have created designated Provider Care Teams.  These Care Teams include your primary Cardiologist (physician) and Advanced Practice Providers (APPs -  Physician Assistants and Nurse Practitioners) who all work together to  provide you with the care you need, when you need it.  Your next appointment:   1 year(s)  Provider:   Tonny Bollman, MD        Signed, Tonny Bollman, MD  07/14/2023 1:28 PM    [facility]

## 2023-07-14 ENCOUNTER — Encounter: Payer: Self-pay | Admitting: Cardiovascular Disease

## 2023-07-14 ENCOUNTER — Ambulatory Visit: Payer: Medicare HMO | Attending: Cardiovascular Disease | Admitting: Cardiovascular Disease

## 2023-07-14 VITALS — BP 110/70 | HR 60 | Ht 70.0 in | Wt 197.8 lb

## 2023-07-14 DIAGNOSIS — I10 Essential (primary) hypertension: Secondary | ICD-10-CM

## 2023-07-14 DIAGNOSIS — E782 Mixed hyperlipidemia: Secondary | ICD-10-CM | POA: Diagnosis not present

## 2023-07-14 DIAGNOSIS — I2510 Atherosclerotic heart disease of native coronary artery without angina pectoris: Secondary | ICD-10-CM

## 2023-07-14 NOTE — Assessment & Plan Note (Signed)
The patient remains asymptomatic.  His LDL cholesterol is 47.  He continues on aspirin for antiplatelet therapy and high intensity statin drug.

## 2023-07-14 NOTE — Patient Instructions (Signed)

## 2023-07-14 NOTE — Assessment & Plan Note (Signed)
His lipids are at goal.  LDL is 47, total cholesterol 113.  Continue atorvastatin 80 mg daily.

## 2023-09-21 DIAGNOSIS — N529 Male erectile dysfunction, unspecified: Secondary | ICD-10-CM | POA: Diagnosis not present

## 2023-09-21 DIAGNOSIS — R7303 Prediabetes: Secondary | ICD-10-CM | POA: Diagnosis not present

## 2023-09-21 DIAGNOSIS — I10 Essential (primary) hypertension: Secondary | ICD-10-CM | POA: Diagnosis not present

## 2023-09-21 DIAGNOSIS — Z0000 Encounter for general adult medical examination without abnormal findings: Secondary | ICD-10-CM | POA: Diagnosis not present

## 2023-09-21 DIAGNOSIS — E7800 Pure hypercholesterolemia, unspecified: Secondary | ICD-10-CM | POA: Diagnosis not present

## 2023-11-11 DIAGNOSIS — H52209 Unspecified astigmatism, unspecified eye: Secondary | ICD-10-CM | POA: Diagnosis not present

## 2023-11-11 DIAGNOSIS — H524 Presbyopia: Secondary | ICD-10-CM | POA: Diagnosis not present

## 2023-11-11 DIAGNOSIS — H5213 Myopia, bilateral: Secondary | ICD-10-CM | POA: Diagnosis not present

## 2024-01-18 ENCOUNTER — Other Ambulatory Visit: Payer: Self-pay | Admitting: Cardiovascular Disease

## 2024-02-15 ENCOUNTER — Other Ambulatory Visit: Payer: Self-pay

## 2024-02-15 ENCOUNTER — Telehealth: Payer: Self-pay | Admitting: Cardiovascular Disease

## 2024-02-15 MED ORDER — METOPROLOL TARTRATE 25 MG PO TABS: 25.0000 mg | ORAL_TABLET | Freq: Two times a day (BID) | ORAL | 5 refills | Status: DC

## 2024-02-15 MED ORDER — ATORVASTATIN CALCIUM 80 MG PO TABS: 80.0000 mg | ORAL_TABLET | Freq: Every day | ORAL | 5 refills | Status: DC

## 2024-02-15 MED ORDER — ATORVASTATIN CALCIUM 80 MG PO TABS: 80.0000 mg | ORAL_TABLET | Freq: Every day | ORAL | 1 refills | Status: AC

## 2024-02-15 MED ORDER — METOPROLOL TARTRATE 25 MG PO TABS: 25.0000 mg | ORAL_TABLET | Freq: Two times a day (BID) | ORAL | 1 refills | Status: AC

## 2024-02-15 NOTE — Telephone Encounter (Signed)
*  STAT* If patient is at the pharmacy, call can be transferred to refill team.   1. Which medications need to be refilled? (please list name of each medication and dose if known)  atorvastatin  (LIPITOR ) 80 MG tablet  metoprolol  tartrate (LOPRESSOR ) 25 MG tablet    2. Would you like to learn more about the convenience, safety, & potential cost savings by using the [facility]?     3. Are you open to using the [facility] (Type [facility].  ).   4. Which pharmacy/location (including street and city if local pharmacy) is medication to be sent to?[facility] [id number] - [place], [place] - [address]    5. Do they need a 30 day or 90 day supply? 90 day  Patient wants 90 day supply

## 2024-02-15 NOTE — Telephone Encounter (Signed)
 Pt's medications were sent to pt's pharmacy as requested. Confirmation received.

## 2024-08-02 ENCOUNTER — Encounter: Payer: Self-pay | Admitting: Cardiovascular Disease

## 2024-08-02 ENCOUNTER — Ambulatory Visit: Attending: Cardiovascular Disease | Admitting: Cardiovascular Disease

## 2024-08-02 VITALS — BP 120/74 | HR 55 | Ht 70.0 in | Wt 199.4 lb

## 2024-08-02 DIAGNOSIS — E782 Mixed hyperlipidemia: Secondary | ICD-10-CM

## 2024-08-02 DIAGNOSIS — I2510 Atherosclerotic heart disease of native coronary artery without angina pectoris: Secondary | ICD-10-CM

## 2024-08-02 DIAGNOSIS — I10 Essential (primary) hypertension: Secondary | ICD-10-CM | POA: Diagnosis not present

## 2024-08-02 NOTE — Assessment & Plan Note (Signed)
 Continue current management with aspirin  and atorvastatin .  Follow-up 1 year.  Today's EKG is reviewed and shows no significant changes.

## 2024-08-02 NOTE — Patient Instructions (Signed)

## 2024-08-02 NOTE — Progress Notes (Signed)
 Cardiology Office Note:    Date:  08/02/2024   ID:  Leonard Rodriguez, DOB 1945/12/01, MRN [id number]  PCP:  Okey Carlin Redbird, MD   [facility] Providers Cardiologist:  Ozell Fell, MD     Referring MD: Okey Carlin Redbird, MD   Chief Complaint  Patient presents with   Coronary Artery Disease    History of Present Illness:    Leonard Rodriguez is a 78 y.o. male with a hx of coronary artery disease, presenting for follow-up evaluation. The patient initially presented in 2016 with non-STEMI found to have total occlusion of the left circumflex treated with PCI using a drug-eluting stent. He otherwise had nonobstructive coronary artery disease and was noted to have mild LV dysfunction with an LVEF of 50 to 55%. 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 The patient is been physically active over time with no exertional symptoms.   Today, he denies symptoms of palpitations, chest pain, shortness of breath, orthopnea, PND, lower extremity edema, dizziness, or syncope. He continues to exercise regularly with no exertional symptoms.  He is here today and all of his [employer] care and is enjoying the great football season that they are having.  Current Medications: Current Meds  Medication Sig   aspirin  EC 81 MG tablet Take 1 tablet (81 mg total) by mouth daily. Swallow whole.   atorvastatin  (LIPITOR ) 80 MG tablet Take 1 tablet (80 mg total) by mouth daily at 6 PM.   benazepril -hydrochlorthiazide (LOTENSIN  HCT) 20-25 MG per tablet Take 1 tablet by mouth daily.   metoprolol  tartrate (LOPRESSOR ) 25 MG tablet Take 1 tablet (25 mg total) by mouth 2 (two) times daily.   Multiple Vitamins-Minerals (MULTIVITAMIN WITH MINERALS) tablet Take 1 tablet by mouth daily.    nitroGLYCERIN  (NITROSTAT ) 0.4 MG SL tablet Place 1 tablet (0.4 mg total) under the tongue every 5 (five) minutes x 3 doses as  needed for chest pain.   Omega-3 Fatty Acids (FISH OIL) 1200 MG CAPS Take 1,200 mg by mouth daily.     Allergies:   Patient has no known allergies.   ROS:   Please see the history of present illness.    All other systems reviewed and are negative.  EKGs/Labs/Other Studies Reviewed:    The following studies were reviewed today: Cardiac Studies & Procedures   ______________________________________________________________________________________________     ECHOCARDIOGRAM  ECHOCARDIOGRAM COMPLETE 08/03/2021  Narrative ECHOCARDIOGRAM REPORT    Patient Name:   Leonard Rodriguez Date of Exam: 08/03/2021 Medical Rec #:  [id number]      Height:       70.0 in Accession #:    [id number]     Weight:       187.1 lb Date of Birth:  [birth year]      BSA:          2.029 m Patient Age:    75 years       BP:           131/62 mmHg Patient Gender: M              HR:           70 bpm. Exam Location:  Inpatient  Procedure: 2D Echo, Cardiac Doppler, Color Doppler and Intracardiac Opacification Agent  Indications:    Pulmonary Embolus I26.09  History:        Patient has prior history of Echocardiogram examinations, most recent 06/24/2021. Idiopathic CMP, CAD; Risk  Factors:Hypertension and Dyslipidemia.  Sonographer:    Lauraine Pilot RDCS Referring Phys: [id number] RONDELL A SMITH  IMPRESSIONS   1. Left ventricular ejection fraction, by estimation, is 55 to 60%. The left ventricle has normal function. The left ventricle has no regional wall motion abnormalities. Left ventricular diastolic parameters are consistent with Grade I diastolic dysfunction (impaired relaxation). 2. Right ventricular systolic function is normal. The right ventricular size is normal. Tricuspid regurgitation signal is inadequate for assessing PA pressure. 3. The mitral valve is normal in structure. Trivial mitral valve regurgitation. No evidence of mitral stenosis. 4. The aortic valve is tricuspid. Aortic valve  regurgitation is not visualized. Aortic valve sclerosis/calcification is present, without any evidence of aortic stenosis. 5. Aortic dilatation noted. There is mild dilatation of the aortic root, measuring 40 mm. 6. The inferior vena cava is normal in size with greater than 50% respiratory variability, suggesting right atrial pressure of 3 mmHg.  FINDINGS Left Ventricle: Left ventricular ejection fraction, by estimation, is 55 to 60%. The left ventricle has normal function. The left ventricle has no regional wall motion abnormalities. Definity  contrast agent was given IV to delineate the left ventricular endocardial borders. The left ventricular internal cavity size was normal in size. There is no left ventricular hypertrophy. Left ventricular diastolic parameters are consistent with Grade I diastolic dysfunction (impaired relaxation).  Right Ventricle: The right ventricular size is normal. No increase in right ventricular wall thickness. Right ventricular systolic function is normal. Tricuspid regurgitation signal is inadequate for assessing PA pressure.  Left Atrium: Left atrial size was normal in size.  Right Atrium: Right atrial size was normal in size.  Pericardium: There is no evidence of pericardial effusion.  Mitral Valve: The mitral valve is normal in structure. Trivial mitral valve regurgitation. No evidence of mitral valve stenosis.  Tricuspid Valve: The tricuspid valve is normal in structure. Tricuspid valve regurgitation is trivial.  Aortic Valve: The aortic valve is tricuspid. Aortic valve regurgitation is not visualized. Aortic valve sclerosis/calcification is present, without any evidence of aortic stenosis.  Pulmonic Valve: The pulmonic valve was normal in structure. Pulmonic valve regurgitation is trivial.  Aorta: Aortic dilatation noted. There is mild dilatation of the aortic root, measuring 40 mm.  Venous: The inferior vena cava is normal in size with greater than 50%  respiratory variability, suggesting right atrial pressure of 3 mmHg.  IAS/Shunts: No atrial level shunt detected by color flow Doppler.   LEFT VENTRICLE PLAX 2D LVIDd:         4.60 cm   Diastology LVIDs:         2.90 cm   LV e' medial:    6.43 cm/s LV PW:         1.00 cm   LV E/e' medial:  8.2 LV IVS:        0.80 cm   LV e' lateral:   8.31 cm/s LVOT diam:     2.20 cm   LV E/e' lateral: 6.3 LV SV:         68 LV SV Index:   33 LVOT Area:     3.80 cm   RIGHT VENTRICLE RV S prime:     12.30 cm/s TAPSE (M-mode): 1.7 cm  LEFT ATRIUM             Index        RIGHT ATRIUM           Index LA diam:        2.70 cm  1.33 cm/m   RA Area:     13.20 cm LA Vol (A2C):   37.9 ml 18.68 ml/m  RA Volume:   29.20 ml  14.39 ml/m LA Vol (A4C):   27.8 ml 13.70 ml/m LA Biplane Vol: 34.1 ml 16.81 ml/m AORTIC VALVE LVOT Vmax:   106.00 cm/s LVOT Vmean:  68.600 cm/s LVOT VTI:    0.178 m  AORTA Ao Root diam: 4.00 cm Ao Asc diam:  3.80 cm  MITRAL VALVE MV Area (PHT): 3.21 cm    SHUNTS MV Decel Time: 236 msec    Systemic VTI:  0.18 m MV E velocity: 52.70 cm/s  Systemic Diam: 2.20 cm MV A velocity: 86.10 cm/s MV E/A ratio:  0.61  Dalton McleanMD Electronically signed by Ezra Kanner Signature Date/Time: 08/03/2021/3:47:32 PM    Final          ______________________________________________________________________________________________      EKG:   EKG Interpretation Date/Time:  Friday August 02 2024 10:27:42 EST Ventricular Rate:  55 PR Interval:  184 QRS Duration:  88 QT Interval:  414 QTC Calculation: 396 R Axis:   37  Text Interpretation: Sinus bradycardia Inferior infarct , age undetermined When compared with ECG of 14-Jul-2023 10:23, No significant change was found Confirmed by Wonda Sharper [phone]) on 08/02/2024 10:50:50 AM    Recent Labs: No results found for requested labs within last 365 days.  Recent Lipid Panel    Component Value Date/Time   CHOL 136  06/08/2021 1151   TRIG 145 08/01/2021 1813   HDL 32 (L) 06/08/2021 1151   CHOLHDL 4.3 06/08/2021 1151   CHOLHDL 3 01/14/2015 0922   VLDL 25.0 01/14/2015 0922   LDLCALC 70 06/08/2021 1151     Risk Assessment/Calculations:                Physical Exam:    VS:  BP 120/74 (BP Location: Left Arm, Patient Position: Sitting, Cuff Size: Normal)   Pulse (!) 55   Ht 5' 10 (1.778 m)   Wt 199 lb 6.4 oz (90.4 kg)   SpO2 95%   BMI 28.61 kg/m     Wt Readings from Last 3 Encounters:  08/02/24 199 lb 6.4 oz (90.4 kg)  07/14/23 197 lb 12.8 oz (89.7 kg)  06/21/22 200 lb 9.6 oz (91 kg)     GEN:  Well nourished, well developed in no acute distress HEENT: Normal NECK: No JVD; No carotid bruits LYMPHATICS: No lymphadenopathy CARDIAC: RRR, no murmurs, rubs, gallops RESPIRATORY:  Clear to auscultation without rales, wheezing or rhonchi  ABDOMEN: Soft, non-tender, non-distended MUSCULOSKELETAL:  No edema; No deformity  SKIN: Warm and dry NEUROLOGIC:  Alert and oriented x 3 PSYCHIATRIC:  Normal affect   Assessment & Plan Coronary artery disease involving native coronary artery of native heart without angina pectoris Continue current management with aspirin  and atorvastatin .  Follow-up 1 year.  Today's EKG is reviewed and shows no significant changes. Mixed hyperlipidemia Treated with atorvastatin  80 mg daily.  LDL cholesterol is 67, followed by PCP. Essential hypertension Blood pressure is well-controlled on benazepril /hydrochlorothiazide  and metoprolol .  Continue the same.        Medication Adjustments/Labs and Tests Ordered: Current medicines are reviewed at length with the patient today.  Concerns regarding medicines are outlined above.  Orders Placed This Encounter  Procedures   EKG 12-Lead   No orders of the defined types were placed in this encounter.   Patient Instructions  Medication Instructions:  No medication changes were made  at this visit. Continue current  regimen.   *If you need a refill on your cardiac medications before your next appointment, please call your pharmacy*  Lab Work: None ordered today. If you have labs (blood work) drawn today and your tests are completely normal, you will receive your results only by: MyChart Message (if you have MyChart) OR A paper copy in the mail If you have any lab test that is abnormal or we need to change your treatment, we will call you to review the results.  Testing/Procedures: None ordered today.  Follow-Up: At [facility], you and your health needs are our priority.  As part of our continuing mission to provide you with exceptional heart care, our providers are all part of one team.  This team includes your primary Cardiologist (physician) and Advanced Practice Providers or APPs (Physician Assistants and Nurse Practitioners) who all work together to provide you with the care you need, when you need it.  Your next appointment:   1 year(s)  Provider:   Ozell Fell, MD      Signed, Ozell Fell, MD  08/02/2024 1:01 PM    [facility]

## 2024-08-02 NOTE — Assessment & Plan Note (Signed)
 Treated with atorvastatin  80 mg daily.  LDL cholesterol is 67, followed by PCP.
